# Patient Record
Sex: Male | Born: 1955 | Hispanic: Yes | Marital: Married | Smoking: Former smoker
Health system: Southern US, Community
[De-identification: ages and names within clinical notes are randomized; demographics above are authoritative.]

## PROBLEM LIST (undated history)

## (undated) HISTORY — PX: JOINT REPLACEMENT: SHX530

---

## 2007-03-02 ENCOUNTER — Ambulatory Visit: Payer: Self-pay | Admitting: Family Medicine

## 2012-05-23 ENCOUNTER — Emergency Department: Payer: Self-pay | Admitting: Unknown Physician Specialty

## 2012-05-23 LAB — URINALYSIS, COMPLETE
Bilirubin,UR: NEGATIVE
Glucose,UR: NEGATIVE mg/dL (ref 0–75)
Hyaline Cast: 7
Leukocyte Esterase: NEGATIVE
Nitrite: NEGATIVE
Ph: 6 (ref 4.5–8.0)
Specific Gravity: 1.033 (ref 1.003–1.030)
Squamous Epithelial: NONE SEEN

## 2012-05-23 LAB — COMPREHENSIVE METABOLIC PANEL
Alkaline Phosphatase: 104 U/L (ref 50–136)
Anion Gap: 4 — ABNORMAL LOW (ref 7–16)
Calcium, Total: 9.3 mg/dL (ref 8.5–10.1)
Co2: 34 mmol/L — ABNORMAL HIGH (ref 21–32)
Creatinine: 0.86 mg/dL (ref 0.60–1.30)
EGFR (Non-African Amer.): >60
Osmolality: 275 (ref 275–301)
SGOT(AST): 38 U/L — ABNORMAL HIGH (ref 15–37)
SGPT (ALT): 37 U/L (ref 12–78)
Sodium: 135 mmol/L — ABNORMAL LOW (ref 136–145)

## 2012-05-23 LAB — LIPASE, BLOOD: Lipase: 206 U/L (ref 73–393)

## 2012-05-23 LAB — CBC
HCT: 52.5 % — ABNORMAL HIGH (ref 40.0–52.0)
HGB: 17.5 g/dL (ref 13.0–18.0)
MCH: 27 pg (ref 26.0–34.0)
MCHC: 33.3 g/dL (ref 32.0–36.0)
MCV: 81 fL (ref 80–100)
Platelet: 237 10*3/uL (ref 150–440)

## 2012-05-23 LAB — MAGNESIUM: Magnesium: 3.2 mg/dL — ABNORMAL HIGH

## 2012-05-23 LAB — TROPONIN I: Troponin-I: <0.02 ng/mL

## 2013-05-09 ENCOUNTER — Emergency Department: Payer: Self-pay | Admitting: Emergency Medicine

## 2013-05-09 LAB — CBC
HCT: 46.4 % (ref 40.0–52.0)
HGB: 15.2 g/dL (ref 13.0–18.0)
MCH: 26.8 pg (ref 26.0–34.0)
MCHC: 32.7 g/dL (ref 32.0–36.0)
MCV: 82 fL (ref 80–100)
Platelet: 300 10*3/uL (ref 150–440)
RDW: 14.2 % (ref 11.5–14.5)
WBC: 11.5 10*3/uL — ABNORMAL HIGH (ref 3.8–10.6)

## 2013-05-09 LAB — COMPREHENSIVE METABOLIC PANEL
Anion Gap: 14 (ref 7–16)
Bilirubin,Total: 0.3 mg/dL (ref 0.2–1.0)
Chloride: 103 mmol/L (ref 98–107)
Co2: 21 mmol/L (ref 21–32)
EGFR (Non-African Amer.): 52 — ABNORMAL LOW
Glucose: 237 mg/dL — ABNORMAL HIGH (ref 65–99)
Osmolality: 282 (ref 275–301)
SGPT (ALT): 104 U/L — ABNORMAL HIGH (ref 12–78)
Sodium: 138 mmol/L (ref 136–145)
Total Protein: 8.6 g/dL — ABNORMAL HIGH (ref 6.4–8.2)

## 2013-08-16 ENCOUNTER — Encounter: Payer: Self-pay | Admitting: Orthopedic Surgery

## 2013-09-12 ENCOUNTER — Encounter: Payer: Self-pay | Admitting: Orthopedic Surgery

## 2014-05-05 ENCOUNTER — Emergency Department: Payer: Self-pay | Admitting: Emergency Medicine

## 2014-07-15 HISTORY — PX: DG LEFT WRIST COMPLETE (ARMC HX): HXRAD1564

## 2015-03-14 ENCOUNTER — Emergency Department
Admission: EM | Admit: 2015-03-14 | Discharge: 2015-03-14 | Disposition: A | Discharge status: Home / Self Care | Payer: PRIVATE HEALTH INSURANCE | Attending: Emergency Medicine | Admitting: Emergency Medicine

## 2015-03-14 ENCOUNTER — Encounter: Payer: Self-pay | Admitting: Emergency Medicine

## 2015-03-14 DIAGNOSIS — I83023 Varicose veins of left lower extremity with ulcer of ankle: Secondary | ICD-10-CM | POA: Diagnosis present

## 2015-03-14 DIAGNOSIS — L97329 Non-pressure chronic ulcer of left ankle with unspecified severity: Secondary | ICD-10-CM

## 2015-03-14 NOTE — ED Notes (Addendum)
Non adhesive dressing and kerlex applied to left ankle. Per verbal order Marisue Humble, PA-C

## 2015-03-14 NOTE — Discharge Instructions (Signed)
Venas varicosas sangrantes  (Bleeding Varicose Veins) Las venas varicosas son venas que se han agrandado y se tornan sinuosas. Las vlvulas de las venas ayudan al retorno de la sangre desde las piernas hacia el corazn. Si estas vlvulas sufren una lesin, el flujo sanguneo retorna hacia atrs y [employer] venas de la pierna, cerca de la piel. Esto hace que las venas se agranden debido al aumento de la presin interior. En algunos casos las venas sangran.  CAUSAS  Los factores que pueden causar sangrado en las venas varicosas son:   Adelgazamiento de la piel que cubre las venas. Esta piel se estira a medida que las venas se agrandan.  Debilidad y adelgazamiento de las paredes de las venas varicosas. Estas paredes delgadas son Neomia Dear de las causas por las que la sangre no fluye normalmente hacia corazn.  Presin alta en las venas. La alta presin se debe a que la sangre no fluye libremente Special educational needs teacher.  Traumatismos. Incluso una pequea lesin en una vena varicosa puede producir sangrado.  Heridas abiertas. En la zona de una vena varicosa puede formarse una lcera y no curarse. Esto favorece el sangrado.  Tomar anticoagulantes. Entre ellos se incluye la aspirina, los medicamentos antiinflamatorios y Health and safety inspector. SNTOMAS  Si el sangrado ocurre en la superficie exterior de la piel, la sangre puede verse. A veces, el sangrado ocurre debajo de la piel. En este caso, podr observarse una zona azul o prpura que se extender ms all de la vena. Este cambio del color puede ser visible.  DIAGNSTICO  Para diagnosticar una vena varicosa que sangra, el mdico:   Preguntar acerca de sus sntomas. Cundo fue la primera Publishing copy.  Durante cunto tiempo ha tenido vrices y Chief Technology Officer causan problemas.  Preguntar acerca de su salud en general.  Tambin sobre las posibles causas, como cortes recientes o si se ha golpeado o lastimado cerca de las venas varicosas.  Examinar la  piel o la pierna que le preocupa. Palpar las venas.  Solicitar estudios de diagnstico por imgenes. Con este estudio podr observarse una imagen detallada de las venas. TRATAMIENTO  El Civil engineer, contracting del tratamiento de las varices que sangran es detener el sangrado. El objetivo es impedir que el sangrado vuelva a Scientific laboratory technician. El tratamiento depender de la causa de la hemorragia y su gravedad. Pregunte a su mdico qu sera lo mejor para usted. Las opciones incluyen:   Lexicographer (elevar) la pierna. Acostarse con la pierna apoyada en una almohada o cojn. El pie debe estar por encima del nivel del corazn.  Aplicar presin en la zona que est sangrando. El sangrado debe detenerse en un corto tiempo.  El uso de medias elsticas para "comprimir" las piernas (medias de compresin). Una venda elstica podra tener el mismo [place].  Aplicacin de una crema antibitica en las lceras que no cicatrizan.  La eliminacin [place] o cierre de las venas varicosas que sangran. INSTRUCCIONES PARA EL CUIDADO EN EL HOGAR   Aplique las cremas que su mdico le haya recetado. Siga cuidadosamente las indicaciones.  Use las medias de compresin o cualquier vendaje especial que se le indique. Asegrese de saber:  Si debe usarlas todos los [place].  Durante cunto tiempo tendr [employer].  Si las venas se eliminaron o se cerraron, tendr un vendaje (apsito ) cubriendo la zona. Asegrese de saber:  Con qu frecuencia debe cambiar el apsito.  Si la zona se puede mojar.  Cuando usted puede dejar la piel al  descubierto.  Revise la [employer]. Observe si aparecen nuevas llagas y signos de sangrado.  Para prevenir futuras hemorragias:  Tenga especial cuidado en situaciones en las que se podra lastimar las piernas. Por ejemplo, durante la depilacin, o si trabaja en el exterior o en el jardn.  Trate de [employer] piernas elevadas el mayor tiempo posible. Recustese siempre que  pueda. SOLICITE ATENCIN MDICA SI:   Usted tiene alguna duda sobre cmo usar medias de compresin o las vendas elsticas.  Las venas continan sangrando.  Aparecen lceras cerca de las venas varicosas.  Tiene una lcera que no se [place] o se agranda.  El dolor en la pierna [place].  El rea alrededor de la vena varicosa est   caliente, roja o sensible al tacto.  Supura un lquido amarillento que huele mal en el lugar donde estaba sangrando.  La temperatura eleva a ms de 100.5 F (38.1 C). SOLICITE ATENCIN MDICA DE INMEDIATO SI:   La temperatura eleva a ms de 102 F (38,9 C). Document Released: 03/25/2012 [facility] Patient Information 2015 [place], [place]. This information is not intended to replace advice given to you by your health care provider. Make sure you discuss any questions you have with your health care provider.  lceras por estasis (Stasis Ulcer) Este tipo de lceras se produce en las piernas cuando existe algn problema circulatorio. Una lcera es como un pequeo orificio en la piel.  CAUSAS  Las lceras por estasis aparecen cuando las venas no funcionan adecuadamente. Las venas tienen vlvulas que ayudan a que la sangre retorne al corazn. Si estas vlvulas sufren una lesin, el flujo sanguneo retorna hacia atrs y [employer] venas de la pierna, cerca de la piel. Este trastorno hace que las venas se agranden debido al aumento de la presin y pueden causar una lcera por estasis.  SNTOMAS   LLagas no muy profundas (superficiales) en la piel.  Supuracin de un lquido claro por la llaga.  Dolor en las piernas o sensacin de pesadez. Estos sntomas pueden empeorar hacia el final del da.  Hinchazn de las piernas.  Cambios en el color de la piel. DIAGNSTICO  El mdico realizar el diagnstico haciendo un examen en la pierna. El mdico indicar exmenes como ecografas u otros estudios para evaluar el flujo de sangre en la pierna.  INSTRUCCIONES PARA  EL CUIDADO DOMICILIARIO  No permanezca sentado o de pie en la misma posicin durante largos perodos. No se siente con las piernas cruzadas. Descanse con las piernas Conservator, museum/gallery. En lo posible, eleve la pierna por arriba del nivel del corazn, durante 30 minutos, 3 a 4 veces por da.  Use medias elsticas o de soporte. No use prendas apretadas alrededor de las piernas, pelvis o cintura. Esto ocasiona el aumento de la presin en las venas. Si el profesional le ha aplicado vendas de compresin, utilcelas segn las indicaciones.  Camine todo lo posible para aumentar el flujo de sangre. Si realiza viajes largos en automvil o avin, levntese y camine [address]. Si no toma aspirina, tome una aspirina infantil antes de los [address], a menos que tenga razones mdicas que se lo prohiban.  Levante los pies de la cama por la noche alrededor de 5 cm. Consulte esto con el profesional que lo asiste, ya que no es recomendable si usted sufre insuficiencia cardaca o problemas respiratorios.  Si se ha lastimado Artist de la vena y sangra, recustese con la  pierna elevada y presione con un pao limpio hasta que la sangre se [address]. Haga presin sobre el corte hasta que la sangre se [place]. Luego coloque un vendaje sobre el corte. Consulte a su mdico si contina sangrando o necesita una sutura. Consulte al profesional que lo asiste si tiene infeccin. Los signos de infeccin son fiebre,enrojecimiento, aumento del dolor y secrecin de pus.  Si el profesional que lo [employer] pide que concurra a una cita de seguimiento, es importante asistir a ella. No concurrir a la Holiday representative como consecuencia una lesin crnica o [place], dolor, e incapacidad. Si tiene algn problema para asistir a la cita, debe comunicarse con el establecimiento para obtener asistencia. SOLICITE ATENCIN MDICA DE INMEDIATO SI:  La zona ulcerosa comienza a abrirse.  Presenta dolor,  enrojecimiento, sensibilidad o hinchazn en la pierna sobre la vena o cerca de la lcera.  Siente molestias y Radiographer, therapeutic pierna.  Desarrolla fiebre sin otro motivo.  Siente falta de aire o Journalist, newspaper. Document Released: 04/10/2005 Document Revised: 07/06/2013 [facility] Patient Information 2015 [place], [place]. This information is not intended to replace advice given to you by your health care provider. Make sure you discuss any questions you have with your health care provider.  Keep the wound clean, dry, and covered. Follow-up with the wound care clinic for ulcer care and management.   Mantenga la herida limpia , seca y [place] . Siga con la clnica de cuidado de heridas para el cuidado y tratamiento de las lceras.

## 2015-03-14 NOTE — ED Notes (Addendum)
Patient ambulatory to triage with steady gait, without difficulty or distress noted; pt reports had varicose vein to inside left ankle that burst twice, was cauterized; later ulcerated area noted to inside left ankle for several weeks; has been seen at urgent care prior and completed antibiotics 2wks ago but area persists; discolored areas noted but no redness or swelling; +PP, brisk cap refill, W&D

## 2015-03-14 NOTE — ED Notes (Addendum)
Patient ambulated to treatment room with steady gait. Patient reports had varicose vein to inside left ankle that burst and was seen at urgent care, per patient states "I was given some cream and bandaged" Was given 1 week dose of antibiotics, completed dose but reports symptoms did not resolve. Reports wraps ankle with bandage and cotton balls, works 12 hour shifts standing. Presents to day with ulcerated area to left ankle, cool to touch, no swelling or drainage noted. Denies fevers, chest pain, abd pain, or other complaints. Patient alert and oriented x 4, no increased work in breathing noted.

## 2015-03-14 NOTE — ED Notes (Signed)

## 2015-03-16 NOTE — ED Provider Notes (Signed)
[facility] Emergency Department Provider Note ____________________________________________  Time seen: 2243 I have reviewed the triage vital signs and the nursing notes.  HISTORY  Chief Complaint  No chief complaint on file.  History limited by [place] language. Missy Sabins, RN present for interview and exam.   HPI [name] is a 59 y.o. male reports to the ED with his adult daughter for evaluation ofa chronic wound to the inside of his left ankle. 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 He has also completed a recent course of antibiotics for the chronic stasis ulcer. He was referred to the wound care clinic, but according to his daughter, has failed to follow-up. He denies any fever, chills, sweats, but is here for wound evaluation.  History reviewed. No pertinent past medical history.  There are no active problems to display for this patient.  History reviewed. No pertinent past surgical history.  No current outpatient prescriptions on file.  Allergies Review of patient's allergies indicates no known allergies.  No family history on file.  Social History Social History  Substance Use Topics  . Smoking status: Never Smoker   . Smokeless tobacco: None  . Alcohol Use: No   Review of Systems  Constitutional: Negative for fever. Eyes: Negative for visual changes. ENT: Negative for sore throat. Cardiovascular: Negative for chest pain. Respiratory: Negative for shortness of breath. Gastrointestinal: Negative for abdominal pain, vomiting and diarrhea. Genitourinary: Negative for dysuria. Musculoskeletal: Negative for back pain. Skin: Negative for rash. Chronic wound as above Neurological: Negative for headaches, focal weakness or numbness. ____________________________________________  PHYSICAL EXAM:  VITAL SIGNS: ED Triage Vitals  Enc Vitals Group      BP 03/14/15 2143 124/83 mmHg     Pulse Rate 03/14/15 2143 64     Resp 03/14/15 2143 18     Temp 03/14/15 2143 97.9 F (36.6 C)     Temp Source 03/14/15 2143 Oral     SpO2 03/14/15 2143 97 %     Weight 03/14/15 2143 160 lb (72.576 kg)     Height 03/14/15 2143 6' (1.829 m)     Head Cir --      Peak Flow --      Pain Score 03/14/15 2140 7     Pain Loc --      Pain Edu? --      Excl. in GC? --    Constitutional: Alert and oriented. Well appearing and in no distress. Eyes: Conjunctivae are normal. PERRL. Normal extraocular movements. ENT   Head: Normocephalic and atraumatic.   Nose: No congestion/rhinnorhea.   Mouth/Throat: Mucous membranes are moist.   Neck: Supple. No thyromegaly. Hematological/Lymphatic/Immunilogical: No cervical lymphadenopathy. Cardiovascular: Normal rate, regular rhythm.  Respiratory: Normal respiratory effort. No wheezes/rales/rhonchi. Gastrointestinal: Soft and nontender. No distention. Musculoskeletal: Nontender with normal range of motion in all extremities.  Neurologic:  Normal gait without ataxia. Normal speech and language. No gross focal neurologic deficits are appreciated. Skin:  Skin is warm, dry and intact. No rash noted. Left medial ankle with chronic, granulated, stasis ulcer with rolled, pink edges. No active drainage, local erythema, or sign of infection. Psychiatric: Mood and affect are normal. Patient exhibits appropriate insight and judgment. ____________________________________________  PROCEDURES  None a hand dressing applied by the nurse. ____________________________________________  INITIAL IMPRESSION / ASSESSMENT AND PLAN / ED COURSE  Reassurance to patient and family about the chronic nature of the stasis ulcer. Suggests  and strongly urged follow the wound care center for ongoing wound care management. The wound is not currently present as an acutely infected ulcer. Suggest dry sterile dressing until  follow-up. ____________________________________________  FINAL CLINICAL IMPRESSION(S) / ED DIAGNOSES  Final diagnoses:  Stasis ulcer of ankle, left     Lissa Hoard, PA-C 03/16/15 0117  Myrna Blazer, MD 03/18/15 [phone]

## 2015-03-22 ENCOUNTER — Encounter: Payer: PRIVATE HEALTH INSURANCE | Attending: Surgery | Admitting: Surgery

## 2015-03-22 DIAGNOSIS — I83223 Varicose veins of left lower extremity with both ulcer of ankle and inflammation: Secondary | ICD-10-CM | POA: Insufficient documentation

## 2015-03-22 DIAGNOSIS — L97322 Non-pressure chronic ulcer of left ankle with fat layer exposed: Secondary | ICD-10-CM | POA: Diagnosis not present

## 2015-03-23 NOTE — Progress Notes (Signed)
[name], [name] ([id number]) Visit Report for 03/22/2015 Chief Complaint Document Details [name], Date of Service: 03/22/2015 3:15 PM Patient Name: [name] Patient Account Number: [id number] Medical Record Treating RN: Huel Coventry [id number] Number: Other Clinician: Date of Birth/Sex: 1956/03/31 (59 y.o. Male) Treating BURNS III, Primary Care Physician: Physician/Extender: Zollie Beckers Referring Physician: Bayard Males Weeks in Treatment: 0 Information Obtained from: Patient Chief Complaint Chronic left calf ulcer. Electronic Signature(s) Signed: 03/22/2015 4:02:17 PM By: Madelaine Bhat MD Entered By: Madelaine Bhat on 03/22/2015 15:54:57 [name], [name] ([id number]) -------------------------------------------------------------------------------- Debridement Details [name], Date of Service: 03/22/2015 3:15 PM Patient Name: [name] Patient Account Number: [id number] Medical Record Treating RN: Huel Coventry [id number] Number: Other Clinician: Date of Birth/Sex: March 30, 1956 (59 y.o. Male) Treating BURNS III, Primary Care Physician: Physician/Extender: Zollie Beckers Referring Physician: Bayard Males Weeks in Treatment: 0 Debridement Performed for Wound #1 Left,Medial Malleolus Assessment: Performed By: Physician BURNS III, Melanie Crazier., MD Debridement: Debridement Pre-procedure Yes Verification/Time Out Taken: Start Time: 15:41 Pain Control: Other : lidociane 4% Level: Skin/Subcutaneous Tissue Total Area Debrided (L x 1.8 (cm) x 1 (cm) = 1.8 (cm) W): Tissue and other Viable, Non-Viable, Exudate, Fat, Fibrin/Slough, Subcutaneous material debrided: Instrument: Curette Bleeding: Minimum Hemostasis Achieved: Pressure End Time: 15:43 Procedural Pain: 0 Post Procedural Pain: 0 Response to Treatment: Procedure was tolerated well Post Debridement Measurements of Total Wound Length: (cm) 1.8 Width: (cm) 1 Depth: (cm) 0.2 Volume: (cm) 0.283 Post Procedure  Diagnosis Same as Pre-procedure Electronic Signature(s) Signed: 03/22/2015 4:02:17 PM By: Madelaine Bhat MD Signed: 03/22/2015 4:22:57 PM By: Elliot Gurney RN, BSN, Kim RN, BSN Entered By: Madelaine Bhat on 03/22/2015 15:54:06 [name], [name] ([id number]) -------------------------------------------------------------------------------- HPI Details [name], Date of Service: 03/22/2015 3:15 PM Patient Name: [name] Patient Account Number: [id number] Medical Record Treating RN: Huel Coventry [id number] Number: Other Clinician: Date of Birth/Sex: February 22, 1956 (59 y.o. Male) Treating BURNS III, Primary Care Physician: Physician/Extender: Zollie Beckers Referring Physician: Bayard Males Weeks in Treatment: 0 History of Present Illness HPI Description: Pleasant 59 year old with no significant past medical history. No history of diabetes or peripheral arterial disease. History obtained from interpreter. He says that he developed a spontaneous ulceration on his left medial ankle in July 2016. He thinks that it was secondary to a "ruptured vein". He denies any history of venous stasis ulcerations or of DVT. No ultrasound evaluation. Does not wear compression. Ambulating per his baseline. 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 No antibiotics. He denies any significant pain at this time. No fever or chills. No significant drainage. Electronic Signature(s) Signed: 03/22/2015 4:02:17 PM By: Madelaine Bhat MD Entered By: Madelaine Bhat on 03/22/2015 15:59:14 [name], [name] ([id number]) -------------------------------------------------------------------------------- Physical Exam Details [name], Date of Service: 03/22/2015 3:15 PM Patient Name: [name] Patient Account Number: [id number] Medical Record Treating RN: Huel Coventry [id number] Number: Other Clinician: Date of Birth/Sex: Sep 09, 1955 (58 y.o. Male) Treating BURNS III, Primary  Care Physician: Physician/Extender: Zollie Beckers Referring Physician: Bayard Males Weeks in Treatment: 0 Constitutional . Pulse regular. Respirations normal and unlabored. Afebrile. Marland Kitchen Respiratory WNL. No retractions.. Cardiovascular Pedal Pulses WNL. Integumentary (Hair, Skin) .Marland Kitchen Neurological Sensation normal to touch, pin,and vibration. Psychiatric Judgement and insight Intact.. Oriented times 3.. No evidence of depression, anxiety, or agitation.. Notes Left medial ankle ulceration. Full-thickness. No exposed deep structures. No cellulitis. Surrounding hyperpigmentation. Multiple prominent varicosities in the distal calf and ankle. 1+ pitting edema. Palpable DP. ABI falsely elevated at 1.6. Electronic Signature(s)  Signed: 03/22/2015 4:02:17 PM By: Madelaine Bhat MD Entered By: Madelaine Bhat on 03/22/2015 16:00:44 [name], [name] ([id number]) -------------------------------------------------------------------------------- Physician Orders Details [name], Date of Service: 03/22/2015 3:15 PM Patient Name: [name] Patient Account Number: [id number] Medical Record Treating RN: Huel Coventry [id number] Number: Other Clinician: Date of Birth/Sex: 21-Sep-1955 (59 y.o. Male) Treating BURNS III, Primary Care Physician: Physician/Extender: Zollie Beckers Referring Physician: Bayard Males Weeks in Treatment: 0 Verbal / Phone Orders: Yes Clinician: Huel Coventry Read Back and Verified: Yes Diagnosis Coding Wound Cleansing Wound #1 Left,Medial Malleolus o Clean wound with Normal Saline. Anesthetic Wound #1 Left,Medial Malleolus o Topical Lidocaine 4% cream applied to wound bed prior to debridement Primary Wound Dressing Wound #1 Left,Medial Malleolus o Prisma Ag Secondary Dressing Wound #1 Left,Medial Malleolus o Boardered Foam Dressing Dressing Change Frequency Wound #1 Left,Medial Malleolus o Change dressing every other day. Follow-up Appointments Wound #1  Left,Medial Malleolus o Return Appointment in 2 weeks. Edema Control Wound #1 Left,Medial Malleolus o Tubigrip Notes Juxtalite Electronic Signature(s) [name], [name] ([id number]) Signed: 03/22/2015 4:02:17 PM By: Madelaine Bhat MD Signed: 03/22/2015 4:22:57 PM By: Elliot Gurney RN, BSN, Kim RN, BSN Entered By: Elliot Gurney, RN, BSN, Kim on 03/22/2015 15:52:58 [name], [name] ([id number]) -------------------------------------------------------------------------------- Problem List Details [name], Date of Service: 03/22/2015 3:15 PM Patient Name: [name] Patient Account Number: [id number] Medical Record Treating RN: Huel Coventry [id number] Number: Other Clinician: Date of Birth/Sex: May 11, 1956 (59 y.o. 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 Male) Treating BURNS III, Primary Care Physician: Physician/Extender: Zollie Beckers Referring Physician: Bayard Males Weeks in Treatment: 0 Subjective Chief Complaint Information obtained from Patient Chronic left calf ulcer. History of Present  Illness (HPI) Pleasant 59 year old with no significant past medical history. No history of diabetes or peripheral arterial disease. History obtained from interpreter. He says that he developed a spontaneous ulceration on his left medial ankle in July 2016. He thinks that it was secondary to a "ruptured vein". He denies any history of venous stasis ulcerations or of DVT. No ultrasound evaluation. Does not wear compression. Ambulating per his baseline. 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 No antibiotics. He denies any significant pain at this time. No fever or chills. No significant drainage. Wound History Patient presents with 1 open wound that has been present for approximately 4 weeks. Patient has been treating wound in the following manner: nail polish. Laboratory tests have not been performed in the last month. Patient reportedly has not tested positive for an antibiotic resistant organism. Patient reportedly has not tested positive for osteomyelitis. Patient reportedly has not had testing performed to evaluate circulation in the legs. Patient History Information obtained from Patient. Allergies No Known Drug Allergies Family History No family history of Cancer, Diabetes, Heart Disease, Hypertension, Kidney Disease, Lung Disease, Stroke, Thyroid Problems. Social  History [name], [name] ([id number]) Never smoker, Marital Status - Married, Alcohol Use - Never, Drug Use - No History, Caffeine Use - Never. Review of Systems (ROS) Constitutional Symptoms (General Health) The patient has no complaints or symptoms. Eyes The patient has no complaints or symptoms. Ear/Nose/Mouth/Throat The patient has no complaints or symptoms. Hematologic/Lymphatic The patient has no complaints or symptoms. Respiratory The patient has no complaints or symptoms. Cardiovascular The patient has no complaints or symptoms. Gastrointestinal The  patient has no complaints or symptoms. Endocrine The patient has no complaints or symptoms. Genitourinary The patient has no complaints or symptoms. Immunological The patient has no complaints or symptoms. Integumentary (Skin) Complains or has symptoms of Wounds. Musculoskeletal The patient has no complaints or symptoms. Neurologic The patient has no complaints or symptoms. Oncologic The patient has no complaints or symptoms. Psychiatric The patient has no complaints or symptoms. Objective Constitutional Pulse regular. Respirations normal and unlabored. Afebrile. Vitals Time Taken: 3:15 PM, Height: 76 in, Weight: 175 lbs, BMI: 21.3, Temperature: 97.8 F, Pulse: 70 bpm, Respiratory Rate: 18 breaths/min, Blood Pressure: 111/65 mmHg. Respiratory WNL. No retractions.[name], [name] ([id number]) Cardiovascular Pedal Pulses WNL. Neurological Sensation normal to touch, pin,and vibration. Psychiatric Judgement and insight Intact.. Oriented times 3.. No evidence of depression, anxiety, or agitation.. General Notes: Left medial ankle ulceration. Full-thickness. No exposed deep structures. No cellulitis. Surrounding hyperpigmentation. Multiple prominent varicosities in the distal calf and ankle. 1+ pitting edema. Palpable DP. ABI falsely elevated at 1.6. Integumentary (Hair, Skin) Wound #1 status is Open. Original cause of wound was Gradually Appeared. The wound is located on the Left,Medial Malleolus. The wound measures 1.8cm length x 1cm width x 0.1cm depth; 1.414cm^2 area and 0.141cm^3 volume. The wound is limited to skin breakdown. There is a small amount of serous drainage noted. The wound margin is indistinct and nonvisible. There is no granulation within the wound bed. There is a large (67-100%) amount of necrotic tissue within the wound bed including Adherent Slough. The periwound skin appearance did not exhibit: Callus, Crepitus, Excoriation, Fluctuance, Friable,  Induration, Localized Edema, Rash, Scarring, Dry/Scaly, Maceration, Moist, Atrophie Blanche, Cyanosis, Ecchymosis, Hemosiderin Staining, Mottled, Pallor, Rubor, Erythema. 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 Procedures Wound #1 Wound #1 is a Venous Leg Ulcer located on the Left,Medial Malleolus . There was a Skin/Subcutaneous [name], [name] ([id number]) Tissue Debridement [phone]) debridement with total area of 1.8 sq cm performed by BURNS III, Melanie Crazier., MD. with the following instrument(s): Curette to remove Viable and Non-Viable tissue/material including Exudate, Fat, Fibrin/Slough, and Subcutaneous after achieving pain control using Other (lidociane 4%). A time out was conducted prior to the start of the procedure. A Minimum amount of bleeding was controlled with Pressure. The procedure was tolerated well with a pain level of 0 throughout and a pain level of 0 following the procedure. Post Debridement Measurements: 1.8cm length x 1cm width x 0.2cm depth; 0.283cm^3 volume. Post procedure Diagnosis Wound #1: Same as Pre-Procedure Plan Wound Cleansing: Wound #1 Left,Medial Malleolus: Clean wound with Normal Saline. Anesthetic: Wound #1 Left,Medial Malleolus: Topical Lidocaine 4% cream applied to wound bed prior to debridement Primary Wound Dressing: Wound #1 Left,Medial Malleolus: Prisma Ag Secondary Dressing: Wound #1 Left,Medial Malleolus: Boardered Foam Dressing Dressing Change Frequency: Wound #1 Left,Medial Malleolus: Change dressing every other day.  Follow-up Appointments: Wound #1 Left,Medial Malleolus: Return Appointment in 2 weeks. Edema Control: Wound #1 Left,Medial Malleolus: Tubigrip General Notes: Juxtalite Promogran Prisma dressing  changes. Tubigrip for edema control. Order juxtalite. I don't think compression bandages are the best option for him at this time given his line of work. Electronic Signature(s) [name], [name] ([id number]) Signed: 03/22/2015 4:02:17 PM By: Madelaine Bhat MD Entered By: Madelaine Bhat on 03/22/2015 16:01:30 [name], [name] ([id number]) -------------------------------------------------------------------------------- ROS/PFSH Details [name], Date of Service: 03/22/2015 3:15 PM Patient Name: [name] Patient Account Number: [id number] Medical Record Treating RN: Huel Coventry [id number] Number: Other Clinician: Date of Birth/Sex: Oct 28, 1955 (59 y.o. Male) Treating BURNS III, Primary Care Physician: Physician/Extender: Zollie Beckers Referring Physician: Bayard Males Weeks in Treatment: 0 Label Progress Note Print Version as History and Physical for this encounter Information Obtained From Patient Wound History Do you currently have one or more open woundso Yes How many open wounds do you currently haveo 1 Approximately how long have you had your woundso 4 weeks How have you been treating your wound(s) until nowo nail polish Has your wound(s) ever healed and then re-openedo No Have you had any lab work done in the past montho No Have you tested positive for an antibiotic resistant organism (MRSA, VRE)o No Have you tested positive for osteomyelitis (bone infection)o No Have you had any tests for circulation on your legso No Integumentary (Skin) Complaints and Symptoms: Positive for: Wounds Constitutional Symptoms (General Health) Complaints and Symptoms: No Complaints or Symptoms Eyes Complaints and Symptoms: No Complaints or Symptoms Ear/Nose/Mouth/Throat Complaints and Symptoms: No Complaints or Symptoms Hematologic/Lymphatic Complaints and Symptoms: No Complaints or Symptoms Respiratory [name], [name] ([id number]) Complaints and Symptoms: No Complaints or  Symptoms Cardiovascular Complaints and Symptoms: No Complaints or Symptoms Gastrointestinal Complaints and Symptoms: No Complaints or Symptoms Endocrine Complaints and Symptoms: No Complaints or Symptoms Genitourinary Complaints and Symptoms: No Complaints or Symptoms Immunological Complaints and Symptoms: No Complaints or Symptoms Musculoskeletal Complaints and Symptoms: No Complaints or Symptoms Neurologic Complaints and Symptoms: No Complaints or Symptoms Oncologic Complaints and Symptoms: No Complaints or Symptoms Psychiatric Complaints and Symptoms: No Complaints or Symptoms Family and Social History Cancer: No; Diabetes: No; Heart Disease: No; Hypertension: No; Kidney Disease: No; Lung Disease: No; Stroke: No; Thyroid Problems: No; Never smoker; Marital Status - Married; Alcohol Use: Never; Drug Use: No History; Caffeine Use: Never; Living Will: No; Medical Power of Attorney: No [name], [name] ([id number]) Physician Affirmation I have reviewed and agree with the above information. Electronic Signature(s) Signed: 03/22/2015 4:02:17 PM By: Madelaine Bhat MD Signed: 03/22/2015 4:22:57 PM By: Elliot Gurney RN, BSN, Kim RN, BSN Entered By: Madelaine Bhat on 03/22/2015 15:54:29 [name], [name] ([id number]) -------------------------------------------------------------------------------- SuperBill Details [name], Date of Service: 03/22/2015 Patient Name: [name] Patient Account Number: [id number] Medical Record Treating RN: Huel Coventry [id number] Number: Other Clinician: Date of Birth/Sex: 24-Nov-1955 (59 y.o. 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 L97.322 Non-pressure chronic ulcer of left ankle with fat  layer exposed Physician Procedures CPT4: Description Modifier Quantity Code [id number] WC PHYS LEVEL 3 o NEW PT 1 ICD-10 Description Diagnosis 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

## 2015-03-23 NOTE — Progress Notes (Addendum)
[name] ([id number]) Visit Report for 03/22/2015 Allergy List Details Patient Name: [name] Date of Service: 03/22/2015 3:15 PM Medical Record Number: [id number] Patient Account Number: [id number] Date of Birth/Sex: July 28, 1955 (59 y.o. Male) Treating RN: Huel Coventry Primary Care Physician: Other Clinician: Referring Physician: Bayard Males Treating Physician/Extender: BURNS III, Regis Bill in Treatment: 0 Allergies Active Allergies No Known Drug Allergies Allergy Notes Electronic Signature(s) Signed: 03/22/2015 4:22:57 PM By: Elliot Gurney, RN, BSN, Kim RN, BSN Entered By: Elliot Gurney, RN, BSN, Kim on 03/22/2015 15:25:59 [name] ([id number]) -------------------------------------------------------------------------------- Arrival Information Details Patient Name: [name] Date of Service: 03/22/2015 3:15 PM Medical Record Number: [id number] Patient Account Number: [id number] Date of Birth/Sex: 07-10-1956 (59 y.o. Male) Treating RN: Huel Coventry Primary Care Physician: Other Clinician: Referring Physician: Bayard Males Treating Physician/Extender: BURNS III, Regis Bill in Treatment: 0 Visit Information Patient Arrived: Ambulatory Arrival Time: 15:10 Accompanied By: daughter Transfer Assistance: None Patient Identification Verified: Yes Secondary Verification Process Yes Completed: Patient Requires Transmission-Based No Precautions: Patient Has Alerts: No Electronic Signature(s) Signed: 03/22/2015 4:22:57 PM By: Elliot Gurney, RN, BSN, Kim RN, BSN Entered By: Elliot Gurney, RN, BSN, Kim on 03/22/2015 15:13:26 [name] ([id number]) -------------------------------------------------------------------------------- Clinic Level of Care Assessment Details Patient Name: [name] Date of Service: 03/22/2015 3:15 PM Medical Record Number: [id number] Patient Account Number: [id number] Date of Birth/Sex: May 03, 1956 (59 y.o. Male) Treating RN:  Huel Coventry Primary Care Physician: Other Clinician: Referring Physician: Bayard Males Treating Physician/Extender: BURNS III, Regis Bill in Treatment: 0 Clinic Level of Care Assessment Items TOOL 1 Quantity Score []  - Use when EandM and Procedure is performed on INITIAL visit 0 ASSESSMENTS - Nursing Assessment / Reassessment X - General Physical Exam (combine w/ comprehensive assessment (listed just 1 20 below) when performed on new pt. evals) X - Comprehensive Assessment (HX, ROS, Risk Assessments, Wounds Hx, etc.) 1 25 ASSESSMENTS - Wound and Skin Assessment / Reassessment []  - Dermatologic / Skin Assessment (not related to wound area) 0 ASSESSMENTS - Ostomy and/or Continence Assessment and Care []  - Incontinence Assessment and Management 0 []  - Ostomy Care Assessment and Management (repouching, etc.) 0 PROCESS - Coordination of Care X - Simple Patient / Family Education for ongoing care 1 15 []  - Complex (extensive) Patient / Family Education for ongoing care 0 X - Staff obtains Consents, Records, Test Results / Process Orders 1 10 []  - Staff telephones HHA, Nursing Homes / Clarify orders / etc 0 []  - Routine Transfer to another Facility (non-emergent condition) 0 []  - Routine Hospital Admission (non-emergent condition) 0 []  - New Admissions / Manufacturing engineer / Ordering NPWT, Apligraf, etc. 0 []  - Emergency Hospital Admission (emergent condition) 0 PROCESS - Special Needs []  - Pediatric / Minor Patient Management 0 []  - Isolation Patient Management 0 [name] ([id number]) []  - Hearing / Language / Visual special needs 0 []  - Assessment of Community assistance (transportation, D/C planning, etc.) 0 []  - Additional assistance / Altered mentation 0 []  - Support Surface(s) Assessment (bed, cushion, seat, etc.) 0 INTERVENTIONS - Miscellaneous []  - External ear exam 0 []  - Patient Transfer (multiple staff / Nurse, adult / Similar devices) 0 []  - Simple  Staple / Suture removal (25 or less) 0 []  - Complex Staple / Suture removal (26 or more) 0 []  - Hypo/Hyperglycemic Management (do not check if billed separately) 0 X - Ankle / Brachial Index (ABI) - do not check if billed separately 1 15 Has the patient been seen at  the hospital within the last three years: Yes Total Score: 85 Level Of Care: New/Established - Level 3 Electronic Signature(s) Signed: 03/22/2015 4:22:57 PM By: Elliot Gurney, RN, BSN, Kim RN, BSN Entered By: Elliot Gurney, RN, BSN, Kim on 03/22/2015 15:53:18 [name] ([id number]) -------------------------------------------------------------------------------- Encounter Discharge Information Details Patient Name: [name] Date of Service: 03/22/2015 3:15 PM Medical Record Number: [id number] Patient Account Number: [id number] Date of Birth/Sex: 08/16/1955 (59 y.o. Male) Treating RN: Huel Coventry Primary Care Physician: Other Clinician: Referring Physician: Bayard Males Treating Physician/Extender: BURNS III, Regis Bill in Treatment: 0 Encounter Discharge Information Items Schedule Follow-up Appointment: No Medication Reconciliation completed No and provided to Patient/Care Lacey Wallman: Provided on Clinical Summary of Care: 03/22/2015 Form Type Recipient Paper Patient IGA Electronic Signature(s) Signed: 03/22/2015 3:52:59 PM By: Gwenlyn Perking Entered By: Gwenlyn Perking on 03/22/2015 15:52:59 [name] ([id number]) -------------------------------------------------------------------------------- Lower Extremity Assessment Details Patient Name: [name] Date of Service: 03/22/2015 3:15 PM Medical Record Number: [id number] Patient Account Number: [id number] Date of Birth/Sex: [birth year] (59 y.o. Male) Treating RN: Huel Coventry Primary Care Physician: Other Clinician: Referring Physician: Bayard Males Treating Physician/Extender: BURNS III, WALTER Weeks in Treatment: 0 Vascular  Assessment Pulses: Posterior Tibial Palpable: [Left:Yes] Dorsalis Pedis Palpable: [Left:Yes] Extremity colors, hair growth, and conditions: Extremity Color: [Left:Normal] Hair Growth on Extremity: [Left:Yes] Temperature of Extremity: [Left:Warm] Capillary Refill: [Left:< 3 seconds] Blood Pressure: Brachial: [Left:80] Dorsalis Pedis: 130 [Left:Dorsalis Pedis:] Ankle: Posterior Tibial: [Left:Posterior Tibial: 1.63] Toe Nail Assessment Left: Right: Thick: Yes Discolored: Yes Deformed: No Improper Length and Hygiene: No Electronic Signature(s) Signed: 03/22/2015 4:22:57 PM By: Elliot Gurney, RN, BSN, Kim RN, BSN Entered By: Elliot Gurney, RN, BSN, Kim on 03/22/2015 15:21:02 [name] ([id number]) -------------------------------------------------------------------------------- Multi Wound Chart Details Patient Name: [name] Date of Service: 03/22/2015 3:15 PM Medical Record Number: [id number] Patient Account Number: [id number] Date of Birth/Sex: 07-12-1956 (59 y.o. Male) Treating RN: Huel Coventry Primary Care Physician: Other Clinician: Referring Physician: Bayard Males Treating Physician/Extender: BURNS III, WALTER Weeks in Treatment: 0 Vital Signs Height(in): 76 Pulse(bpm): 70 Weight(lbs): 175 Blood Pressure 111/65 (mmHg): Body Mass Index(BMI): 21 Temperature(F): 97.8 Respiratory Rate 18 (breaths/min): Photos: [1:No Photos] [N/A:N/A] Wound Location: [1:Left Malleolus - Medial] [N/A:N/A] Wounding Event: [1:Gradually Appeared] [N/A:N/A] Primary Etiology: [1:Venous Leg Ulcer] [N/A:N/A] Date Acquired: [1:02/14/2015] [N/A:N/A] Weeks of Treatment: [1:0] [N/A:N/A] Wound Status: [1:Open] [N/A:N/A] Measurements L x W x D 1.8x1x0.1 [N/A:N/A] (cm) Area (cm) : [1:1.414] [N/A:N/A] Volume (cm) : [1:0.141] [N/A:N/A] % Reduction in Area: [1:0.00%] [N/A:N/A] % Reduction in Volume: 0.00% [N/A:N/A] Classification: [1:Partial Thickness] [N/A:N/A] Exudate Amount:  [1:Small] [N/A:N/A] Exudate Type: [1:Serous] [N/A:N/A] Exudate Color: [1:amber] [N/A:N/A] Wound Margin: [1:Indistinct, nonvisible] [N/A:N/A] Granulation Amount: [1:None Present (0%)] [N/A:N/A] Necrotic Amount: [1:Large (67-100%)] [N/A:N/A] Exposed Structures: [1:Fascia: No Fat: No Tendon: No Muscle: No Joint: No Bone: No Limited to Skin Breakdown] [N/A:N/A] Debridement: [1:Debridement ([id number])] [N/A:N/A] Time-Out Taken: Yes N/A N/A Pain Control: Other N/A N/A Tissue Debrided: Fibrin/Slough, Exudates, N/A N/A Subcutaneous Level: Skin/Subcutaneous N/A N/A Tissue Debridement Area (sq 1.8 N/A N/A cm): Instrument: Curette N/A N/A Bleeding: Minimum N/A N/A Hemostasis Achieved: Pressure N/A N/A Procedural Pain: 0 N/A N/A Post Procedural Pain: 0 N/A N/A Debridement Treatment Procedure was tolerated N/A N/A Response: well Post Debridement 1.8x1x0.2 N/A N/A Measurements L x W x D (cm) Post Debridement 0.283 N/A N/A Volume: (cm) Periwound Skin Texture: Edema: No N/A N/A Excoriation: No Induration: No Callus: No Crepitus: No Fluctuance: No Friable: No Rash: No Scarring: No Periwound Skin Maceration: No N/A N/A  Moisture: Moist: No Dry/Scaly: No Periwound Skin Color: Atrophie Blanche: No N/A N/A Cyanosis: No Ecchymosis: No Erythema: No Hemosiderin Staining: No Mottled: No Pallor: No Rubor: No Tenderness on No N/A N/A Palpation: Wound Preparation: Ulcer Cleansing: N/A N/A Rinsed/Irrigated with Saline Topical Anesthetic Applied: Other: lidocaine 4% Procedures Performed: Debridement N/A N/A [name] ([id number]) Treatment Notes Electronic Signature(s) Signed: 03/22/2015 4:22:57 PM By: Elliot Gurney, RN, BSN, Kim RN, BSN Entered By: Elliot Gurney, RN, BSN, Kim on 03/22/2015 15:50:21 [name] ([id number]) -------------------------------------------------------------------------------- Multi-Disciplinary Care Plan Details Patient Name: Cameron GUZMAN,  [name] Date of Service: 03/22/2015 3:15 PM Medical Record Number: [id number] Patient Account Number: [id number] Date of Birth/Sex: 07/05/1956 (59 y.o. Male) Treating RN: Huel Coventry Primary Care Physician: Other Clinician: Referring Physician: Bayard Males Treating Physician/Extender: BURNS III, Regis Bill in Treatment: 0 Active Inactive Orientation to the Wound Care Program Nursing Diagnoses: Knowledge deficit related to the wound healing center program Goals: Patient/caregiver will verbalize understanding of the Wound Healing Center Program Date Initiated: 03/22/2015 Goal Status: Active Interventions: Provide education on orientation to the wound center Notes: Venous Leg Ulcer Nursing Diagnoses: Actual venous Insuffiency (use after diagnosis is confirmed) Goals: Patient will maintain optimal edema control Date Initiated: 03/22/2015 Goal Status: Active Interventions: Assess peripheral edema status every visit. Notes: Wound/Skin Impairment Nursing Diagnoses: Impaired tissue integrity Goals: Patient/caregiver will verbalize understanding of skin care regimen Date Initiated: 03/22/2015 MYLIK PRO, [name] ([id number]) Goal Status: Active Ulcer/skin breakdown will heal within 14 weeks Date Initiated: 03/22/2015 Goal Status: Active Interventions: Assess ulceration(s) every visit Treatment Activities: Topical wound management initiated : 03/22/2015 Notes: Electronic Signature(s) Signed: 03/22/2015 4:22:57 PM By: Elliot Gurney, RN, BSN, Kim RN, BSN Entered By: Elliot Gurney, RN, BSN, Kim on 03/22/2015 15:31:45 [name] ([id number]) -------------------------------------------------------------------------------- Pain Assessment Details Patient Name: [name] Date of Service: 03/22/2015 3:15 PM Medical Record Number: [id number] Patient Account Number: [id number] Date of Birth/Sex: February 11, 1956 (59 y.o. Male) Treating RN: Huel Coventry Primary Care Physician: Other  Clinician: Referring Physician: Bayard Males Treating Physician/Extender: BURNS III, WALTER Weeks in Treatment: 0 Active Problems Location of Pain Severity and Description of Pain Patient Has Paino No Site Locations Duration of the Pain. Constant / Intermittento Constant Rate the pain. Current Pain Level: 7 Character of Pain Describe the Pain: Burning, Tender Pain Management and Medication Current Pain Management: Electronic Signature(s) Signed: 03/22/2015 4:22:57 PM By: Elliot Gurney, RN, BSN, Kim RN, BSN Entered By: Elliot Gurney, RN, BSN, Kim on 03/22/2015 15:15:20 [name] ([id number]) -------------------------------------------------------------------------------- Patient/Caregiver Education Details Patient Name: [name] Date of Service: 03/22/2015 3:15 PM Medical Record Number: [id number] Patient Account Number: [id number] Date of Birth/Gender: 01-May-1956 (59 y.o. Male) Treating RN: Huel Coventry Primary Care Physician: Other Clinician: Referring Physician: Bayard Males Treating Physician/Extender: BURNS III, Regis Bill in Treatment: 0 Education Assessment Education Provided To: Patient Education Topics Provided Wound/Skin Impairment: Handouts: Caring for Your Ulcer, Other: wound care as prescribed Electronic Signature(s) Signed: 03/22/2015 4:22:57 PM By: Elliot Gurney, RN, BSN, Kim RN, BSN Entered By: Elliot Gurney, RN, BSN, Kim on 03/22/2015 16:18:30 [name] ([id number]) -------------------------------------------------------------------------------- Wound Assessment Details Patient Name: [name] Date of Service: 03/22/2015 3:15 PM Medical Record Number: [id number] Patient Account Number: [id number] Date of Birth/Sex: Aug 25, 1955 (59 y.o. Male) Treating RN: Huel Coventry Primary Care Physician: Other Clinician: Referring Physician: Bayard Males Treating Physician/Extender: BURNS III, WALTER Weeks in Treatment: 0 Wound Status Wound Number:  1 Primary Etiology: Venous Leg Ulcer Wound Location: Left Malleolus - Medial Wound Status: Open Wounding Event: Gradually Appeared Date  Acquired: 02/14/2015 Weeks Of Treatment: 0 Clustered Wound: No Photos Wound Measurements Length: (cm) 1.8 Width: (cm) 1 Depth: (cm) 0.1 Area: (cm) 1.414 Volume: (cm) 0.141 % Reduction in Area: 0% % Reduction in Volume: 0% Wound Description Classification: Partial Thickness Wound Margin: Indistinct, nonvisible Exudate Amount: Small Exudate Type: Serous Exudate Color: amber Wound Bed Granulation Amount: None Present (0%) Exposed Structure Necrotic Amount: Large (67-100%) Fascia Exposed: No Necrotic Quality: Adherent Slough Fat Layer Exposed: No Tendon Exposed: No Muscle Exposed: No [name] ([id number]) Joint Exposed: No Bone Exposed: No Limited to Skin Breakdown Periwound Skin Texture Texture Color No Abnormalities Noted: No No Abnormalities Noted: No Callus: No Atrophie Blanche: No Crepitus: No Cyanosis: No Excoriation: No Ecchymosis: No Fluctuance: No Erythema: No Friable: No Hemosiderin Staining: No Induration: No Mottled: No Localized Edema: No Pallor: No Rash: No Rubor: No Scarring: No Moisture No Abnormalities Noted: No Dry / Scaly: No Maceration: No Moist: No Wound Preparation Ulcer Cleansing: Rinsed/Irrigated with Saline Topical Anesthetic Applied: Other: lidocaine 4%, Assessment Notes Patient had painted wound with red fingernail polish. Treatment Notes Wound #1 (Left, Medial Malleolus) 1. Cleansed with: Clean wound with Normal Saline 2. Anesthetic Topical Lidocaine 4% cream to wound bed prior to debridement 4. Dressing Applied: Prisma Ag 5. Secondary Dressing Applied Bordered Foam Dressing 7. Secured with International aid/development worker) Signed: 03/23/2015 5:03:43 PM By: Elliot Gurney, RN, BSN, Kim RN, BSN Previous Signature: 03/22/2015 4:22:57 PM Version By: Elliot Gurney, RN, BSN, Kim RN,  BSN Entered By: Elliot Gurney, RN, BSN, Kim on 03/22/2015 40:98:11 [name] ([id number]) [name], [name] ([id number]) -------------------------------------------------------------------------------- Vitals Details Patient Name: [name] Date of Service: 03/22/2015 3:15 PM Medical Record Number: [id number] Patient Account Number: [id number] Date of Birth/Sex: 05-31-1956 (59 y.o. Male) Treating RN: Huel Coventry Primary Care Physician: Other Clinician: Referring Physician: Bayard Males Treating Physician/Extender: BURNS III, WALTER Weeks in Treatment: 0 Vital Signs Time Taken: 15:15 Temperature (F): 97.8 Height (in): 76 Pulse (bpm): 70 Weight (lbs): 175 Respiratory Rate (breaths/min): 18 Body Mass Index (BMI): 21.3 Blood Pressure (mmHg): 111/65 Reference Range: 80 - 120 mg / dl Electronic Signature(s) Signed: 03/22/2015 4:22:57 PM By: Elliot Gurney, RN, BSN, Kim RN, BSN Entered By: Elliot Gurney, RN, BSN, Kim on 03/22/2015 15:16:14

## 2015-03-23 NOTE — Progress Notes (Signed)
[name], [name] ([id number]) Visit Report for 03/22/2015 Abuse/Suicide Risk Screen Details [name], Date of Service: 03/22/2015 3:15 PM Patient Name: [name] Patient Account Number: [id number] Medical Record Treating RN: Huel Coventry [id number] Number: Other Clinician: Date of Birth/Sex: 1955-10-29 (59 y.o. Male) Treating BURNS III, Primary Care Physician: Physician/Extender: Zollie Beckers Referring Physician: Bayard Males Weeks in Treatment: 0 Abuse/Suicide Risk Screen Items Answer ABUSE/SUICIDE RISK SCREEN: Has anyone close to you tried to hurt or harm you recentlyo No Do you feel uncomfortable with anyone in your familyo No Has anyone forced you do things that you didnot want to doo No Do you have any thoughts of harming yourselfo No Patient displays signs or symptoms of abuse and/or neglect. No Electronic Signature(s) Signed: 03/22/2015 4:22:57 PM By: Elliot Gurney, RN, BSN, Kim RN, BSN Entered By: Elliot Gurney, RN, BSN, Kim on 03/22/2015 15:28:58 [name], [name] ([id number]) -------------------------------------------------------------------------------- Activities of Daily Living Details [name], Date of Service: 03/22/2015 3:15 PM Patient Name: [name] Patient Account Number: [id number] Medical Record Treating RN: Huel Coventry [id number] Number: Other Clinician: Date of Birth/Sex: 1956/04/23 (59 y.o. Male) Treating BURNS III, Primary Care Physician: Physician/Extender: Zollie Beckers Referring Physician: Bayard Males Weeks in Treatment: 0 Activities of Daily Living Items Answer Activities of Daily Living (Please select one for each item) Drive Automobile Completely Able Take Medications Completely Able Use Telephone Completely Able Care for Appearance Completely Able Use Toilet Completely Able Bath / Shower Completely Able Dress Self Completely Able Feed Self Completely Able Walk Completely Able Get In / Out Bed Completely Able Housework Completely Able Prepare Meals Completely  Able Handle Money Completely Able Shop for Self Completely Able Electronic Signature(s) Signed: 03/22/2015 4:22:57 PM By: Elliot Gurney, RN, BSN, Kim RN, BSN Entered By: Elliot Gurney, RN, BSN, Kim on 03/22/2015 15:29:11 [name], [name] ([id number]) -------------------------------------------------------------------------------- Education Assessment Details [name], Date of Service: 03/22/2015 3:15 PM Patient Name: [name] Patient Account Number: [id number] Medical Record Treating RN: Huel Coventry [id number] Number: Other Clinician: Date of Birth/Sex: 1956-01-26 (59 y.o. Male) Treating BURNS III, Primary Care Physician: Physician/Extender: Zollie Beckers Referring Physician: Bayard Males Weeks in Treatment: 0 Learning Preferences/Education Level/Primary Language Preferred Language: Spanish Cognitive Barrier Assessment/Beliefs Language Barrier: Corporate investment banker Needed: Yes Hospital Employed Language Interpreter Memory Deficit: No Emotional Barrier: No Cultural/Religious Beliefs Affecting Medical No Care: Physical Barrier Assessment Impaired Vision: No Impaired Hearing: No Decreased Hand dexterity: No Knowledge/Comprehension Assessment Knowledge Level: High Comprehension Level: High Ability to understand written High instructions: Ability to understand verbal High instructions: Motivation Assessment Anxiety Level: Calm Cooperation: Cooperative Education Importance: Acknowledges Need Interest in Health Problems: Asks Questions Perception: Coherent Willingness to Engage in Self- High Management Activities: Readiness to Engage in Self- High Management Activities: Electronic Signature(s) Signed: 03/22/2015 4:22:57 PM By: Elliot Gurney, RN, BSN, Kim RN, BSN Cameron Garrochales, [name] ([id number]) Entered By: Elliot Gurney, RN, BSN, Kim on 03/22/2015 15:29:49 [name], [name] ([id number]) -------------------------------------------------------------------------------- Fall Risk Assessment Details [name], Date of Service: 03/22/2015 3:15 PM Patient Name: [name] Patient Account Number: [id number] Medical Record Treating RN: Huel Coventry [id number] Number: Other Clinician: Date of Birth/Sex: June 26, 1956 (59 y.o. Male) Treating BURNS III, Primary Care Physician: Physician/Extender: Zollie Beckers Referring Physician: Bayard Males Weeks in Treatment: 0 Fall Risk Assessment Items FALL RISK ASSESSMENT: History of falling - immediate or within 3 months 0 No Secondary diagnosis 0 No Ambulatory aid None/bed rest/wheelchair/nurse 0 Yes Crutches/cane/walker 0 No Furniture 0 No IV Access/Saline Lock 0 No Gait/Training Normal/bed rest/immobile 0 Yes Weak 0 No Impaired 0 No Mental Status Oriented to own ability 0 Yes  Electronic Signature(s) Signed: 03/22/2015 4:22:57 PM By: Elliot Gurney, RN, BSN, Kim RN, BSN Entered By: Elliot Gurney, RN, BSN, Kim on 03/22/2015 15:29:59 [name], [name] ([id number]) -------------------------------------------------------------------------------- Foot Assessment Details [name], Date of Service: 03/22/2015 3:15 PM Patient Name: [name] Patient Account Number: [id number] Medical Record Treating RN: Huel Coventry [id number] Number: Other Clinician: Date of Birth/Sex: Oct 06, 1955 (59 y.o. Male) Treating BURNS III, Primary Care Physician: Physician/Extender: Zollie Beckers Referring Physician: Bayard Males Weeks in Treatment: 0 Foot Assessment Items Site Locations + = Sensation present, - = Sensation absent, C = Callus, U = Ulcer R = Redness, W = Warmth, M = Maceration, PU = Pre-ulcerative lesion F = Fissure, S = Swelling, D = Dryness Assessment Right: Left: Other Deformity: No No Prior Foot Ulcer: No No Prior Amputation: No No Charcot Joint: No No Ambulatory Status: Ambulatory Without Help Gait: Steady Electronic Signature(s) Signed: 03/22/2015 4:22:57 PM By: Elliot Gurney, RN, BSN, Kim RN, BSN Entered By: Elliot Gurney, RN, BSN, Kim on 03/22/2015 15:30:48 [name], [name]  ([id number]) -------------------------------------------------------------------------------- Nutrition Risk Assessment Details [name], Date of Service: 03/22/2015 3:15 PM Patient Name: [name] Patient Account Number: [id number] Medical Record Treating RN: Huel Coventry [id number] Number: Other Clinician: Date of Birth/Sex: 1955-11-01 (59 y.o. Male) Treating BURNS III, Primary Care Physician: Physician/Extender: Zollie Beckers Referring Physician: Bayard Males Weeks in Treatment: 0 Height (in): 76 Weight (lbs): 175 Body Mass Index (BMI): 21.3 Nutrition Risk Assessment Items NUTRITION RISK SCREEN: I have an illness or condition that made me change the kind and/or 0 No amount of food I eat I eat fewer than two meals per day 0 No I eat few fruits and vegetables, or milk products 0 No I have three or more drinks of beer, liquor or wine almost every day 0 No I have tooth or mouth problems that make it hard for me to eat 0 No I don't always have enough money to buy the food I need 0 No I eat alone most of the time 0 No I take three or more different prescribed or over-the-counter drugs a 0 No day Without wanting to, I have lost or gained 10 pounds in the last six 0 No months I am not always physically able to shop, cook and/or feed myself 0 No Nutrition Protocols Good Risk Protocol 0 No interventions needed Moderate Risk Protocol Electronic Signature(s) Signed: 03/22/2015 4:22:57 PM By: Elliot Gurney, RN, BSN, Kim RN, BSN Entered By: Elliot Gurney, RN, BSN, Kim on 03/22/2015 15:30:08

## 2015-03-29 ENCOUNTER — Encounter: Payer: PRIVATE HEALTH INSURANCE | Admitting: Surgery

## 2015-03-29 DIAGNOSIS — L97322 Non-pressure chronic ulcer of left ankle with fat layer exposed: Secondary | ICD-10-CM | POA: Diagnosis not present

## 2015-03-29 NOTE — Progress Notes (Addendum)
[name], [name] ([id number]) Visit Report for 03/29/2015 Arrival Information Details Patient Name: [name], [name] Date of Service: 03/29/2015 3:15 PM Medical Record Number: [id number] Patient Account Number: [id number] Date of Birth/Sex: January 18, 1956 (59 y.o. Male) Treating RN: Clover Mealy, RN, BSN, [place] Sink Primary Care Physician: Other Clinician: Referring Physician: Bayard Males Treating Physician/Extender: BURNS III, Regis Bill in Treatment: 1 Visit Information History Since Last Visit Any new allergies or adverse reactions: No Patient Arrived: Ambulatory Had a fall or experienced change in No Arrival Time: 15:13 activities of daily living that may affect Accompanied By: dtr,interpreter risk of falls: Transfer Assistance: None Signs or symptoms of abuse/neglect since last No Patient Identification Verified: Yes visito Secondary Verification Process Yes Hospitalized since last visit: No Completed: Has Dressing in Place as Prescribed: Yes Patient Requires Transmission-Based No Pain Present Now: Yes Precautions: Patient Has Alerts: No Electronic Signature(s) Signed: 03/29/2015 3:14:14 PM By: Elpidio Eric BSN, RN Entered By: Elpidio Eric on 03/29/2015 15:14:14 [name], [name] ([id number]) -------------------------------------------------------------------------------- Clinic Level of Care Assessment Details Patient Name: [name], [name] Date of Service: 03/29/2015 3:15 PM Medical Record Number: [id number] Patient Account Number: [id number] Date of Birth/Sex: 05/22/1956 (59 y.o. Male) Treating RN: Clover Mealy, RN, BSN, Rita Primary Care Physician: Other Clinician: Referring Physician: Bayard Males Treating Physician/Extender: BURNS III, WALTER Weeks in Treatment: 1 Clinic Level of Care Assessment Items TOOL 4 Quantity Score []  - Use when only an EandM is performed on FOLLOW-UP visit 0 ASSESSMENTS - Nursing Assessment / Reassessment X - Reassessment of  Co-morbidities (includes updates in patient status) 1 10 X - Reassessment of Adherence to Treatment Plan 1 5 ASSESSMENTS - Wound and Skin Assessment / Reassessment X - Simple Wound Assessment / Reassessment - one wound 1 5 []  - Complex Wound Assessment / Reassessment - multiple wounds 0 []  - Dermatologic / Skin Assessment (not related to wound area) 0 ASSESSMENTS - Focused Assessment []  - Circumferential Edema Measurements - multi extremities 0 []  - Nutritional Assessment / Counseling / Intervention 0 X - Lower Extremity Assessment (monofilament, tuning fork, pulses) 1 5 []  - Peripheral Arterial Disease Assessment (using hand held doppler) 0 ASSESSMENTS - Ostomy and/or Continence Assessment and Care []  - Incontinence Assessment and Management 0 []  - Ostomy Care Assessment and Management (repouching, etc.) 0 PROCESS - Coordination of Care X - Simple Patient / Family Education for ongoing care 1 15 []  - Complex (extensive) Patient / Family Education for ongoing care 0 []  - Staff obtains Chiropractor, Records, Test Results / Process Orders 0 []  - Staff telephones HHA, Nursing Homes / Clarify orders / etc 0 []  - Routine Transfer to another Facility (non-emergent condition) 0 [name], [name] ([id number]) []  - Routine Hospital Admission (non-emergent condition) 0 []  - New Admissions / Manufacturing engineer / Ordering NPWT, Apligraf, etc. 0 []  - Emergency Hospital Admission (emergent condition) 0 []  - Simple Discharge Coordination 0 []  - Complex (extensive) Discharge Coordination 0 PROCESS - Special Needs []  - Pediatric / Minor Patient Management 0 []  - Isolation Patient Management 0 []  - Hearing / Language / Visual special needs 0 []  - Assessment of Community assistance (transportation, D/C planning, etc.) 0 []  - Additional assistance / Altered mentation 0 []  - Support Surface(s) Assessment (bed, cushion, seat, etc.) 0 INTERVENTIONS - Wound Cleansing / Measurement X - Simple Wound  Cleansing - one wound 1 5 []  - Complex Wound Cleansing - multiple wounds 0 X - Wound Imaging (photographs - any number of wounds) 1 5 []  - Wound Tracing (instead  of photographs) 0 X - Simple Wound Measurement - one wound 1 5 []  - Complex Wound Measurement - multiple wounds 0 INTERVENTIONS - Wound Dressings X - Small Wound Dressing one or multiple wounds 1 10 []  - Medium Wound Dressing one or multiple wounds 0 []  - Large Wound Dressing one or multiple wounds 0 []  - Application of Medications - topical 0 []  - Application of Medications - injection 0 INTERVENTIONS - Miscellaneous []  - External ear exam 0 [name], [name] ([id number]) []  - Specimen Collection (cultures, biopsies, blood, body fluids, etc.) 0 []  - Specimen(s) / Culture(s) sent or taken to Lab for analysis 0 []  - Patient Transfer (multiple staff / Michiel Sites Lift / Similar devices) 0 []  - Simple Staple / Suture removal (25 or less) 0 []  - Complex Staple / Suture removal (26 or more) 0 []  - Hypo / Hyperglycemic Management (close monitor of Blood Glucose) 0 []  - Ankle / Brachial Index (ABI) - do not check if billed separately 0 X - Vital Signs 1 5 Has the patient been seen at the hospital within the last three years: Yes Total Score: 70 Level Of Care: New/Established - Level 2 Electronic Signature(s) Signed: 03/29/2015 3:39:28 PM By: Elpidio Eric BSN, RN Entered By: Elpidio Eric on 03/29/2015 15:39:27 [name], [name] ([id number]) -------------------------------------------------------------------------------- Encounter Discharge Information Details Patient Name: [name], [name] Date of Service: 03/29/2015 3:15 PM Medical Record Number: [id number] Patient Account Number: [id number] Date of Birth/Sex: [birth year] (59 y.o. Male) Treating RN: Clover Mealy, RN, BSN, Rita Primary Care Physician: Other Clinician: Referring Physician: Bayard Males Treating Physician/Extender: BURNS III, Regis Bill in Treatment: 1 Encounter  Discharge Information Items Discharge Pain Level: 0 Discharge Condition: Stable Ambulatory Status: Ambulatory Discharge Destination: Home Transportation: Private Auto Accompanied By: dtr, interpreter Schedule Follow-up Appointment: No Medication Reconciliation completed and provided to Patient/Care No Ashaun Gaughan: Provided on Clinical Summary of Care: 03/29/2015 Form Type Recipient Paper Patient IGA Electronic Signature(s) Signed: 03/29/2015 3:40:14 PM By: Elpidio Eric BSN, RN Previous Signature: 03/29/2015 3:39:43 PM Version By: Gwenlyn Perking Entered By: Elpidio Eric on 03/29/2015 15:40:14 [name], [name] ([id number]) -------------------------------------------------------------------------------- Lower Extremity Assessment Details Patient Name: [name], [name] Date of Service: 03/29/2015 3:15 PM Medical Record Number: [id number] Patient Account Number: [id number] Date of Birth/Sex: 08-31-1955 (59 y.o. Male) Treating RN: Clover Mealy, RN, BSN, Rita Primary Care Physician: Other Clinician: Referring Physician: Bayard Males Treating Physician/Extender: BURNS III, WALTER Weeks in Treatment: 1 Vascular Assessment Pulses: Posterior Tibial Dorsalis Pedis Palpable: [Left:Yes] Extremity colors, hair growth, and conditions: Extremity Color: [Left:Normal] Hair Growth on Extremity: [Left:Yes] Temperature of Extremity: [Left:Warm] Capillary Refill: [Left:< 3 seconds] Toe Nail Assessment Left: Right: Thick: Yes Discolored: No Deformed: No Improper Length and Hygiene: No Electronic Signature(s) Signed: 03/29/2015 3:20:18 PM By: Elpidio Eric BSN, RN Entered By: Elpidio Eric on 03/29/2015 15:20:18 [name], [name] ([id number]) -------------------------------------------------------------------------------- Multi Wound Chart Details Patient Name: [name], [name] Date of Service: 03/29/2015 3:15 PM Medical Record Number: [id number] Patient Account Number: [id number] Date of  Birth/Sex: December 30, 1955 (59 y.o. Male) Treating RN: Clover Mealy, RN, BSN, Rita Primary Care Physician: Other Clinician: Referring Physician: Bayard Males Treating Physician/Extender: BURNS III, WALTER Weeks in Treatment: 1 Vital Signs Height(in): 76 Pulse(bpm): 67 Weight(lbs): 175 Blood Pressure 103/67 (mmHg): Body Mass Index(BMI): 21 Temperature(F): 98.2 Respiratory Rate 18 (breaths/min): Photos: [1:No Photos] [N/A:N/A] Wound Location: [1:Left Malleolus - Medial] [N/A:N/A] Wounding Event: [1:Gradually Appeared] [N/A:N/A] Primary Etiology: [1:Venous Leg Ulcer] [N/A:N/A] Date Acquired: [1:02/14/2015] [N/A:N/A] Weeks of Treatment: [1:1] [N/A:N/A] Wound Status: [1:Open] [N/A:N/A] Measurements  L x W x D 2x0.4x0.1 [N/A:N/A] (cm) Area (cm) : [1:0.628] [N/A:N/A] Volume (cm) : [1:0.063] [N/A:N/A] % Reduction in Area: [1:55.60%] [N/A:N/A] % Reduction in Volume: 55.30% [N/A:N/A] Classification: [1:Partial Thickness] [N/A:N/A] Exudate Amount: [1:Small] [N/A:N/A] Exudate Type: [1:Serous] [N/A:N/A] Exudate Color: [1:amber] [N/A:N/A] Wound Margin: [1:Indistinct, nonvisible] [N/A:N/A] Granulation Amount: [1:None Present (0%)] [N/A:N/A] Necrotic Amount: [1:Large (67-100%)] [N/A:N/A] Necrotic Tissue: [1:Eschar] [N/A:N/A] Exposed Structures: [1:Fascia: No Fat: No Tendon: No Muscle: No Joint: No Bone: No Limited to Skin Breakdown] [N/A:N/A] Periwound Skin Texture: [N/A:N/A] Edema: No Excoriation: No Induration: No Callus: No Crepitus: No Fluctuance: No Friable: No Rash: No Scarring: No Periwound Skin Dry/Scaly: Yes N/A N/A Moisture: Maceration: No Moist: No Periwound Skin Color: Atrophie Blanche: No N/A N/A Cyanosis: No Ecchymosis: No Erythema: No Hemosiderin Staining: No Mottled: No Pallor: No Rubor: No Temperature: No Abnormality N/A N/A Tenderness on No N/A N/A Palpation: Wound Preparation: Ulcer Cleansing: N/A N/A Rinsed/Irrigated with Saline Topical  Anesthetic Applied: Other: lidocaine 4% Treatment Notes Electronic Signature(s) Signed: 03/29/2015 3:28:08 PM By: Elpidio Eric BSN, RN Entered By: Elpidio Eric on 03/29/2015 15:28:07 [name], [name] ([id number]) -------------------------------------------------------------------------------- Multi-Disciplinary Care Plan Details Patient Name: [name], [name] Date of Service: 03/29/2015 3:15 PM Medical Record Number: [id number] Patient Account Number: [id number] Date of Birth/Sex: 1955-08-08 (59 y.o. Male) Treating RN: Clover Mealy, RN, BSN, [place] Sink Primary Care Physician: Other Clinician: Referring Physician: Bayard Males Treating Physician/Extender: BURNS III, Regis Bill in Treatment: 1 Active Inactive Orientation to the Wound Care Program Nursing Diagnoses: Knowledge deficit related to the wound healing center program Goals: Patient/caregiver will verbalize understanding of the Wound Healing Center Program Date Initiated: 03/22/2015 Goal Status: Active Interventions: Provide education on orientation to the wound center Notes: Venous Leg Ulcer Nursing Diagnoses: Actual venous Insuffiency (use after diagnosis is confirmed) Goals: Patient will maintain optimal edema control Date Initiated: 03/22/2015 Goal Status: Active Interventions: Assess peripheral edema status every visit. Notes: Wound/Skin Impairment Nursing Diagnoses: Impaired tissue integrity Goals: Patient/caregiver will verbalize understanding of skin care regimen Date Initiated: 03/22/2015 [name], [name] ([id number]) Goal Status: Active Ulcer/skin breakdown will heal within 14 weeks Date Initiated: 03/22/2015 Goal Status: Active Interventions: Assess ulceration(s) every visit Treatment Activities: Topical wound management initiated : 03/29/2015 Notes: Electronic Signature(s) Signed: 03/29/2015 3:27:56 PM By: Elpidio Eric BSN, RN Entered By: Elpidio Eric on 03/29/2015 15:27:56 [name], [name]  ([id number]) -------------------------------------------------------------------------------- Pain Assessment Details Patient Name: [name], [name] Date of Service: 03/29/2015 3:15 PM Medical Record Number: [id number] Patient Account Number: [id number] Date of Birth/Sex: [birth year] (59 y.o. Male) Treating RN: Clover Mealy, RN, BSN, Rita Primary Care Physician: Other Clinician: Referring Physician: Bayard Males Treating Physician/Extender: BURNS III, WALTER Weeks in Treatment: 1 Active Problems Location of Pain Severity and Description of Pain Patient Has Paino Yes Site Locations Rate the pain. Current Pain Level: 8 Pain Management and Medication Current Pain Management: Electronic Signature(s) Signed: 03/29/2015 3:14:29 PM By: Elpidio Eric BSN, RN Entered By: Elpidio Eric on 03/29/2015 15:14:29 [name], [name] ([id number]) -------------------------------------------------------------------------------- Patient/Caregiver Education Details Patient Name: [name], [name] Date of Service: 03/29/2015 3:15 PM Medical Record Number: [id number] Patient Account Number: [id number] Date of Birth/Gender: 27-Apr-1956 (59 y.o. Male) Treating RN: Clover Mealy, RN, BSN, [place] Sink Primary Care Physician: Other Clinician: Referring Physician: Bayard Males Treating Physician/Extender: BURNS III, Regis Bill in Treatment: 1 Education Assessment Education Provided To: Patient Education Topics Provided Basic Hygiene: Methods: Explain/Verbal Responses: State content correctly Welcome To The [facility]: Methods: Explain/Verbal Responses: State content correctly Electronic Signature(s) Signed: 03/29/2015 3:40:32 PM By: Clover Mealy,  [place] Sink BSN, RN Entered By: Elpidio Eric on 03/29/2015 15:40:32 [name], [name] ([id number]) -------------------------------------------------------------------------------- Wound Assessment Details Patient Name: [name], [name] Date of Service: 03/29/2015 3:15  PM Medical Record Number: [id number] Patient Account Number: [id number] Date of Birth/Sex: [birth year] (59 y.o. Male) Treating RN: Clover Mealy, RN, BSN, Rita Primary Care Physician: Other Clinician: Referring Physician: Bayard Males Treating Physician/Extender: BURNS III, WALTER Weeks in Treatment: 1 Wound Status Wound Number: 1 Primary Etiology: Venous Leg Ulcer Wound Location: Left Malleolus - Medial Wound Status: Open Wounding Event: Gradually Appeared Date Acquired: 02/14/2015 Weeks Of Treatment: 1 Clustered Wound: No Photos Photo Uploaded By: Elpidio Eric on 03/29/2015 16:43:37 Wound Measurements Length: (cm) 2 Width: (cm) 0.4 Depth: (cm) 0.1 Area: (cm) 0.628 Volume: (cm) 0.063 % Reduction in Area: 55.6% % Reduction in Volume: 55.3% Tunneling: No Undermining: No Wound Description Classification: Partial Thickness Wound Margin: Indistinct, nonvisible Exudate Amount: Small Exudate Type: Serous Exudate Color: amber Wound Bed Granulation Amount: None Present (0%) Exposed Structure Necrotic Amount: Large (67-100%) Fascia Exposed: No Necrotic Quality: Eschar Fat Layer Exposed: No Tendon Exposed: No [name], [name] ([id number]) Muscle Exposed: No Joint Exposed: No Bone Exposed: No Limited to Skin Breakdown Periwound Skin Texture Texture Color No Abnormalities Noted: No No Abnormalities Noted: No Callus: No Atrophie Blanche: No Crepitus: No Cyanosis: No Excoriation: No Ecchymosis: No Fluctuance: No Erythema: No Friable: No Hemosiderin Staining: No Induration: No Mottled: No Localized Edema: No Pallor: No Rash: No Rubor: No Scarring: No Temperature / Pain Moisture Temperature: No Abnormality No Abnormalities Noted: No Dry / Scaly: Yes Maceration: No Moist: No Wound Preparation Ulcer Cleansing: Rinsed/Irrigated with Saline Topical Anesthetic Applied: Other: lidocaine 4%, Treatment Notes Wound #1 (Left, Medial Malleolus) 1. Cleansed  with: Clean wound with Normal Saline 4. Dressing Applied: Prisma Ag 5. Secondary Dressing Applied Bordered Foam Dressing 7. Secured with International aid/development worker) Signed: 03/29/2015 3:18:37 PM By: Elpidio Eric BSN, RN Entered By: Elpidio Eric on 03/29/2015 15:18:37 [name], [name] ([id number]) -------------------------------------------------------------------------------- Vitals Details Patient Name: [name], [name] Date of Service: 03/29/2015 3:15 PM Medical Record Number: [id number] Patient Account Number: [id number] Date of Birth/Sex: 1955-10-11 (59 y.o. Male) Treating RN: Clover Mealy, RN, BSN, Rita Primary Care Physician: Other Clinician: Referring Physician: Bayard Males Treating Physician/Extender: BURNS III, WALTER Weeks in Treatment: 1 Vital Signs Time Taken: 15:19 Temperature (F): 98.2 Height (in): 76 Pulse (bpm): 67 Weight (lbs): 175 Respiratory Rate (breaths/min): 18 Body Mass Index (BMI): 21.3 Blood Pressure (mmHg): 103/67 Reference Range: 80 - 120 mg / dl Electronic Signature(s) Signed: 03/29/2015 3:19:52 PM By: Elpidio Eric BSN, RN Entered By: Elpidio Eric on 03/29/2015 15:19:51

## 2015-03-30 NOTE — Progress Notes (Signed)
[name], [name] ([id number]) Visit Report for 03/29/2015 Chief Complaint Document Details [name], Date of Service: 03/29/2015 3:15 PM Patient Name: [name] Patient Account Number: [id number] Medical Record Treating RN: Clover Mealy RN, BSN, [place] [id number] Number: Other Clinician: Date of Birth/Sex: 1955-11-01 (59 y.o. Male) Treating BURNS III, Primary Care Physician: Physician/Extender: Zollie Beckers Referring Physician: Bayard Males Weeks in Treatment: 1 Information Obtained from: Patient Chief Complaint Chronic left calf ulcer. Electronic Signature(s) Signed: 03/29/2015 4:56:52 PM By: Madelaine Bhat MD Entered By: Madelaine Bhat on 03/29/2015 16:45:18 [name], [name] ([id number]) -------------------------------------------------------------------------------- HPI Details [name], Date of Service: 03/29/2015 3:15 PM Patient Name: [name] Patient Account Number: [id number] Medical Record Treating RN: Clover Mealy RN, BSN, [place] Sink [id number] Number: Other Clinician: Date of Birth/Sex: 1955-10-29 (58 y.o. Male) Treating BURNS III, Primary Care Physician: Physician/Extender: Zollie Beckers Referring Physician: Bayard Males Weeks in Treatment: 1 History of Present Illness HPI Description: Pleasant 59 year old with no significant past medical history. No history of diabetes or peripheral arterial disease. History obtained from interpreter. He says that he developed a spontaneous ulceration on his left medial ankle in July 2016. He thinks that it was secondary to a "ruptured vein". He denies any history of venous stasis ulcerations or of DVT. No ultrasound evaluation. Does not normally wear compression. Ambulating per his baseline. 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 No claudication or rest pain. Performing dressing changes with Prisma and using Tubigrip for edema control. Awaiting juxtalite. Did not think compression bandages were good option for him given his work  requirements. He returns to clinic for follow-up and is without new complaints. No significant pain. No fever or chills. Minimal to no drainage. Electronic Signature(s) Signed: 03/29/2015 4:56:52 PM By: Madelaine Bhat MD Entered By: Madelaine Bhat on 03/29/2015 16:47:25 [name], [name] ([id number]) -------------------------------------------------------------------------------- Physical Exam Details [name], Date of Service: 03/29/2015 3:15 PM Patient Name: [name] Patient Account Number: [id number] Medical Record Treating RN: Clover Mealy RN, BSN, [place] Sink [id number] Number: Other Clinician: Date of Birth/Sex: 1956-05-11 (58 y.o. Male) Treating BURNS III, Primary Care Physician: Physician/Extender: Zollie Beckers Referring Physician: Bayard Males Weeks in Treatment: 1 Constitutional . Pulse regular. Respirations normal and unlabored. Afebrile. Marland Kitchen Respiratory WNL. No retractions.. Cardiovascular Pedal Pulses WNL. Integumentary (Hair, Skin) .Marland Kitchen Neurological Sensation normal to touch, pin,and vibration. Psychiatric Judgement and insight Intact.. Oriented times 3.. No evidence of depression, anxiety, or agitation.. Notes Left medial ankle ulceration improved. Full-thickness. No exposed deep structures. No cellulitis. Surrounding hyperpigmentation. Multiple prominent varicosities in the distal calf and ankle. 1+ pitting edema. Palpable DP. ABI falsely elevated at 1.6. Electronic Signature(s) Signed: 03/29/2015 4:56:52 PM By: Madelaine Bhat MD Entered By: Madelaine Bhat on 03/29/2015 16:48:04 [name], [name] ([id number]) -------------------------------------------------------------------------------- Physician Orders Details [name], Date of Service: 03/29/2015 3:15 PM Patient Name: [name] Patient Account Number: [id number] Medical Record Treating RN: Clover Mealy RN, BSN, [place] [id number] Number: Other Clinician: Date of Birth/Sex: [birth year] (58 y.o. Male) Treating  BURNS III, Primary Care Physician: Physician/Extender: Zollie Beckers Referring Physician: Manuela Schwartz in Treatment: 1 Verbal / Phone Orders: Yes Clinician: Afful, RN, BSN, Rita Read Back and Verified: Yes Diagnosis Coding Wound Cleansing Wound #1 Left,Medial Malleolus o Clean wound with Normal Saline. Anesthetic Wound #1 Left,Medial Malleolus o Topical Lidocaine 4% cream applied to wound bed prior to debridement Primary Wound Dressing Wound #1 Left,Medial Malleolus o Prisma Ag Secondary Dressing Wound #1 Left,Medial Malleolus o Boardered Foam Dressing Dressing Change Frequency Wound #1 Left,Medial Malleolus o Change dressing every other day. Follow-up Appointments  Wound #1 Left,Medial Malleolus o Return Appointment in 2 weeks. Edema Control Wound #1 Left,Medial Malleolus o Tubigrip Electronic Signature(s) Signed: 03/29/2015 3:36:53 PM By: Elpidio Eric BSN, RN Signed: 03/29/2015 4:56:52 PM By: Madelaine Bhat MD [name], [name] ([id number]) Entered By: Elpidio Eric on 03/29/2015 15:36:53 [name], [name] ([id number]) -------------------------------------------------------------------------------- Problem List Details [name], Date of Service: 03/29/2015 3:15 PM Patient Name: [name] Patient Account Number: [id number] Medical Record Treating RN: Clover Mealy RN, BSN, [place] Sink [id number] Number: Other Clinician: Date of Birth/Sex: 16-Oct-1955 (58 y.o. 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 Male) Treating BURNS III, Primary Care Physician: Physician/Extender: Zollie Beckers Referring Physician: Bayard Males Weeks in Treatment: 1 Subjective Chief Complaint Information obtained from Patient Chronic left calf ulcer. History of Present Illness (HPI) Pleasant 59 year old with no significant past medical history. No history of diabetes or peripheral arterial disease. History obtained from interpreter. He says that he developed a spontaneous ulceration on his left medial ankle in July 2016. He thinks that it was secondary to a "ruptured vein". He denies any history of venous stasis ulcerations or of DVT. No ultrasound evaluation. Does not normally wear compression. Ambulating per his baseline. 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 No claudication or rest pain. Performing dressing changes with Prisma and using Tubigrip for edema control. Awaiting juxtalite. Did not think compression bandages were good option for him given his work requirements. He returns to clinic for follow-up and is without new complaints. No significant pain. No fever or chills. Minimal to no drainage. Objective Constitutional Pulse regular. Respirations normal and unlabored. Afebrile. Vitals Time Taken: 3:19 PM, Height: 76 in, Weight: 175 lbs, BMI: 21.3, Temperature: 98.2 F, Pulse: 67 bpm, Respiratory Rate: 18 breaths/min, Blood Pressure: 103/67 mmHg. Respiratory WNL. No retractions.. Cardiovascular [name], [name] ([id number]) Pedal Pulses WNL. Neurological Sensation normal  to touch, pin,and vibration. Psychiatric Judgement and insight Intact.. Oriented times 3.. No evidence of depression, anxiety, or agitation.. General Notes: Left medial ankle ulceration improved. Full-thickness. No exposed deep structures. No cellulitis. Surrounding hyperpigmentation. Multiple prominent varicosities in the distal calf and ankle. 1+ pitting edema. Palpable DP. ABI falsely elevated at 1.6. Integumentary (Hair, Skin) Wound #1 status is Open. Original cause of wound was Gradually Appeared. The wound is located on the Left,Medial Malleolus. The wound measures 2cm length x 0.4cm width x 0.1cm depth; 0.628cm^2 area and 0.063cm^3 volume. The wound is limited to skin breakdown. There is no tunneling or undermining noted. There is a small amount of serous drainage noted. The wound margin is indistinct and nonvisible. There is no granulation within the wound bed. There is a large (67-100%) amount of necrotic tissue within the wound bed including Eschar. The periwound skin appearance exhibited: Dry/Scaly. The periwound skin appearance did not exhibit: Callus, Crepitus, Excoriation, Fluctuance, Friable, Induration, Localized Edema, Rash, Scarring, Maceration, Moist, Atrophie Blanche, Cyanosis,  Ecchymosis, Hemosiderin Staining, Mottled, Pallor, Rubor, Erythema. Periwound temperature was noted as No Abnormality. 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 Plan Wound Cleansing: Wound #1 Left,Medial Malleolus: Clean wound with Normal Saline. [name], [name] ([id number]) Anesthetic: Wound #1 Left,Medial Malleolus: Topical Lidocaine 4% cream applied to wound bed prior to debridement Primary Wound Dressing: Wound #1 Left,Medial Malleolus: Prisma Ag Secondary Dressing: Wound #1 Left,Medial  Malleolus: Boardered Foam Dressing Dressing Change Frequency: Wound #1 Left,Medial Malleolus: Change dressing every other day. Follow-up Appointments: Wound #1 Left,Medial Malleolus: Return Appointment in 2 weeks. Edema Control: Wound #1 Left,Medial Malleolus: Tubigrip Continue with Prisma dressing changes. Awaiting juxtalite compression garment. Tubigrip for now. Electronic Signature(s) Signed: 03/29/2015 4:56:52 PM By: Madelaine Bhat MD Entered By: Madelaine Bhat on 03/29/2015 16:48:35 [name], [name] ([id number]) -------------------------------------------------------------------------------- SuperBill Details [name], Date of Service: 03/29/2015 Patient Name: [name] Patient Account Number: [id number] Medical Record Treating RN: Clover Mealy RN, BSN, [place] Sink [id number] Number: Other Clinician: Date of Birth/Sex: 1956/06/28 (58 y.o. 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

## 2015-04-05 ENCOUNTER — Encounter: Payer: PRIVATE HEALTH INSURANCE | Admitting: Surgery

## 2015-04-05 DIAGNOSIS — L97322 Non-pressure chronic ulcer of left ankle with fat layer exposed: Secondary | ICD-10-CM | POA: Diagnosis not present

## 2015-04-06 NOTE — Progress Notes (Signed)
[name], [name] ([id number]) Visit Report for 04/05/2015 Arrival Information Details Patient Name: [name], [name] Date of Service: 04/05/2015 3:15 PM Medical Record Number: [id number] Patient Account Number: [id number] Date of Birth/Sex: 1956-04-28 (59 y.o. Male) Treating RN: Curtis Sites Primary Care Physician: Other Clinician: Referring Physician: Bayard Males Treating Physician/Extender: BURNS III, Regis Bill in Treatment: 2 Visit Information History Since Last Visit Added or deleted any medications: No Patient Arrived: Ambulatory Any new allergies or adverse reactions: No Arrival Time: 15:22 Had a fall or experienced change in No Accompanied By: self activities of daily living that may affect Transfer Assistance: None risk of falls: Patient Identification Verified: Yes Signs or symptoms of abuse/neglect since last No Secondary Verification Process Yes visito Completed: Hospitalized since last visit: No Patient Requires Transmission-Based No Pain Present Now: No Precautions: Patient Has Alerts: No Electronic Signature(s) Signed: 04/05/2015 4:45:50 PM By: Curtis Sites Entered By: Curtis Sites on 04/05/2015 15:22:37 [name], [name] ([id number]) -------------------------------------------------------------------------------- Encounter Discharge Information Details Patient Name: [name], [name] Date of Service: 04/05/2015 3:15 PM Medical Record Number: [id number] Patient Account Number: [id number] Date of Birth/Sex: Aug 27, 1955 (59 y.o. Male) Treating RN: Curtis Sites Primary Care Physician: Other Clinician: Referring Physician: Bayard Males Treating Physician/Extender: BURNS III, Regis Bill in Treatment: 2 Encounter Discharge Information Items Discharge Pain Level: 0 Discharge Condition: Stable Ambulatory Status: Ambulatory Discharge Destination: Home Transportation: Private Auto Accompanied By: self Schedule Follow-up Appointment:  Yes Medication Reconciliation completed and provided to Patient/Care No Provider: Provided on Clinical Summary of Care: 04/05/2015 Form Type Recipient Paper Patient IAG Electronic Signature(s) Signed: 04/05/2015 3:46:30 PM By: Gwenlyn Perking Entered By: Gwenlyn Perking on 04/05/2015 15:46:30 [name], [name] ([id number]) -------------------------------------------------------------------------------- Lower Extremity Assessment Details Patient Name: [name], [name] Date of Service: 04/05/2015 3:15 PM Medical Record Number: [id number] Patient Account Number: [id number] Date of Birth/Sex: 1955/07/21 (59 y.o. Male) Treating RN: Curtis Sites Primary Care Physician: Other Clinician: Referring Physician: Bayard Males Treating Physician/Extender: BURNS III, WALTER Weeks in Treatment: 2 Edema Assessment Assessed: [Left: No] [Right: No] Edema: [Left: Ye] [Right: s] Vascular Assessment Pulses: Posterior Tibial Dorsalis Pedis Palpable: [Left:Yes] Extremity colors, hair growth, and conditions: Extremity Color: [Left:Hyperpigmented] Hair Growth on Extremity: [Left:Yes] Temperature of Extremity: [Left:Warm] Capillary Refill: [Left:< 3 seconds] Toe Nail Assessment Left: Right: Thick: Yes Discolored: Yes Deformed: Yes Improper Length and Hygiene: No Electronic Signature(s) Signed: 04/05/2015 4:45:50 PM By: Curtis Sites Entered By: Curtis Sites on 04/05/2015 15:26:22 [name], [name] ([id number]) -------------------------------------------------------------------------------- Multi Wound Chart Details Patient Name: [name], [name] Date of Service: 04/05/2015 3:15 PM Medical Record Number: [id number] Patient Account Number: [id number] Date of Birth/Sex: Nov 29, 1955 (59 y.o. Male) Treating RN: Clover Mealy, RN, BSN, Rita Primary Care Physician: Other Clinician: Referring Physician: Bayard Males Treating Physician/Extender: BURNS III, WALTER Weeks in Treatment:  2 Vital Signs Height(in): 76 Pulse(bpm): 70 Weight(lbs): 175 Blood Pressure 111/69 (mmHg): Body Mass Index(BMI): 21 Temperature(F): 98.4 Respiratory Rate 18 (breaths/min): Photos: [1:No Photos] [N/A:N/A] Wound Location: [1:Left Malleolus - Medial] [N/A:N/A] Wounding Event: [1:Gradually Appeared] [N/A:N/A] Primary Etiology: [1:Venous Leg Ulcer] [N/A:N/A] Date Acquired: [1:02/14/2015] [N/A:N/A] Weeks of Treatment: [1:2] [N/A:N/A] Wound Status: [1:Open] [N/A:N/A] Measurements L x W x D 1.7x0.4x0.1 [N/A:N/A] (cm) Area (cm) : [1:0.534] [N/A:N/A] Volume (cm) : [1:0.053] [N/A:N/A] % Reduction in Area: [1:62.20%] [N/A:N/A] % Reduction in Volume: 62.40% [N/A:N/A] Classification: [1:Partial Thickness] [N/A:N/A] Exudate Amount: [1:Small] [N/A:N/A] Exudate Type: [1:Serous] [N/A:N/A] Exudate Color: [1:amber] [N/A:N/A] Wound Margin: [1:Indistinct, nonvisible] [N/A:N/A] Granulation Amount: [1:None Present (0%)] [N/A:N/A] Necrotic Amount: [1:Large (67-100%)] [N/A:N/A] Necrotic Tissue: [1:Eschar] [N/A:N/A]  Exposed Structures: [1:Fascia: No Fat: No Tendon: No Muscle: No Joint: No Bone: No Limited to Skin Breakdown] [N/A:N/A] Epithelialization: [1:None] [N/A:N/A] Debridement: Debridement (16109- N/A N/A 11047) Time-Out Taken: Yes N/A N/A Pain Control: Lidocaine 4% Topical N/A N/A Solution Tissue Debrided: Fibrin/Slough, Exudates, N/A N/A Subcutaneous Level: Skin/Subcutaneous N/A N/A Tissue Debridement Area (sq 0.68 N/A N/A cm): Instrument: Curette N/A N/A Bleeding: Minimum N/A N/A Hemostasis Achieved: Pressure N/A N/A Procedural Pain: 0 N/A N/A Post Procedural Pain: 0 N/A N/A Debridement Treatment Procedure was tolerated N/A N/A Response: well Post Debridement 1.7x0.4x1 N/A N/A Measurements L x W x D (cm) Post Debridement 0.534 N/A N/A Volume: (cm) Periwound Skin Texture: Edema: No N/A N/A Excoriation: No Induration: No Callus: No Crepitus: No Fluctuance:  No Friable: No Rash: No Scarring: No Periwound Skin Dry/Scaly: Yes N/A N/A Moisture: Maceration: No Moist: No Periwound Skin Color: Atrophie Blanche: No N/A N/A Cyanosis: No Ecchymosis: No Erythema: No Hemosiderin Staining: No Mottled: No Pallor: No Rubor: No Temperature: No Abnormality N/A N/A Tenderness on No N/A N/A Palpation: Wound Preparation: Ulcer Cleansing: N/A N/A Rinsed/Irrigated with Saline [name], [name] ([id number]) Topical Anesthetic Applied: Other: lidocaine 4% Procedures Performed: Debridement N/A N/A Treatment Notes Electronic Signature(s) Signed: 04/05/2015 4:45:20 PM By: Elpidio Eric BSN, RN Entered By: Elpidio Eric on 04/05/2015 15:41:47 [name], [name] ([id number]) -------------------------------------------------------------------------------- Multi-Disciplinary Care Plan Details Patient Name: [name], [name] Date of Service: 04/05/2015 3:15 PM Medical Record Number: [id number] Patient Account Number: [id number] Date of Birth/Sex: 08-04-1955 (59 y.o. Male) Treating RN: Clover Mealy, RN, BSN, [place] Sink Primary Care Physician: Other Clinician: Referring Physician: Bayard Males Treating Physician/Extender: BURNS III, Regis Bill in Treatment: 2 Active Inactive Orientation to the Wound Care Program Nursing Diagnoses: Knowledge deficit related to the wound healing center program Goals: Patient/caregiver will verbalize understanding of the Wound Healing Center Program Date Initiated: 03/22/2015 Goal Status: Active Interventions: Provide education on orientation to the wound center Notes: Venous Leg Ulcer Nursing Diagnoses: Actual venous Insuffiency (use after diagnosis is confirmed) Goals: Patient will maintain optimal edema control Date Initiated: 03/22/2015 Goal Status: Active Interventions: Assess peripheral edema status every visit. Notes: Wound/Skin Impairment Nursing Diagnoses: Impaired tissue integrity Goals: Patient/caregiver  will verbalize understanding of skin care regimen Date Initiated: 03/22/2015 [name], [name] ([id number]) Goal Status: Active Ulcer/skin breakdown will heal within 14 weeks Date Initiated: 03/22/2015 Goal Status: Active Interventions: Assess ulceration(s) every visit Treatment Activities: Topical wound management initiated : 04/05/2015 Notes: Electronic Signature(s) Signed: 04/05/2015 4:45:20 PM By: Elpidio Eric BSN, RN Entered By: Elpidio Eric on 04/05/2015 15:41:35 [name], [name] ([id number]) -------------------------------------------------------------------------------- Patient/Caregiver Education Details Patient Name: [name], [name] Date of Service: 04/05/2015 3:15 PM Medical Record Number: [id number] Patient Account Number: [id number] Date of Birth/Gender: 11/10/1955 (59 y.o. Male) Treating RN: Curtis Sites Primary Care Physician: Other Clinician: Referring Physician: Bayard Males Treating Physician/Extender: BURNS III, Regis Bill in Treatment: 2 Education Assessment Education Provided To: Patient Education Topics Provided Wound/Skin Impairment: Handouts: Other: wound care as ordered Methods: Demonstration, Explain/Verbal Responses: State content correctly Electronic Signature(s) Signed: 04/05/2015 4:45:50 PM By: Curtis Sites Entered By: Curtis Sites on 04/05/2015 15:46:18 [name], [name] ([id number]) -------------------------------------------------------------------------------- Wound Assessment Details Patient Name: [name], [name] Date of Service: 04/05/2015 3:15 PM Medical Record Number: [id number] Patient Account Number: [id number] Date of Birth/Sex: 1956/04/02 (59 y.o. Male) Treating RN: Curtis Sites Primary Care Physician: Other Clinician: Referring Physician: Bayard Males Treating Physician/Extender: BURNS III, WALTER Weeks in Treatment: 2 Wound Status Wound Number: 1 Primary Etiology: Venous Leg Ulcer Wound Location:  Left Malleolus - Medial Wound Status: Open Wounding Event: Gradually Appeared Date Acquired: 02/14/2015 Weeks Of Treatment: 2 Clustered Wound: No Photos Photo Uploaded By: Curtis Sites on 04/05/2015 16:44:04 Wound Measurements Length: (cm) 1.7 Width: (cm) 0.4 Depth: (cm) 0.1 Area: (cm) 0.534 Volume: (cm) 0.053 % Reduction in Area: 62.2% % Reduction in Volume: 62.4% Epithelialization: None Tunneling: No Undermining: No Wound Description Classification: Partial Thickness Wound Margin: Indistinct, nonvisible Exudate Amount: Small Exudate Type: Serous Exudate Color: amber Wound Bed Granulation Amount: None Present (0%) Exposed Structure Necrotic Amount: Large (67-100%) Fascia Exposed: No Necrotic Quality: Eschar Fat Layer Exposed: No Tendon Exposed: No [name], [name] ([id number]) Muscle Exposed: No Joint Exposed: No Bone Exposed: No Limited to Skin Breakdown Periwound Skin Texture Texture Color No Abnormalities Noted: No No Abnormalities Noted: No Callus: No Atrophie Blanche: No Crepitus: No Cyanosis: No Excoriation: No Ecchymosis: No Fluctuance: No Erythema: No Friable: No Hemosiderin Staining: No Induration: No Mottled: No Localized Edema: No Pallor: No Rash: No Rubor: No Scarring: No Temperature / Pain Moisture Temperature: No Abnormality No Abnormalities Noted: No Dry / Scaly: Yes Maceration: No Moist: No Wound Preparation Ulcer Cleansing: Rinsed/Irrigated with Saline Topical Anesthetic Applied: Other: lidocaine 4%, Treatment Notes Wound #1 (Left, Medial Malleolus) 1. Cleansed with: Clean wound with Normal Saline 2. Anesthetic Topical Lidocaine 4% cream to wound bed prior to debridement 4. Dressing Applied: Promogran 5. Secondary Dressing Applied Bordered Foam Dressing Electronic Signature(s) Signed: 04/05/2015 4:45:50 PM By: Curtis Sites Entered By: Curtis Sites on 04/05/2015 15:31:07 [name], [name]  ([id number]) -------------------------------------------------------------------------------- Vitals Details Patient Name: [name], [name] Date of Service: 04/05/2015 3:15 PM Medical Record Number: [id number] Patient Account Number: [id number] Date of Birth/Sex: 1955/09/25 (59 y.o. Male) Treating RN: Curtis Sites Primary Care Physician: Other Clinician: Referring Physician: Bayard Males Treating Physician/Extender: BURNS III, WALTER Weeks in Treatment: 2 Vital Signs Time Taken: 15:24 Temperature (F): 98.4 Height (in): 76 Pulse (bpm): 70 Weight (lbs): 175 Respiratory Rate (breaths/min): 18 Body Mass Index (BMI): 21.3 Blood Pressure (mmHg): 111/69 Reference Range: 80 - 120 mg / dl Electronic Signature(s) Signed: 04/05/2015 4:45:50 PM By: Curtis Sites Entered By: Curtis Sites on 04/05/2015 15:25:19

## 2015-04-06 NOTE — Progress Notes (Signed)
[name], [name] ([id number]) Visit Report for 04/05/2015 Chief Complaint Document Details [name], Date of Service: 04/05/2015 3:15 PM Patient Name: [name] Patient Account Number: [id number] Medical Record Treating RN: Curtis Sites [id number] Number: Other Clinician: Date of Birth/Sex: 03/19/1956 (59 y.o. Male) Treating BURNS III, Primary Care Physician: Physician/Extender: Zollie Beckers Referring Physician: Bayard Males Weeks in Treatment: 2 Information Obtained from: Patient Chief Complaint Chronic left calf ulcer. Electronic Signature(s) Signed: 04/05/2015 4:26:09 PM By: Madelaine Bhat MD Entered By: Madelaine Bhat on 04/05/2015 15:49:10 [name], [name] ([id number]) -------------------------------------------------------------------------------- Debridement Details [name], Date of Service: 04/05/2015 3:15 PM Patient Name: [name] Patient Account Number: [id number] Medical Record Treating RN: Curtis Sites [id number] Number: Other Clinician: Date of Birth/Sex: 12-Oct-1955 (59 y.o. Male) Treating BURNS III, Primary Care Physician: Physician/Extender: Zollie Beckers Referring Physician: Bayard Males Weeks in Treatment: 2 Debridement Performed for Wound #1 Left,Medial Malleolus Assessment: Performed By: Physician BURNS III, Melanie Crazier., MD Debridement: Debridement Pre-procedure Yes Verification/Time Out Taken: Start Time: 15:39 Pain Control: Lidocaine 4% Topical Solution Level: Skin/Subcutaneous Tissue Total Area Debrided (L x 1.7 (cm) x 0.4 (cm) = 0.68 (cm) W): Tissue and other Non-Viable, Exudate, Fibrin/Slough, Subcutaneous material debrided: Instrument: Curette Bleeding: Minimum Hemostasis Achieved: Pressure End Time: 15:40 Procedural Pain: 0 Post Procedural Pain: 0 Response to Treatment: Procedure was tolerated well Post Debridement Measurements of Total Wound Length: (cm) 1.7 Width: (cm) 0.4 Depth: (cm) 0.2 Volume: (cm) 0.107 Post  Procedure Diagnosis Same as Pre-procedure Electronic Signature(s) Signed: 04/05/2015 4:26:09 PM By: Madelaine Bhat MD Signed: 04/05/2015 4:45:50 PM By: Curtis Sites Entered By: Madelaine Bhat on 04/05/2015 15:48:56 [name], [name] ([id number]) -------------------------------------------------------------------------------- HPI Details [name], Date of Service: 04/05/2015 3:15 PM Patient Name: [name] Patient Account Number: [id number] Medical Record Treating RN: Curtis Sites [id number] Number: Other Clinician: Date of Birth/Sex: 1956/04/27 (59 y.o. Male) Treating BURNS III, Primary Care Physician: Physician/Extender: Zollie Beckers Referring Physician: Bayard Males Weeks in Treatment: 2 History of Present Illness HPI Description: Pleasant 59 year old with no significant past medical history. No history of diabetes or peripheral arterial disease. History obtained from interpreter. He says that he developed a spontaneous ulceration on his left medial ankle in July 2016. He thinks that it was secondary to a "ruptured vein". He denies any history of venous stasis ulcerations or of DVT. No ultrasound evaluation. Does not normally wear compression. Ambulating per his baseline. 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 No claudication or rest pain. Performing dressing changes with Prisma and using Tubigrip for edema control. Awaiting juxtalite. Did not think compression bandages were good option for him given his work requirements. He returns to clinic for follow-up and is without new complaints. No significant pain. No fever or chills. Minimal to no drainage. Electronic Signature(s) Signed: 04/05/2015 4:26:09 PM By: Madelaine Bhat MD Entered By: Madelaine Bhat on 04/05/2015 15:49:20 [name], [name] ([id number]) -------------------------------------------------------------------------------- Physical Exam Details [name], Date of Service: 04/05/2015 3:15  PM Patient Name: [name] Patient Account Number: [id number] Medical Record Treating RN: Curtis Sites [id number] Number: Other Clinician: Date of Birth/Sex: May 17, 1956 (59 y.o. Male) Treating BURNS III, Primary Care Physician: Physician/Extender: Zollie Beckers Referring Physician: Bayard Males Weeks in Treatment: 2 Constitutional . Pulse regular. Respirations normal and unlabored. Afebrile. . Notes Left medial ankle ulceration improved. Full-thickness. No exposed deep structures. No cellulitis. Surrounding hyperpigmentation. Multiple prominent varicosities in the distal calf and ankle. 1+ pitting edema. Palpable DP. ABI falsely elevated at 1.6. Electronic Signature(s) Signed: 04/05/2015 4:26:09 PM By: Madelaine Bhat MD  Entered By: Madelaine Bhat on 04/05/2015 15:49:47 [name], [name] ([id number]) -------------------------------------------------------------------------------- Physician Orders Details [name], Date of Service: 04/05/2015 3:15 PM Patient Name: [name] Patient Account Number: [id number] Medical Record Treating RN: Clover Mealy RN, BSN, [place] Sink [id number] Number: Other Clinician: Date of Birth/Sex: 1955-10-20 (59 y.o. Male) Treating BURNS III, Primary Care Physician: Physician/Extender: Zollie Beckers Referring Physician: Manuela Schwartz in Treatment: 2 Verbal / Phone Orders: Yes Clinician: Afful, RN, BSN, Rita Read Back and Verified: Yes Diagnosis Coding Wound Cleansing Wound #1 Left,Medial Malleolus o Clean wound with Normal Saline. Anesthetic Wound #1 Left,Medial Malleolus o Topical Lidocaine 4% cream applied to wound bed prior to debridement Primary Wound Dressing Wound #1 Left,Medial Malleolus o Prisma Ag Secondary Dressing Wound #1 Left,Medial Malleolus o Boardered Foam Dressing Dressing Change Frequency Wound #1 Left,Medial Malleolus o Change dressing every other day. Follow-up Appointments Wound #1 Left,Medial Malleolus o Return  Appointment in 2 weeks. Edema Control Wound #1 Left,Medial Malleolus o Tubigrip Electronic Signature(s) Signed: 04/05/2015 4:26:09 PM By: Madelaine Bhat MD Signed: 04/05/2015 4:45:20 PM By: Elpidio Eric BSN, RN [name], [name] ([id number]) Entered By: Elpidio Eric on 04/05/2015 15:42:11 [name], [name] ([id number]) -------------------------------------------------------------------------------- Problem List Details [name], Date of Service: 04/05/2015 3:15 PM Patient Name: [name] Patient Account Number: [id number] Medical Record Treating RN: Curtis Sites [id number] Number: Other Clinician: Date of Birth/Sex: 02/12/1956 (59 y.o. 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 Male) Treating BURNS III, Primary Care Physician: Physician/Extender: Zollie Beckers Referring Physician: Bayard Males Weeks in Treatment: 2 Subjective Chief Complaint Information obtained from Patient Chronic left calf ulcer. History of Present Illness (HPI) Pleasant 59 year old with no significant past medical history. No  history of diabetes or peripheral arterial disease. History obtained from interpreter. He says that he developed a spontaneous ulceration on his left medial ankle in July 2016. He thinks that it was secondary to a "ruptured vein". He denies any history of venous stasis ulcerations or of DVT. No ultrasound evaluation. Does not normally wear compression. Ambulating per his baseline. 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 No claudication or rest pain. Performing dressing changes with Prisma and using Tubigrip for edema control. Awaiting juxtalite. Did not think compression bandages were good option for him given his work requirements. He returns to clinic for follow-up and is without new complaints. No significant pain. No fever or chills. Minimal to no drainage. Objective Constitutional Pulse regular. Respirations normal and unlabored. Afebrile. Vitals Time Taken: 3:24 PM, Height: 76 in, Weight: 175 lbs, BMI: 21.3, Temperature: 98.4 F, Pulse: 70 bpm, Respiratory Rate: 18 breaths/min, Blood Pressure: 111/69 mmHg. General Notes: Left medial ankle ulceration improved. Full-thickness. No exposed deep structures. No cellulitis. Surrounding hyperpigmentation. Multiple prominent varicosities in the distal calf and ankle. 1+ pitting edema. Palpable DP. ABI falsely elevated at 1.6. [name], [name] ([id number]) Integumentary (Hair, Skin) Wound #1 status is  Open. Original cause of wound was Gradually Appeared. The wound is located on the Left,Medial Malleolus. The wound measures 1.7cm length x 0.4cm width x 0.1cm depth; 0.534cm^2 area and 0.053cm^3 volume. The wound is limited to skin breakdown. There is no tunneling or undermining noted. There is a small amount of serous drainage noted. The wound margin is indistinct and nonvisible. There is no granulation within the wound bed. There is a large (67-100%) amount of necrotic tissue within the wound bed including Eschar. The periwound skin  appearance exhibited: Dry/Scaly. The periwound skin appearance did not exhibit: Callus, Crepitus, Excoriation, Fluctuance, Friable, Induration, Localized Edema, Rash, Scarring, Maceration, Moist, Atrophie Blanche, Cyanosis, Ecchymosis, Hemosiderin Staining, Mottled, Pallor, Rubor, Erythema. Periwound temperature was noted as No Abnormality. 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 Procedures Wound #1 Wound #1 is a Venous Leg Ulcer located on the Left,Medial Malleolus . There was a Skin/Subcutaneous Tissue Debridement ([id number]) debridement with total area of 0.68 sq cm performed by BURNS III, Melanie Crazier., MD. with the following instrument(s): Curette to remove Non-Viable tissue/material including Exudate, Fibrin/Slough, and Subcutaneous after achieving pain control using Lidocaine 4% Topical Solution. A time out was conducted prior to the start of the procedure. A Minimum amount of bleeding was controlled with Pressure. The procedure was tolerated well with a pain level of 0 throughout and a pain level of 0 following the procedure. Post Debridement Measurements: 1.7cm length x 0.4cm width x 0.2cm depth; 0.107cm^3 volume. Post procedure Diagnosis Wound #1: Same as Pre-Procedure [name], [name] ([id number]) Plan Wound Cleansing: Wound #1 Left,Medial Malleolus: Clean wound with Normal Saline. Anesthetic: Wound #1 Left,Medial Malleolus: Topical Lidocaine 4% cream applied to wound bed prior to debridement Primary Wound Dressing: Wound #1 Left,Medial Malleolus: Prisma Ag Secondary Dressing: Wound #1 Left,Medial Malleolus: Boardered Foam Dressing Dressing Change Frequency: Wound #1 Left,Medial Malleolus: Change dressing every other day. Follow-up Appointments: Wound #1 Left,Medial  Malleolus: Return Appointment in 2 weeks. Edema Control: Wound #1 Left,Medial Malleolus: Tubigrip Prisma. Edema control with compression stocking or Tubigrip. Unable to obtain juxtalite secondary to out- of-pocket expense. We will schedule venous ultrasound to assess for chronic venous insufficiency and determine if he would be a candidate for endovenous ablation. Electronic Signature(s) Signed: 04/05/2015 4:26:09 PM By: Madelaine Bhat MD Entered By: Madelaine Bhat on 04/05/2015 15:50:48 [name], [name] ([id number]) -------------------------------------------------------------------------------- SuperBill Details [name], Date of Service: 04/05/2015 Patient Name: [name] Patient Account Number: [id number] Medical Record Treating RN: Curtis Sites [id number] Number: Other Clinician: Date of Birth/Sex: 01-30-1956 (59 y.o. 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

## 2015-04-12 ENCOUNTER — Encounter: Payer: PRIVATE HEALTH INSURANCE | Admitting: Surgery

## 2015-04-12 DIAGNOSIS — L97322 Non-pressure chronic ulcer of left ankle with fat layer exposed: Secondary | ICD-10-CM | POA: Diagnosis not present

## 2015-04-13 NOTE — Progress Notes (Signed)
[name], [name] ([id number]) Visit Report for 04/12/2015 Chief Complaint Document Details [name], Date of Service: 04/12/2015 3:00 PM Patient Name: [name] Patient Account Number: [id number] Medical Record Treating RN: Curtis Sites [id number] Number: Other Clinician: Date of Birth/Sex: 07/21/1955 (59 y.o. Male) Treating BURNS III, Primary Care Physician: Physician/Extender: Zollie Beckers Referring Physician: Bayard Males Weeks in Treatment: 3 Information Obtained from: Patient Chief Complaint Chronic left calf/ankle ulcer. Electronic Signature(s) Signed: 04/12/2015 3:24:31 PM By: Madelaine Bhat MD Entered By: Madelaine Bhat on 04/12/2015 15:20:21 [name], [name] ([id number]) -------------------------------------------------------------------------------- Debridement Details [name], Date of Service: 04/12/2015 3:00 PM Patient Name: [name] Patient Account Number: [id number] Medical Record Treating RN: Curtis Sites [id number] Number: Other Clinician: Date of Birth/Sex: 24-Jul-1955 (59 y.o. Male) Treating BURNS III, Primary Care Physician: Physician/Extender: Zollie Beckers Referring Physician: Bayard Males Weeks in Treatment: 3 Debridement Performed for Wound #1 Left,Medial Malleolus Assessment: Performed By: Physician BURNS III, Melanie Crazier., MD Debridement: Debridement Pre-procedure Yes Verification/Time Out Taken: Start Time: 15:14 Pain Control: Lidocaine 4% Topical Solution Level: Skin/Subcutaneous Tissue Total Area Debrided (L x 5.1 (cm) x 2 (cm) = 10.2 (cm) W): Tissue and other Viable, Non-Viable, Fat, Fibrin/Slough, Subcutaneous material debrided: Instrument: Curette Bleeding: Minimum Hemostasis Achieved: Pressure End Time: 15:15 Procedural Pain: 0 Post Procedural Pain: 0 Response to Treatment: Procedure was tolerated well Post Debridement Measurements of Total Wound Length: (cm) 5.1 Width: (cm) 2 Depth: (cm) 0.3 Volume: (cm) 2.403 Post  Procedure Diagnosis Same as Pre-procedure Electronic Signature(s) Signed: 04/12/2015 3:24:31 PM By: Madelaine Bhat MD Signed: 04/12/2015 4:49:25 PM By: Curtis Sites Entered By: Madelaine Bhat on 04/12/2015 15:20:03 [name], [name] ([id number]) -------------------------------------------------------------------------------- HPI Details [name], Date of Service: 04/12/2015 3:00 PM Patient Name: [name] Patient Account Number: [id number] Medical Record Treating RN: Curtis Sites [id number] Number: Other Clinician: Date of Birth/Sex: September 15, 1955 (59 y.o. Male) Treating BURNS III, Primary Care Physician: Physician/Extender: Zollie Beckers Referring Physician: Bayard Males Weeks in Treatment: 3 History of Present Illness HPI Description: Pleasant 59 year old with no significant past medical history. No history of diabetes or peripheral arterial disease. History obtained from interpreter. He says that he developed a spontaneous ulceration on his left medial ankle in July 2016. He thinks that it was secondary to a "ruptured vein". He denies any history of venous stasis ulcerations or of DVT. No ultrasound evaluation. Does not normally wear compression. Ambulating per his baseline. 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 No claudication or rest pain. Performing dressing changes with Prisma and using Tubigrip for edema control. Awaiting juxtalite. Decline compression bandages because of his work requirements. Awaiting ultrasound. He returns to clinic for follow-up and is without new complaints. No significant pain. No fever or chills. Minimal to no drainage. Electronic Signature(s) Signed: 04/12/2015 3:24:31 PM By: Madelaine Bhat MD Entered By: Madelaine Bhat on 04/12/2015 15:21:44 [name], [name] ([id number]) -------------------------------------------------------------------------------- Physical Exam Details [name], Date of Service: 04/12/2015 3:00 PM Patient  Name: [name] Patient Account Number: [id number] Medical Record Treating RN: Curtis Sites [id number] Number: Other Clinician: Date of Birth/Sex: 23-Oct-1955 (59 y.o. Male) Treating BURNS III, Primary Care Physician: Physician/Extender: Zollie Beckers Referring Physician: Bayard Males Weeks in Treatment: 3 Constitutional . Pulse regular. Respirations normal and unlabored. Afebrile. . Cardiovascular Pedal Pulses WNL. Integumentary (Hair, Skin) .Marland Kitchen Neurological Sensation normal to touch, pin,and vibration. Psychiatric Judgement and insight Intact.. Oriented times 3.. No evidence of depression, anxiety, or agitation.. Notes Left medial ankle ulceration increased in size. Full-thickness. No exposed deep structures. No cellulitis. Surrounding hyperpigmentation. Multiple  prominent varicosities in the distal calf and ankle. 1+ pitting edema. Palpable DP. ABI falsely elevated at 1.6. Electronic Signature(s) Signed: 04/12/2015 3:24:31 PM By: Madelaine Bhat MD Entered By: Madelaine Bhat on 04/12/2015 15:22:21 [name], [name] ([id number]) -------------------------------------------------------------------------------- Physician Orders Details [name], Date of Service: 04/12/2015 3:00 PM Patient Name: [name] Patient Account Number: [id number] Medical Record Treating RN: Curtis Sites [id number] Number: Other Clinician: Date of Birth/Sex: 1956/06/04 (59 y.o. Male) Treating BURNS III, Primary Care Physician: Physician/Extender: Zollie Beckers Referring Physician: Manuela Schwartz in Treatment: 3 Verbal / Phone Orders: Yes Clinician: Curtis Sites Read Back and Verified: Yes Diagnosis Coding Wound Cleansing Wound #1 Left,Medial Malleolus o Clean wound with Normal Saline. Anesthetic Wound #1 Left,Medial Malleolus o Topical Lidocaine 4% cream applied to wound bed prior to debridement Primary Wound Dressing Wound #1 Left,Medial Malleolus o Iodoflex Secondary  Dressing Wound #1 Left,Medial Malleolus o ABD pad Dressing Change Frequency Wound #1 Left,Medial Malleolus o Change dressing every other day. Follow-up Appointments Wound #1 Left,Medial Malleolus o Return Appointment in 1 week. o Nurse Visit as needed - Friday or Monday for wrap change Edema Control Wound #1 Left,Medial Malleolus o 3 Layer Compression System - Left Lower Extremity Electronic Signature(s) Signed: 04/12/2015 3:24:31 PM By: Madelaine Bhat MD Signed: 04/12/2015 4:49:25 PM By: Jacklynn Ganong, [name] ([id number]) Entered By: Curtis Sites on 04/12/2015 15:17:14 [name], [name] ([id number]) -------------------------------------------------------------------------------- Problem List Details [name], Date of Service: 04/12/2015 3:00 PM Patient Name: [name] Patient Account Number: [id number] Medical Record Treating RN: Curtis Sites [id number] Number: Other Clinician: Date of Birth/Sex: February 03, 1956 (59 y.o. 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 Male)  Treating BURNS III, Primary Care Physician: Physician/Extender: Zollie Beckers Referring Physician: Bayard Males Weeks in Treatment: 3 Subjective Chief Complaint Information obtained from Patient Chronic left calf/ankle ulcer. History of Present Illness (HPI) Pleasant 59 year old with no significant past medical history. No history of diabetes or peripheral arterial disease. History obtained from interpreter. He says that he developed a spontaneous ulceration on his left medial ankle in July 2016. He thinks that it was secondary to a "ruptured vein". He denies any history of venous stasis ulcerations or of DVT. No ultrasound evaluation. Does not normally wear compression. Ambulating per his baseline. 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 No claudication or rest pain. Performing dressing changes with Prisma and using Tubigrip for edema control. Awaiting juxtalite. Decline compression bandages because of his work requirements. Awaiting ultrasound. He returns to clinic for follow-up and is without new complaints. No significant pain. No fever or chills. Minimal to no drainage. Objective Constitutional Pulse regular. Respirations normal and unlabored. Afebrile. Vitals Time Taken: 2:58 PM, Height: 76 in, Weight: 175 lbs, BMI: 21.3, Pulse: 64 bpm, Respiratory Rate: 18 breaths/min, Blood Pressure: 111/68 mmHg. Cardiovascular Pedal Pulses WNL. Neurological [name], [name] ([id number]) Sensation normal to touch,  pin,and vibration. Psychiatric Judgement and insight Intact.. Oriented times 3.. No evidence of depression, anxiety, or agitation.. General Notes: Left medial ankle ulceration increased in size. Full-thickness. No exposed deep structures. No cellulitis. Surrounding hyperpigmentation. Multiple prominent varicosities in the distal calf and ankle. 1+ pitting edema. Palpable DP. ABI falsely elevated at 1.6. Integumentary (Hair, Skin) Wound #1 status is Open. Original cause of wound  was Gradually Appeared. The wound is located on the Left,Medial Malleolus. The wound measures 5.1cm length x 2cm width x 0.2cm depth; 8.011cm^2 area and 1.602cm^3 volume. The wound is limited to skin breakdown. There is no tunneling or undermining noted. There is a small amount of serous drainage noted. The wound margin is indistinct and nonvisible. There is medium (34-66%) pink granulation within the wound bed. There is a medium (34-66%) amount of necrotic tissue within the wound bed including Eschar. The periwound skin appearance exhibited: Dry/Scaly. The periwound skin appearance did not exhibit: Callus, Crepitus, Excoriation, Fluctuance, Friable, Induration, Localized Edema, Rash, Scarring, Maceration, Moist, Atrophie Blanche, Cyanosis, Ecchymosis, Hemosiderin Staining, Mottled, Pallor, Rubor, Erythema. Periwound temperature was noted as No Abnormality. The periwound has tenderness on palpation. 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 Procedures Wound #1 Wound #1 is a Venous Leg Ulcer located on the Left,Medial Malleolus . There was a Skin/Subcutaneous Tissue Debridement ([id number]) debridement with total area of 10.2 sq cm performed by BURNS III, Melanie Crazier., MD. with the following instrument(s): Curette to remove Viable and Non-Viable tissue/material including Fat, Fibrin/Slough, and Subcutaneous after achieving pain control using Lidocaine 4% Topical [name], [name] ([id number]) Solution. A time out was conducted prior to the start of the procedure. A Minimum amount of bleeding was controlled with Pressure. The procedure was tolerated well with a pain level of 0 throughout and a pain level of 0 following the procedure. Post Debridement Measurements: 5.1cm length x 2cm width x  0.3cm depth; 2.403cm^3 volume. Post procedure Diagnosis Wound #1: Same as Pre-Procedure Plan Wound Cleansing: Wound #1 Left,Medial Malleolus: Clean wound with Normal Saline. Anesthetic: Wound #1 Left,Medial Malleolus: Topical Lidocaine 4% cream applied to wound bed prior to debridement Primary Wound Dressing: Wound #1 Left,Medial Malleolus: Iodoflex Secondary Dressing: Wound #1 Left,Medial Malleolus: ABD pad Dressing Change Frequency: Wound #1 Left,Medial Malleolus: Change dressing every other day. Follow-up Appointments: Wound #1 Left,Medial Malleolus: Return Appointment in 1 week. Nurse Visit as needed - Friday or Monday for wrap change Edema Control: Wound #1 Left,Medial Malleolus: 3 Layer Compression System - Left Lower Extremity He has not received his juxtalite nor has he undergone ultrasound evaluation. He is clinically worse, and I have recommended compression bandages. He is in agreement today. We will start off with cadexomer iodine and a 3 layer elastic compression bandage. Return to clinic Friday or Monday. Reschedule CVI ultrasound. Electronic Signature(s) Signed: 04/12/2015 3:24:31 PM By: Madelaine Bhat MD [name], [name] ([id number]) Entered By: Madelaine Bhat on 04/12/2015 15:23:36 [name], [name] ([id number]) -------------------------------------------------------------------------------- SuperBill Details [name], Date of Service: 04/12/2015 Patient Name: [name] Patient Account Number: [id number] Medical Record Treating RN: Curtis Sites [id number] Number: Other Clinician: Date of Birth/Sex: 05-09-1956 (59 y.o. 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

## 2015-04-13 NOTE — Progress Notes (Signed)
[name], [name] ([id number]) Visit Report for 04/12/2015 Arrival Information Details Patient Name: [name], [name] Date of Service: 04/12/2015 3:00 PM Medical Record Number: [id number] Patient Account Number: [id number] Date of Birth/Sex: 1956/02/16 (59 y.o. Male) Treating RN: Curtis Sites Primary Care Physician: Other Clinician: Referring Physician: Bayard Males Treating Physician/Extender: BURNS III, Regis Bill in Treatment: 3 Visit Information History Since Last Visit Added or deleted any medications: No Patient Arrived: Ambulatory Any new allergies or adverse reactions: No Arrival Time: 14:57 Had a fall or experienced change in No Accompanied By: dtr and activities of daily living that may affect translator risk of falls: Transfer Assistance: None Signs or symptoms of abuse/neglect since last No Patient Identification Verified: Yes visito Secondary Verification Process Yes Hospitalized since last visit: No Completed: Pain Present Now: No Patient Requires Transmission- No Based Precautions: Patient Has Alerts: No Electronic Signature(s) Signed: 04/12/2015 4:49:25 PM By: Curtis Sites Entered By: Curtis Sites on 04/12/2015 14:58:40 [name], [name] ([id number]) -------------------------------------------------------------------------------- Encounter Discharge Information Details Patient Name: [name], [name] Date of Service: 04/12/2015 3:00 PM Medical Record Number: [id number] Patient Account Number: [id number] Date of Birth/Sex: 06/20/1956 (59 y.o. Male) Treating RN: Curtis Sites Primary Care Physician: Other Clinician: Referring Physician: Bayard Males Treating Physician/Extender: BURNS III, Regis Bill in Treatment: 3 Encounter Discharge Information Items Discharge Pain Level: 0 Discharge Condition: Stable Ambulatory Status: Ambulatory Discharge Destination: Home Transportation: Private Auto dtr and Accompanied  By: translator Schedule Follow-up Appointment: Yes Medication Reconciliation completed and provided to Patient/Care No Provider: Provided on Clinical Summary of Care: 04/12/2015 Form Type Recipient Paper Patient IGA Electronic Signature(s) Signed: 04/12/2015 3:32:16 PM By: Gwenlyn Perking Entered By: Gwenlyn Perking on 04/12/2015 15:32:16 [name], [name] ([id number]) -------------------------------------------------------------------------------- Lower Extremity Assessment Details Patient Name: [name], [name] Date of Service: 04/12/2015 3:00 PM Medical Record Number: [id number] Patient Account Number: [id number] Date of Birth/Sex: 1955-07-30 (59 y.o. Male) Treating RN: Curtis Sites Primary Care Physician: Other Clinician: Referring Physician: Bayard Males Treating Physician/Extender: BURNS III, WALTER Weeks in Treatment: 3 Edema Assessment Assessed: [Left: No] [Right: No] Edema: [Left: Ye] [Right: s] Vascular Assessment Pulses: Posterior Tibial Dorsalis Pedis Palpable: [Left:Yes] Extremity colors, hair growth, and conditions: Extremity Color: [Left:Mottled] Hair Growth on Extremity: [Left:Yes] Temperature of Extremity: [Left:Warm] Capillary Refill: [Left:< 3 seconds] Toe Nail Assessment Left: Right: Thick: Yes Discolored: Yes Deformed: Yes Improper Length and Hygiene: No Electronic Signature(s) Signed: 04/12/2015 4:49:25 PM By: Curtis Sites Entered By: Curtis Sites on 04/12/2015 15:02:48 [name], [name] ([id number]) -------------------------------------------------------------------------------- Multi Wound Chart Details Patient Name: [name], [name] Date of Service: 04/12/2015 3:00 PM Medical Record Number: [id number] Patient Account Number: [id number] Date of Birth/Sex: [birth year] (59 y.o. Male) Treating RN: Curtis Sites Primary Care Physician: Other Clinician: Referring Physician: Bayard Males Treating Physician/Extender: BURNS III,  WALTER Weeks in Treatment: 3 Vital Signs Height(in): 76 Pulse(bpm): 64 Weight(lbs): 175 Blood Pressure 111/68 (mmHg): Body Mass Index(BMI): 21 Temperature(F): Respiratory Rate 18 (breaths/min): Photos: [1:No Photos] [N/A:N/A] Wound Location: [1:Left Malleolus - Medial] [N/A:N/A] Wounding Event: [1:Gradually Appeared] [N/A:N/A] Primary Etiology: [1:Venous Leg Ulcer] [N/A:N/A] Date Acquired: [1:02/14/2015] [N/A:N/A] Weeks of Treatment: [1:3] [N/A:N/A] Wound Status: [1:Open] [N/A:N/A] Measurements L x W x D 5.1x2x0.2 [N/A:N/A] (cm) Area (cm) : [1:8.011] [N/A:N/A] Volume (cm) : [1:1.602] [N/A:N/A] % Reduction in Area: [1:-466.50%] [N/A:N/A] % Reduction in Volume: -1036.20% [N/A:N/A] Classification: [1:Partial Thickness] [N/A:N/A] Exudate Amount: [1:Small] [N/A:N/A] Exudate Type: [1:Serous] [N/A:N/A] Exudate Color: [1:amber] [N/A:N/A] Wound Margin: [1:Indistinct, nonvisible] [N/A:N/A] Granulation Amount: [1:Medium (34-66%)] [N/A:N/A] Granulation Quality: [1:Pink] [N/A:N/A] Necrotic Amount: [1:Medium (34-66%)] [  N/A:N/A] Necrotic Tissue: [1:Eschar] [N/A:N/A] Exposed Structures: [1:Fascia: No Fat: No Tendon: No Muscle: No Joint: No Bone: No Limited to Skin Breakdown] [N/A:N/A] Epithelialization: Small (1-33%) N/A N/A Periwound Skin Texture: Edema: No N/A N/A Excoriation: No Induration: No Callus: No Crepitus: No Fluctuance: No Friable: No Rash: No Scarring: No Periwound Skin Dry/Scaly: Yes N/A N/A Moisture: Maceration: No Moist: No Periwound Skin Color: Atrophie Blanche: No N/A N/A Cyanosis: No Ecchymosis: No Erythema: No Hemosiderin Staining: No Mottled: No Pallor: No Rubor: No Temperature: No Abnormality N/A N/A Tenderness on Yes N/A N/A Palpation: Wound Preparation: Ulcer Cleansing: N/A N/A Rinsed/Irrigated with Saline Topical Anesthetic Applied: Other: lidocaine 4% Treatment Notes Electronic Signature(s) Signed: 04/12/2015 4:49:25 PM By: Curtis Sites Entered By: Curtis Sites on 04/12/2015 15:05:39 [name], [name] ([id number]) -------------------------------------------------------------------------------- Multi-Disciplinary Care Plan Details Patient Name: [name], [name] Date of Service: 04/12/2015 3:00 PM Medical Record Number: [id number] Patient Account Number: [id number] Date of Birth/Sex: 02-Dec-1955 (60 y.o. Male) Treating RN: Curtis Sites Primary Care Physician: Other Clinician: Referring Physician: Bayard Males Treating Physician/Extender: BURNS III, Regis Bill in Treatment: 3 Active Inactive Orientation to the Wound Care Program Nursing Diagnoses: Knowledge deficit related to the wound healing center program Goals: Patient/caregiver will verbalize understanding of the Wound Healing Center Program Date Initiated: 03/22/2015 Goal Status: Active Interventions: Provide education on orientation to the wound center Notes: Venous Leg Ulcer Nursing Diagnoses: Actual venous Insuffiency (use after diagnosis is confirmed) Goals: Patient will maintain optimal edema control Date Initiated: 03/22/2015 Goal Status: Active Interventions: Assess peripheral edema status every visit. Notes: Wound/Skin Impairment Nursing Diagnoses: Impaired tissue integrity Goals: Patient/caregiver will verbalize understanding of skin care regimen Date Initiated: 03/22/2015 [name], [name] ([id number]) Goal Status: Active Ulcer/skin breakdown will heal within 14 weeks Date Initiated: 03/22/2015 Goal Status: Active Interventions: Assess ulceration(s) every visit Treatment Activities: Topical wound management initiated : 04/12/2015 Notes: Electronic Signature(s) Signed: 04/12/2015 4:49:25 PM By: Curtis Sites Entered By: Curtis Sites on 04/12/2015 15:05:31 [name], [name] ([id number]) -------------------------------------------------------------------------------- Patient/Caregiver Education Details Patient Name:  [name], [name] Date of Service: 04/12/2015 3:00 PM Medical Record Number: [id number] Patient Account Number: [id number] Date of Birth/Gender: 12-13-1955 (59 y.o. Male) Treating RN: Curtis Sites Primary Care Physician: Other Clinician: Referring Physician: Bayard Males Treating Physician/Extender: BURNS III, Regis Bill in Treatment: 3 Education Assessment Education Provided To: Patient and Caregiver Education Topics Provided Venous: Handouts: Controlling Swelling with Multilayered Compression Wraps Methods: Explain/Verbal, Printed Responses: State content correctly Electronic Signature(s) Signed: 04/12/2015 4:49:25 PM By: Curtis Sites Entered By: Curtis Sites on 04/12/2015 15:31:39 [name], [name] ([id number]) -------------------------------------------------------------------------------- Wound Assessment Details Patient Name: [name], [name] Date of Service: 04/12/2015 3:00 PM Medical Record Number: [id number] Patient Account Number: [id number] Date of Birth/Sex: June 30, 1956 (59 y.o. Male) Treating RN: Curtis Sites Primary Care Physician: Other Clinician: Referring Physician: Bayard Males Treating Physician/Extender: BURNS III, WALTER Weeks in Treatment: 3 Wound Status Wound Number: 1 Primary Etiology: Venous Leg Ulcer Wound Location: Left Malleolus - Medial Wound Status: Open Wounding Event: Gradually Appeared Date Acquired: 02/14/2015 Weeks Of Treatment: 3 Clustered Wound: No Photos Photo Uploaded By: Curtis Sites on 04/12/2015 16:53:34 Wound Measurements Length: (cm) 5.1 Width: (cm) 2 Depth: (cm) 0.2 Area: (cm) 8.011 Volume: (cm) 1.602 % Reduction in Area: -466.5% % Reduction in Volume: -1036.2% Epithelialization: Small (1-33%) Tunneling: No Undermining: No Wound Description Classification: Partial Thickness Wound Margin: Indistinct, nonvisible Exudate Amount: Small Exudate Type: Serous Exudate Color: amber Wound  Bed Granulation Amount: Medium (34-66%) Exposed Structure Granulation Quality: Pink Fascia Exposed:  No Necrotic Amount: Medium (34-66%) Fat Layer Exposed: No Necrotic Quality: Eschar Tendon Exposed: No [name], [name] ([id number]) Muscle Exposed: No Joint Exposed: No Bone Exposed: No Limited to Skin Breakdown Periwound Skin Texture Texture Color No Abnormalities Noted: No No Abnormalities Noted: No Callus: No Atrophie Blanche: No Crepitus: No Cyanosis: No Excoriation: No Ecchymosis: No Fluctuance: No Erythema: No Friable: No Hemosiderin Staining: No Induration: No Mottled: No Localized Edema: No Pallor: No Rash: No Rubor: No Scarring: No Temperature / Pain Moisture Temperature: No Abnormality No Abnormalities Noted: No Tenderness on Palpation: Yes Dry / Scaly: Yes Maceration: No Moist: No Wound Preparation Ulcer Cleansing: Rinsed/Irrigated with Saline Topical Anesthetic Applied: Other: lidocaine 4%, Treatment Notes Wound #1 (Left, Medial Malleolus) 1. Cleansed with: Clean wound with Normal Saline 2. Anesthetic Topical Lidocaine 4% cream to wound bed prior to debridement 4. Dressing Applied: Iodoflex 5. Secondary Dressing Applied ABD Pad 7. Secured with 3 Layer Compression System - Left Lower Extremity Electronic Signature(s) Signed: 04/12/2015 4:49:25 PM By: Curtis Sites Entered By: Curtis Sites on 04/12/2015 15:05:20 [name], [name] ([id number]) -------------------------------------------------------------------------------- Vitals Details Patient Name: [name], [name] Date of Service: 04/12/2015 3:00 PM Medical Record Number: [id number] Patient Account Number: [id number] Date of Birth/Sex: 04-21-1956 (59 y.o. Male) Treating RN: Curtis Sites Primary Care Physician: Other Clinician: Referring Physician: Bayard Males Treating Physician/Extender: BURNS III, WALTER Weeks in Treatment: 3 Vital Signs Time Taken: 14:58 Pulse  (bpm): 64 Height (in): 76 Respiratory Rate (breaths/min): 18 Weight (lbs): 175 Blood Pressure (mmHg): 111/68 Body Mass Index (BMI): 21.3 Reference Range: 80 - 120 mg / dl Electronic Signature(s) Signed: 04/12/2015 4:49:25 PM By: Curtis Sites Entered By: Curtis Sites on 04/12/2015 15:00:40

## 2015-04-17 ENCOUNTER — Encounter: Payer: PRIVATE HEALTH INSURANCE | Attending: Surgery | Admitting: Surgery

## 2015-04-17 DIAGNOSIS — L97322 Non-pressure chronic ulcer of left ankle with fat layer exposed: Secondary | ICD-10-CM | POA: Diagnosis present

## 2015-04-17 DIAGNOSIS — I83223 Varicose veins of left lower extremity with both ulcer of ankle and inflammation: Secondary | ICD-10-CM | POA: Diagnosis not present

## 2015-04-19 NOTE — Progress Notes (Signed)
[name], [name] ([id number]) Visit Report for 04/17/2015 Chief Complaint Document Details Patient Name: [name], [name] Date of Service: 04/17/2015 2:30 PM Medical Record Number: [id number] Patient Account Number: [id number] Date of Birth/Sex: 10/21/1955 (59 y.o. Male) Treating RN: Huel Coventry Primary Care Physician: Other Clinician: Referring Physician: Bayard Males Treating Physician/Extender: Rudene Re in Treatment: 3 Information Obtained from: Patient Chief Complaint Chronic left calf/ankle ulcer. Electronic Signature(s) Signed: 04/17/2015 3:42:05 PM By: Evlyn Kanner MD, FACS Entered By: Evlyn Kanner on 04/17/2015 15:42:05 [name], [name] ([id number]) -------------------------------------------------------------------------------- Debridement Details Patient Name: [name], [name] Date of Service: 04/17/2015 2:30 PM Medical Record Number: [id number] Patient Account Number: [id number] Date of Birth/Sex: 1956/02/06 (59 y.o. Male) Treating RN: Huel Coventry Primary Care Physician: Other Clinician: Referring Physician: Bayard Males Treating Physician/Extender: Rudene Re in Treatment: 3 Debridement Performed for Wound #1 Left,Medial Malleolus Assessment: Performed By: Physician Tristan Schroeder., MD Debridement: Debridement Pre-procedure Yes Verification/Time Out Taken: Start Time: 15:13 Pain Control: Other : lidocaine 4% Level: Skin/Subcutaneous Tissue Total Area Debrided (L x 5.7 (cm) x 1.3 (cm) = 7.41 (cm) W): Tissue and other Viable, Non-Viable, Exudate, Fibrin/Slough, Subcutaneous material debrided: Instrument: Curette Bleeding: Minimum End Time: 15:16 Procedural Pain: 1 Post Procedural Pain: 1 Response to Treatment: Procedure was tolerated well Post Debridement Measurements of Total Wound Length: (cm) 5.7 Width: (cm) 1.3 Depth: (cm) 0.2 Volume: (cm) 1.164 Post Procedure Diagnosis Same as Pre-procedure Electronic  Signature(s) Signed: 04/17/2015 3:41:58 PM By: Evlyn Kanner MD, FACS Signed: 04/18/2015 5:33:23 PM By: Elliot Gurney RN, BSN, Kim RN, BSN Entered By: Evlyn Kanner on 04/17/2015 15:41:58 [name], [name] ([id number]) -------------------------------------------------------------------------------- HPI Details Patient Name: [name], [name] Date of Service: 04/17/2015 2:30 PM Medical Record Number: [id number] Patient Account Number: [id number] Date of Birth/Sex: [birth year] (59 y.o. Male) Treating RN: Huel Coventry Primary Care Physician: Other Clinician: Referring Physician: Bayard Males Treating Physician/Extender: Rudene Re in Treatment: 3 History of Present Illness HPI Description: Pleasant 59 year old with no significant past medical history. No history of diabetes or peripheral arterial disease. History obtained from interpreter. He says that he developed a spontaneous ulceration on his left medial ankle in July 2016. He thinks that it was secondary to a "ruptured vein". He denies any history of venous stasis ulcerations or of DVT. No ultrasound evaluation. Does not normally wear compression. Ambulating per his baseline. 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 No claudication or rest pain. Performing dressing changes with Prisma and using Tubigrip for edema control. Awaiting juxtalite. Decline compression bandages because of his work requirements. Awaiting ultrasound. He returns to clinic for follow-up and is without new complaints. No significant pain. No fever or chills. Minimal to no drainage. 04/17/2015 -- he has his vascular appointment tomorrow and is going to keep the appointment. Electronic Signature(s) Signed: 04/17/2015 3:42:29 PM By: Evlyn Kanner MD, FACS Entered By: Evlyn Kanner on 04/17/2015 15:42:29 [name], [name] ([id number]) -------------------------------------------------------------------------------- Physical Exam Details Patient Name: Cameron  Bell, [name] Date of Service: 04/17/2015 2:30 PM Medical Record Number: [id number] Patient Account Number: [id number] Date of Birth/Sex: 09-02-1955 (59 y.o. Male) Treating RN: Huel Coventry Primary Care Physician: Other Clinician: Referring Physician: Bayard Males Treating Physician/Extender: Rudene Re in Treatment: 3 Constitutional . Pulse regular. Respirations normal and unlabored. Afebrile. . Eyes Nonicteric. Reactive to light. Ears, Nose, Mouth, and Throat Lips, teeth, and gums WNL.Marland Kitchen Moist mucosa without lesions . Neck supple and nontender. No palpable supraclavicular or cervical adenopathy. Normal sized without goiter. Respiratory WNL. No retractions.. Cardiovascular Pedal  Pulses WNL. No clubbing, cyanosis or edema. Chest Breasts symmetical and no nipple discharge.. Breast tissue WNL, no masses, lumps, or tenderness.. Lymphatic No adneopathy. No adenopathy. No adenopathy. Musculoskeletal Adexa without tenderness or enlargement.. Digits and nails w/o clubbing, cyanosis, infection, petechiae, ischemia, or inflammatory conditions.. Integumentary (Hair, Skin) No suspicious lesions. No crepitus or fluctuance. No peri-wound warmth or erythema. No masses.Marland Kitchen Psychiatric Judgement and insight Intact.. No evidence of depression, anxiety, or agitation.. Notes the ulceration on the left lower extremity medially has got no cellulitis but has significant slough and will need sharp debridement with a curette. Electronic Signature(s) Signed: 04/17/2015 3:43:13 PM By: Evlyn Kanner MD, FACS Entered By: Evlyn Kanner on 04/17/2015 15:43:12 [name], [name] ([id number]) -------------------------------------------------------------------------------- Physician Orders Details Patient Name: [name], [name] Date of Service: 04/17/2015 2:30 PM Medical Record Number: [id number] Patient Account Number: [id number] Date of Birth/Sex: 11-29-1955 (59 y.o. Male) Treating RN: Huel Coventry Primary Care Physician: Other Clinician: Referring Physician: Bayard Males Treating Physician/Extender: Rudene Re in Treatment: 3 Verbal / Phone Orders: Yes Clinician: Huel Coventry Read Back and Verified: Yes Diagnosis Coding Wound Cleansing Wound #1 Left,Medial Malleolus o Clean wound with Normal Saline. Anesthetic Wound #1 Left,Medial Malleolus o Topical Lidocaine 4% cream applied to wound bed prior to debridement Primary Wound Dressing Wound #1 Left,Medial Malleolus o Iodoflex Secondary Dressing Wound #1 Left,Medial Malleolus o ABD and Kerlix/Conform - Applied today in clinic due to patient having vascular procedure tomorrow. Wrap would be removed for testing. Dressing Change Frequency Wound #1 Left,Medial Malleolus o Change dressing every other day. Follow-up Appointments Wound #1 Left,Medial Malleolus o Return Appointment in 1 week. Edema Control Wound #1 Left,Medial Malleolus o Unna Boot to Right Lower Extremity - Vascular to apply after testing and remain in place until next wound care visit. o Elevate legs to the level of the heart and pump ankles as often as possible Additional Orders / Instructions Wound #1 Left,Medial Malleolus o Activity as tolerated [name], [name] ([id number]) Electronic Signature(s) Signed: 04/17/2015 4:36:30 PM By: Evlyn Kanner MD, FACS Signed: 04/18/2015 5:33:23 PM By: Elliot Gurney RN, BSN, Kim RN, BSN Entered By: Elliot Gurney, RN, BSN, Kim on 04/17/2015 15:21:35 [name], [name] ([id number]) -------------------------------------------------------------------------------- Problem List Details Patient Name: [name], [name] Date of Service: 04/17/2015 2:30 PM Medical Record Number: [id number] Patient Account Number: [id number] Date of Birth/Sex: 22-May-1956 (59 y.o. 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 Male) Treating RN: Huel Coventry Primary Care Physician: Other Clinician: Referring Physician: Bayard Males Treating Physician/Extender: Rudene Re in Treatment: 3 Subjective Chief Complaint Information obtained from Patient Chronic left calf/ankle ulcer. History of Present Illness (HPI) Pleasant 58 year old with no significant past medical history. No history of diabetes or peripheral arterial disease. History obtained from interpreter. He says that he developed a spontaneous ulceration on his left medial ankle in July 2016. He thinks that it was  secondary to a "ruptured vein". He denies any history of venous stasis ulcerations or of DVT. No ultrasound evaluation. Does not normally wear compression. Ambulating per his baseline. 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 No claudication or rest pain. Performing dressing changes with Prisma and using Tubigrip for edema control. Awaiting juxtalite. Decline compression bandages because of his work requirements. Awaiting ultrasound. He returns to clinic for follow-up and is without new complaints. No significant pain. No fever or chills. Minimal to no  drainage. 04/17/2015 -- he has his vascular appointment tomorrow and is going to keep the appointment. Objective Constitutional Pulse regular. Respirations normal and unlabored. Afebrile. Vitals Time Taken: 2:55 PM, Height: 76 in, Weight: 175 lbs, BMI: 21.3, Temperature: 98.3 F, Pulse: 65 bpm, Respiratory Rate: 20 breaths/min, Blood Pressure: 112/71 mmHg. Eyes Nonicteric. Reactive to light. Ears, Nose, Mouth, and Throat Lips, teeth, and gums WNL.Marland Kitchen Moist mucosa without lesions . [name], [name] ([id number]) Neck supple and nontender. No palpable supraclavicular or cervical adenopathy. Normal sized without goiter. Respiratory WNL. No retractions.. Cardiovascular Pedal Pulses WNL. No clubbing, cyanosis or edema. Chest Breasts symmetical and no nipple discharge.. Breast tissue WNL, no masses, lumps, or tenderness.. Lymphatic No adneopathy. No adenopathy. No adenopathy. Musculoskeletal Adexa without tenderness or enlargement.. Digits and nails w/o clubbing, cyanosis, infection, petechiae, ischemia, or inflammatory conditions.Marland Kitchen Psychiatric Judgement and insight Intact.. No evidence of depression, anxiety, or agitation.. General Notes: the ulceration on the left lower extremity medially has got no cellulitis but has significant slough and will need sharp debridement with a curette. Integumentary (Hair, Skin) No suspicious lesions. No crepitus or fluctuance. No peri-wound warmth or erythema. No masses.. Wound #1 status is Open. Original cause of wound was Gradually Appeared. The wound is located on the Left,Medial Malleolus. The wound measures 5.7cm length x 1.3cm width x 0.2cm depth; 5.82cm^2 area and 1.164cm^3 volume. The wound is limited to skin breakdown. There is no tunneling or undermining noted. There is a small amount of serous drainage noted. The wound margin is indistinct and nonvisible. There is medium (34-66%) pink granulation within the wound bed. There is a medium  (34-66%) amount of necrotic tissue within the wound bed including Eschar and Adherent Slough. The periwound skin appearance exhibited: Dry/Scaly. The periwound skin appearance did not exhibit: Callus, Crepitus, Excoriation, Fluctuance, Friable, Induration, Localized Edema, Rash, Scarring, Maceration, Moist, Atrophie Blanche, Cyanosis, Ecchymosis, Hemosiderin Staining, Mottled, Pallor, Rubor, Erythema. Periwound temperature was noted as No Abnormality. The periwound has tenderness on palpation. 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 Last week he removed his wrap on Saturday and has been just applying a Tubigrip. I have also impressed upon him the need to keep his vascular surgery appointment tomorrow. He will see Dr. Lawerance Bach on Wednesday. Procedures Wound #1 Wound #1 is a Venous Leg Ulcer located on the Left,Medial Malleolus . There was a Skin/Subcutaneous Tissue Debridement ([id number]) debridement with total area of 7.41 sq cm performed by Juanice Warburton, Ignacia Felling., MD. with the following instrument(s): Curette to remove Viable and Non-Viable tissue/material including Exudate, Fibrin/Slough, and Subcutaneous after achieving pain control using Other (lidocaine 4%). A time out was conducted prior to the start of the procedure. A Minimum amount of bleeding was controlled  with N/A. The procedure was tolerated well with a pain level of 1 throughout and a pain level of 1 following the procedure. Post Debridement Measurements: 5.7cm length x 1.3cm width x 0.2cm depth; 1.164cm^3 volume. Post procedure Diagnosis Wound #1: Same as Pre-Procedure Plan Wound Cleansing: Wound #1 Left,Medial Malleolus: Clean wound  with Normal Saline. Anesthetic: Wound #1 Left,Medial Malleolus: Topical Lidocaine 4% cream applied to wound bed prior to debridement Primary Wound Dressing: Wound #1 Left,Medial Malleolus: Iodoflex Secondary Dressing: Wound #1 Left,Medial Malleolus: ABD and Kerlix/Conform - Applied today in clinic due to patient having vascular procedure tomorrow. Wrap would be removed for testing. Dressing Change Frequency: Wound #1 Left,Medial Malleolus: Change dressing every other day. [name], [name] ([id number]) Follow-up Appointments: Wound #1 Left,Medial Malleolus: Return Appointment in 1 week. Edema Control: Wound #1 Left,Medial Malleolus: Unna Boot to Right Lower Extremity - Vascular to apply after testing and remain in place until next wound care visit. Elevate legs to the level of the heart and pump ankles as often as possible Additional Orders / Instructions: Wound #1 Left,Medial Malleolus: Activity as tolerated We will do his dressing as before and I have impressed upon him the need to keep his compression wraps on. Last week he removed his wrap on Saturday and has been just applying a Tubigrip. I have also impressed upon him the need to keep his vascular surgery appointment tomorrow. He will see Dr. Lawerance Bach on Wednesday. Electronic Signature(s) Signed: 04/17/2015 3:45:06 PM By: Evlyn Kanner MD, FACS Entered By: Evlyn Kanner on 04/17/2015 15:45:05 [name], [name] ([id number]) -------------------------------------------------------------------------------- SuperBill Details Patient Name: [name], [name] Date of Service: 04/17/2015 Medical Record Number: [id number] Patient Account Number: [id number] Date of Birth/Sex: [birth year] (59 y.o. 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

## 2015-04-19 NOTE — Progress Notes (Signed)
[name], [name] ([id number]) Visit Report for 04/17/2015 Arrival Information Details Patient Name: [name], [name] Date of Service: 04/17/2015 2:30 PM Medical Record Number: [id number] Patient Account Number: [id number] Date of Birth/Sex: Jul 23, 1955 (59 y.o. Male) Treating RN: Renee Harder Primary Care Physician: Other Clinician: Referring Physician: Bayard Males Treating Physician/Extender: Rudene Re in Treatment: 3 Visit Information History Since Last Visit Added or deleted any medications: No Patient Arrived: Ambulatory Any new allergies or adverse reactions: No Arrival Time: 14:51 Had a fall or experienced change in No Accompanied By: interpreter activities of daily living that may affect Transfer Assistance: None risk of falls: Patient Identification Verified: Yes Signs or symptoms of abuse/neglect since last No Secondary Verification Process Yes visito Completed: Hospitalized since last visit: No Patient Requires Transmission-Based No Has Dressing in Place as Prescribed: Yes Precautions: Has Compression in Place as Prescribed: Yes Patient Has Alerts: No Pain Present Now: Yes Electronic Signature(s) Signed: 04/17/2015 4:32:26 PM By: Renee Harder RN Entered By: Renee Harder on 04/17/2015 14:52:35 [name], [name] ([id number]) -------------------------------------------------------------------------------- Encounter Discharge Information Details Patient Name: [name], [name] Date of Service: 04/17/2015 2:30 PM Medical Record Number: [id number] Patient Account Number: [id number] Date of Birth/Sex: 19-Jul-1955 (59 y.o. Male) Treating RN: Huel Coventry Primary Care Physician: Other Clinician: Referring Physician: Bayard Males Treating Physician/Extender: Rudene Re in Treatment: 3 Encounter Discharge Information Items Discharge Condition: Stable Ambulatory Status: Ambulatory Discharge Destination: Home Transportation: Private  Auto Accompanied By: interpreter Schedule Follow-up Appointment: Yes Medication Reconciliation completed and provided to Patient/Care No Yves Fodor: Provided on Clinical Summary of Care: 04/17/2015 Form Type Recipient Paper Patient IAG Electronic Signature(s) Signed: 04/17/2015 4:32:26 PM By: Renee Harder RN Previous Signature: 04/17/2015 3:30:18 PM Version By: Gwenlyn Perking Entered By: Renee Harder on 04/17/2015 15:37:12 [name], [name] ([id number]) -------------------------------------------------------------------------------- Lower Extremity Assessment Details Patient Name: [name], [name] Date of Service: 04/17/2015 2:30 PM Medical Record Number: [id number] Patient Account Number: [id number] Date of Birth/Sex: 12-16-1955 (59 y.o. Male) Treating RN: Renee Harder Primary Care Physician: Other Clinician: Referring Physician: Bayard Males Treating Physician/Extender: Rudene Re in Treatment: 3 Edema Assessment Assessed: [Left: Yes] [Right: No] Edema: [Left: Ye] [Right: s] Calf Left: Right: Point of Measurement: 36 cm From Medial Instep 37.4 cm cm Ankle Left: Right: Point of Measurement: 13 cm From Medial Instep 22.5 cm cm Vascular Assessment Claudication: Claudication Assessment [Left:None] [Right:None] Pulses: Posterior Tibial Palpable: [Left:Yes] [Right:Yes] Dorsalis Pedis Palpable: [Left:Yes] [Right:Yes] Extremity colors, hair growth, and conditions: Hair Growth on Extremity: [Left:Yes] [Right:Yes] Temperature of Extremity: [Left:Warm] [Right:Warm] Capillary Refill: [Left:< 3 seconds] [Right:< 3 seconds] Dependent Rubor: [Left:No] [Right:No] Blanched when Elevated: [Left:No] [Right:No] Lipodermatosclerosis: [Left:No] Toe Nail Assessment Left: Right: Thick: Yes Discolored: Yes Deformed: Yes Improper Length and Hygiene: Yes [name], [name] ([id number]) Electronic Signature(s) Signed: 04/17/2015 4:32:26 PM By: Renee Harder RN Entered  By: Renee Harder on 04/17/2015 14:58:07 [name], [name] ([id number]) -------------------------------------------------------------------------------- Multi Wound Chart Details Patient Name: [name], [name] Date of Service: 04/17/2015 2:30 PM Medical Record Number: [id number] Patient Account Number: [id number] Date of Birth/Sex: [birth year] (59 y.o. Male) Treating RN: Huel Coventry Primary Care Physician: Other Clinician: Referring Physician: Bayard Males Treating Physician/Extender: Rudene Re in Treatment: 3 Vital Signs Height(in): 76 Pulse(bpm): 65 Weight(lbs): 175 Blood Pressure 112/71 (mmHg): Body Mass Index(BMI): 21 Temperature(F): 98.3 Respiratory Rate 20 (breaths/min): Photos: [1:No Photos] [N/A:N/A] Wound Location: [1:Left Malleolus - Medial] [N/A:N/A] Wounding Event: [1:Gradually Appeared] [N/A:N/A] Primary Etiology: [1:Venous Leg Ulcer] [N/A:N/A] Date Acquired: [1:02/14/2015] [N/A:N/A] Weeks of Treatment: [1:3] [N/A:N/A] Wound  Status: [1:Open] [N/A:N/A] Measurements L x W x D 5.7x1.3x0.2 [N/A:N/A] (cm) Area (cm) : [1:5.82] [N/A:N/A] Volume (cm) : [1:1.164] [N/A:N/A] % Reduction in Area: [1:-311.60%] [N/A:N/A] % Reduction in Volume: -725.50% [N/A:N/A] Classification: [1:Partial Thickness] [N/A:N/A] Exudate Amount: [1:Small] [N/A:N/A] Exudate Type: [1:Serous] [N/A:N/A] Exudate Color: [1:amber] [N/A:N/A] Wound Margin: [1:Indistinct, nonvisible] [N/A:N/A] Granulation Amount: [1:Medium (34-66%)] [N/A:N/A] Granulation Quality: [1:Pink] [N/A:N/A] Necrotic Amount: [1:Medium (34-66%)] [N/A:N/A] Necrotic Tissue: [1:Eschar, Adherent Slough] [N/A:N/A] Exposed Structures: [1:Fascia: No Fat: No Tendon: No Muscle: No Joint: No Bone: No Limited to Skin Breakdown] [N/A:N/A] Epithelialization: Small (1-33%) N/A N/A Periwound Skin Texture: Edema: No N/A N/A Excoriation: No Induration: No Callus: No Crepitus: No Fluctuance: No Friable: No Rash:  No Scarring: No Periwound Skin Dry/Scaly: Yes N/A N/A Moisture: Maceration: No Moist: No Periwound Skin Color: Atrophie Blanche: No N/A N/A Cyanosis: No Ecchymosis: No Erythema: No Hemosiderin Staining: No Mottled: No Pallor: No Rubor: No Temperature: No Abnormality N/A N/A Tenderness on Yes N/A N/A Palpation: Wound Preparation: Ulcer Cleansing: N/A N/A Rinsed/Irrigated with Saline Topical Anesthetic Applied: Other: lidocaine 4% Treatment Notes Electronic Signature(s) Signed: 04/18/2015 5:33:23 PM By: Elliot Gurney, RN, BSN, Kim RN, BSN Entered By: Elliot Gurney, RN, BSN, Kim on 04/17/2015 15:12:49 [name], [name] ([id number]) -------------------------------------------------------------------------------- Multi-Disciplinary Care Plan Details Patient Name: [name], [name] Date of Service: 04/17/2015 2:30 PM Medical Record Number: [id number] Patient Account Number: [id number] Date of Birth/Sex: 1956/06/05 (59 y.o. Male) Treating RN: Huel Coventry Primary Care Physician: Other Clinician: Referring Physician: Bayard Males Treating Physician/Extender: Rudene Re in Treatment: 3 Active Inactive Orientation to the Wound Care Program Nursing Diagnoses: Knowledge deficit related to the wound healing center program Goals: Patient/caregiver will verbalize understanding of the Wound Healing Center Program Date Initiated: 03/22/2015 Goal Status: Active Interventions: Provide education on orientation to the wound center Notes: Venous Leg Ulcer Nursing Diagnoses: Actual venous Insuffiency (use after diagnosis is confirmed) Goals: Patient will maintain optimal edema control Date Initiated: 03/22/2015 Goal Status: Active Interventions: Assess peripheral edema status every visit. Notes: Wound/Skin Impairment Nursing Diagnoses: Impaired tissue integrity Goals: Patient/caregiver will verbalize understanding of skin care regimen Date Initiated: 03/22/2015 [name], [name]  ([id number]) Goal Status: Active Ulcer/skin breakdown will heal within 14 weeks Date Initiated: 03/22/2015 Goal Status: Active Interventions: Assess ulceration(s) every visit Treatment Activities: Topical wound management initiated : 04/17/2015 Notes: Electronic Signature(s) Signed: 04/18/2015 5:33:23 PM By: Elliot Gurney, RN, BSN, Kim RN, BSN Entered By: Elliot Gurney, RN, BSN, Kim on 04/17/2015 15:12:42 [name], [name] ([id number]) -------------------------------------------------------------------------------- Pain Assessment Details Patient Name: [name], [name] Date of Service: 04/17/2015 2:30 PM Medical Record Number: [id number] Patient Account Number: [id number] Date of Birth/Sex: [birth year] (59 y.o. Male) Treating RN: Renee Harder Primary Care Physician: Other Clinician: Referring Physician: Bayard Males Treating Physician/Extender: Rudene Re in Treatment: 3 Active Problems Location of Pain Severity and Description of Pain Patient Has Paino Yes Site Locations Pain Location: Pain in Ulcers With Dressing Change: Yes Duration of the Pain. Constant / Intermittento Constant Rate the pain. Current Pain Level: 8 Worst Pain Level: 10 Least Pain Level: 3 Character of Pain Describe the Pain: Burning Pain Management and Medication Current Pain Management: Electronic Signature(s) Signed: 04/17/2015 4:32:26 PM By: Renee Harder RN Entered By: Renee Harder on 04/17/2015 14:54:27 [name], [name] ([id number]) -------------------------------------------------------------------------------- Patient/Caregiver Education Details Patient Name: [name], [name] Date of Service: 04/17/2015 2:30 PM Medical Record Number: [id number] Patient Account Number: [id number] Date of Birth/Gender: 10-29-1955 (59 y.o. Male) Treating RN: Renee Harder Primary Care Physician: Other Clinician: Referring Physician: Bayard Males  Treating Physician/Extender: Rudene Re in  Treatment: 3 Education Assessment Education Provided To: Patient Education Topics Provided Venous: Methods: Explain/Verbal Responses: State content correctly Wound Debridement: Methods: Explain/Verbal Responses: State content correctly Wound/Skin Impairment: Methods: Explain/Verbal Responses: State content correctly Electronic Signature(s) Signed: 04/17/2015 4:32:26 PM By: Renee Harder RN Entered By: Renee Harder on 04/17/2015 15:37:44 [name], [name] ([id number]) -------------------------------------------------------------------------------- Wound Assessment Details Patient Name: [name], [name] Date of Service: 04/17/2015 2:30 PM Medical Record Number: [id number] Patient Account Number: [id number] Date of Birth/Sex: June 29, 1956 (59 y.o. Male) Treating RN: Renee Harder Primary Care Physician: Other Clinician: Referring Physician: Bayard Males Treating Physician/Extender: Rudene Re in Treatment: 3 Wound Status Wound Number: 1 Primary Etiology: Venous Leg Ulcer Wound Location: Left Malleolus - Medial Wound Status: Open Wounding Event: Gradually Appeared Date Acquired: 02/14/2015 Weeks Of Treatment: 3 Clustered Wound: No Photos Photo Uploaded By: Renee Harder on 04/17/2015 16:37:39 Wound Measurements Length: (cm) 5.7 Width: (cm) 1.3 Depth: (cm) 0.2 Area: (cm) 5.82 Volume: (cm) 1.164 % Reduction in Area: -311.6% % Reduction in Volume: -725.5% Epithelialization: Small (1-33%) Tunneling: No Undermining: No Wound Description Classification: Partial Thickness Wound Margin: Indistinct, nonvisible Exudate Amount: Small Exudate Type: Serous Exudate Color: amber Wound Bed Granulation Amount: Medium (34-66%) Exposed Structure Granulation Quality: Pink Fascia Exposed: No Necrotic Amount: Medium (34-66%) Fat Layer Exposed: No Necrotic Quality: Eschar, Adherent Slough Tendon Exposed: No [name], [name] ([id number]) Muscle Exposed:  No Joint Exposed: No Bone Exposed: No Limited to Skin Breakdown Periwound Skin Texture Texture Color No Abnormalities Noted: No No Abnormalities Noted: No Callus: No Atrophie Blanche: No Crepitus: No Cyanosis: No Excoriation: No Ecchymosis: No Fluctuance: No Erythema: No Friable: No Hemosiderin Staining: No Induration: No Mottled: No Localized Edema: No Pallor: No Rash: No Rubor: No Scarring: No Temperature / Pain Moisture Temperature: No Abnormality No Abnormalities Noted: No Tenderness on Palpation: Yes Dry / Scaly: Yes Maceration: No Moist: No Wound Preparation Ulcer Cleansing: Rinsed/Irrigated with Saline Topical Anesthetic Applied: Other: lidocaine 4%, Treatment Notes Wound #1 (Left, Medial Malleolus) 1. Cleansed with: Clean wound with Normal Saline 2. Anesthetic Topical Lidocaine 4% cream to wound bed prior to debridement 4. Dressing Applied: Iodosorb Ointment 5. Secondary Dressing Applied Guaze, ABD and kerlix/Conform 7. Secured with International aid/development worker) Signed: 04/17/2015 4:32:26 PM By: Renee Harder RN Entered By: Renee Harder on 04/17/2015 15:02:21 [name], [name] ([id number]) -------------------------------------------------------------------------------- Vitals Details Patient Name: [name], [name] Date of Service: 04/17/2015 2:30 PM Medical Record Number: [id number] Patient Account Number: [id number] Date of Birth/Sex: [birth year] (59 y.o. Male) Treating RN: Renee Harder Primary Care Physician: Other Clinician: Referring Physician: Bayard Males Treating Physician/Extender: Rudene Re in Treatment: 3 Vital Signs Time Taken: 14:55 Temperature (F): 98.3 Height (in): 76 Pulse (bpm): 65 Weight (lbs): 175 Respiratory Rate (breaths/min): 20 Body Mass Index (BMI): 21.3 Blood Pressure (mmHg): 112/71 Reference Range: 80 - 120 mg / dl Electronic Signature(s) Signed: 04/17/2015 4:32:26 PM By: Renee Harder  RN Entered By: Renee Harder on 04/17/2015 14:54:57

## 2015-04-26 ENCOUNTER — Encounter: Payer: PRIVATE HEALTH INSURANCE | Admitting: Surgery

## 2015-04-26 DIAGNOSIS — L97322 Non-pressure chronic ulcer of left ankle with fat layer exposed: Secondary | ICD-10-CM | POA: Diagnosis not present

## 2015-04-27 NOTE — Progress Notes (Signed)
[name], [name] ([id number]) Visit Report for 04/26/2015 Chief Complaint Document Details [name], Date of Service: 04/26/2015 3:00 PM Patient Name: [name] Patient Account Number: [id number] Medical Record Treating RN: [name] [id number] Number: Other Clinician: Date of Birth/Sex: [birth year] (59 y.o. Male) Treating [name] III, Primary Care Physician: Physician/Extender: [name] Referring Physician: Bayard Males Bell in Treatment: 5 Information Obtained from: Patient Chief Complaint Chronic left calf/ankle ulcer. Electronic Signature(s) Signed: 04/26/2015 3:35:09 PM By: Cameron Bhat MD Entered By: [name] on 04/26/2015 15:30:32 [name], [name] ([id number]) -------------------------------------------------------------------------------- HPI Details [name], Date of Service: 04/26/2015 3:00 PM Patient Name: [name] Patient Account Number: [id number] Medical Record Treating RN: [name] [id number] Number: Other Clinician: Date of Birth/Sex: 02/10/1956 (59 y.o. Male) Treating [name] III, Primary Care Physician: Physician/Extender: [name] Referring Physician: Bayard Males Bell in Treatment: 5 History of Present Illness HPI Description: Pleasant 59 year old with no significant past medical history. No history of diabetes or peripheral arterial disease. History obtained from interpreter. He says that he developed a spontaneous ulceration on his left medial ankle in July 2016. He thinks that it was secondary to a "ruptured vein". He denies any history of venous stasis ulcerations or of DVT. No ultrasound evaluation. Does not normally wear compression. Ambulating per his baseline. 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 No claudication or rest pain. Underwent ultrasound on 04/18/2015 at [facility]. No DVT. No evidence for venous insufficiency. Foam sclerotherapy recommended. Performing dressing changes with Prisma and  using Tubigrip for edema control. Unna boot applied, but he removed it. He returns to clinic for follow-up and is without new complaints. No significant pain. No fever or chills. Minimal to no drainage. Declined debridement and compression bandage today. Prefers medihoney. Electronic Signature(s) Signed: 04/26/2015 3:35:09 PM By: Cameron Bhat MD Entered By: [name] on 04/26/2015 15:32:14 [name], [name] ([id number]) -------------------------------------------------------------------------------- Physical Exam Details [name], Date of Service: 04/26/2015 3:00 PM Patient Name: [name] Patient Account Number: [id number] Medical Record Treating RN: [name] [id number] Number: Other Clinician: Date of Birth/Sex: May 24, 1956 (59 y.o. Male) Treating [name] III, Primary Care Physician: Physician/Extender: [name] Referring Physician: Bayard Males Bell in Treatment: 5 Constitutional . Pulse regular. Respirations normal and unlabored. Afebrile. . Notes Left medial calf/ankle ulcerations. Full-thickness. No active infection. Minimal edema. Prominent varicosities. Palpable DP. Electronic Signature(s) Signed: 04/26/2015 3:35:09 PM By: Cameron Bhat MD Entered By: [name] on 04/26/2015 15:32:55 [name], [name] ([id number]) -------------------------------------------------------------------------------- Physician Orders Details [name], Date of Service: 04/26/2015 3:00 PM Patient Name: [name] Patient Account Number: [id number] Medical Record Treating RN: [name] [id number] Number: Other Clinician: Date of Birth/Sex: April 03, 1956 (59 y.o. Male) Treating [name] III, Primary Care Physician: Physician/Extender: [name] Referring Physician: Manuela Bell in Treatment: 5 Verbal / Phone Orders: Yes Clinician: Curtis Bell Read Back and Verified: Yes Diagnosis Coding Wound Cleansing Wound #1 Left,Medial Malleolus o Clean  wound with Normal Saline. Anesthetic Wound #1 Left,Medial Malleolus o Topical Lidocaine 4% cream applied to wound bed prior to debridement Primary Wound Dressing Wound #1 Left,Medial Malleolus o Medihoney gel Secondary Dressing Wound #1 Left,Medial Malleolus o ABD and Kerlix/Conform Dressing Change Frequency Wound #1 Left,Medial Malleolus o Change dressing every other day. Follow-up Appointments Wound #1 Left,Medial Malleolus o Return Appointment in 2 Bell. Edema Control Wound #1 Left,Medial Malleolus o Elevate legs to the level of the heart and pump ankles as often as possible o Tubigrip Additional Orders / Instructions Wound #1 Left,Medial Malleolus o Activity as  tolerated [name], [name] ([id number]) Electronic Signature(s) Signed: 04/26/2015 3:35:09 PM By: Cameron BhatBurns, III, Cameron Darley MD Signed: 04/26/2015 5:02:31 PM By: Cameron Sitesorthy, Cameron Entered By: Cameron Sitesorthy, Cameron on 04/26/2015 15:24:48 [name], [name] ([id number]) -------------------------------------------------------------------------------- Problem List Details Cameron PollackACUNA Bell, Date of Service: 04/26/2015 3:00 PM Patient Name: [name] Patient Account Number: [id number] Medical Record Treating RN: Cameron SitesDorthy, Cameron [id number] Number: Other Clinician: Date of Birth/Sex: 05/04/1956 (59 y.o. 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/22/2015 Yes exposed Inactive Problems Resolved Problems Electronic Signature(s) Signed: 04/26/2015 3:35:09 PM By: Cameron BhatBurns, III, Cameron Glauser MD Entered By: Cameron BhatBurns, III, [name] on 04/26/2015 15:30:23 [name], [name]  ([id number]) -------------------------------------------------------------------------------- Progress Note Details Cameron PollackACUNA Bell, Date of Service: 04/26/2015 3:00 PM Patient Name: [name] Patient Account Number: [id number] Medical Record Treating RN: Cameron SitesDorthy, Cameron [id number] Number: Other Clinician: Date of Birth/Sex: 05/04/1956 [age] y.o. Male) Treating [name] III, Primary Care Physician: Physician/Extender: Cameron BeckersWALTER Referring Physician: Bayard MalesBROWN, [place] Bell in Treatment: 5 Subjective Chief Complaint Information obtained from Patient Chronic left calf/ankle ulcer. History of Present Illness (HPI) Pleasant 59 year old with no significant past medical history. No history of diabetes or peripheral arterial disease. History obtained from interpreter. He says that he developed a spontaneous ulceration on his left medial ankle in July 2016. He thinks that it was secondary to a "ruptured vein". He denies any history of venous stasis ulcerations or of DVT. No ultrasound evaluation. Does not normally wear compression. Ambulating per his baseline. 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 No claudication or rest pain. Underwent ultrasound on 04/18/2015 at [facility]. No DVT. No evidence for venous insufficiency. Foam sclerotherapy recommended. Performing dressing changes with Prisma and using Tubigrip for edema control. Unna boot applied, but he removed it. He returns to clinic for follow-up and is without new complaints. No significant pain. No fever or chills. Minimal to no drainage. Declined debridement and compression bandage today. Prefers medihoney. Objective Constitutional Pulse regular. Respirations normal and unlabored. Afebrile. Vitals Time Taken: 3:04 PM, Height: 76 in, Weight: 175 lbs, BMI: 21.3, Pulse: 75 bpm, Respiratory Rate: 18 breaths/min, Blood Pressure: 97/70 mmHg. Cameron PollackACUNA Bell, [name] ([id number]) General Notes: Left medial calf/ankle ulcerations.  Full-thickness. No active infection. Minimal edema. Prominent varicosities. Palpable DP. Integumentary (Hair, Skin) Wound #1 status is Open. Original cause of wound was Gradually Appeared. The wound is located on the Left,Medial Malleolus. The wound measures 5.7cm length x 1.2cm width x 0.2cm depth; 5.372cm^2 area and 1.074cm^3 volume. The wound is limited to skin breakdown. There is no tunneling or undermining noted. There is a small amount of serous drainage noted. The wound margin is indistinct and nonvisible. There is medium (34-66%) pink granulation within the wound bed. There is a medium (34-66%) amount of necrotic tissue within the wound bed including Eschar and Adherent Slough. The periwound skin appearance exhibited: Dry/Scaly. The periwound skin appearance did not exhibit: Callus, Crepitus, Excoriation, Fluctuance, Friable, Induration, Localized Edema, Rash, Scarring, Maceration, Moist, Atrophie Blanche, Cyanosis, Ecchymosis, Hemosiderin Staining, Mottled, Pallor, Rubor, Erythema. Periwound temperature was noted as No Abnormality. The periwound has tenderness on palpation. 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 Plan Wound Cleansing: Wound #1 Left,Medial Malleolus: Clean wound with Normal Saline. Anesthetic: Wound #1 Left,Medial Malleolus: Topical Lidocaine 4% cream applied to wound bed prior to debridement Primary Wound Dressing: Wound #1 Left,Medial Malleolus: Medihoney gel Secondary Dressing: Wound #1 Left,Medial Malleolus: ABD and Kerlix/Conform Dressing Change Frequency: Wound #1 Left,Medial Malleolus: [name], [name] ([id number]) Change dressing every other day. Follow-up Appointments: Wound #1 Left,Medial Malleolus: Return Appointment in 2 Bell. Edema  Control: Wound #1 Left,Medial Malleolus: Elevate legs to the level of the heart and pump ankles as often as possible Tubigrip Additional Orders / Instructions: Wound #1 Left,Medial Malleolus: Activity as tolerated Medihoney. Tubigrip. Awaiting appt for foam sclerotherapy. Electronic Signature(s) Signed: 04/26/2015 3:35:09 PM By: Cameron Bhat MD Entered By: [name] on 04/26/2015 15:33:40 [name], [name]) -------------------------------------------------------------------------------- SuperBill Details [name], Date of Service: 04/26/2015 Patient Name: [name] Patient Account Number: [id number] Medical Record Treating RN: [name] [id number] Number: Other Clinician: Date of Birth/Sex: 05-19-1956 (59 y.o. 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

## 2015-04-27 NOTE — Progress Notes (Signed)
[name] ([id number]) Visit Report for 04/26/2015 Arrival Information Details Patient Name: [name] Date of Service: 04/26/2015 3:00 PM Medical Record Number: [id number] Patient Account Number: [id number] Date of Birth/Sex: 1955/10/29 (59 y.o. Male) Treating RN: Curtis Sites Primary Care Physician: Other Clinician: Referring Physician: Bayard Males Treating Physician/Extender: BURNS III, Regis Bill in Treatment: 5 Visit Information History Since Last Visit Added or deleted any medications: No Patient Arrived: Ambulatory Any new allergies or adverse reactions: No Arrival Time: 15:00 Had a fall or experienced change in No Accompanied By: translator activities of daily living that may affect Transfer Assistance: None risk of falls: Patient Identification Verified: Yes Signs or symptoms of abuse/neglect since last No Secondary Verification Process Yes visito Completed: Hospitalized since last visit: No Patient Requires Transmission-Based No Pain Present Now: No Precautions: Patient Has Alerts: No Electronic Signature(s) Signed: 04/26/2015 5:02:31 PM By: Curtis Sites Entered By: Curtis Sites on 04/26/2015 15:00:46 [name] ([id number]) -------------------------------------------------------------------------------- Clinic Level of Care Assessment Details Patient Name: [name], [name] Date of Service: 04/26/2015 3:00 PM Medical Record Number: [id number] Patient Account Number: [id number] Date of Birth/Sex: Feb 26, 1956 (59 y.o. Male) Treating RN: Curtis Sites Primary Care Physician: Other Clinician: Referring Physician: Bayard Males Treating Physician/Extender: BURNS III, Regis Bill in Treatment: 5 Clinic Level of Care Assessment Items TOOL 4 Quantity Score  - Use when only an EandM is performed on FOLLOW-UP visit 0 ASSESSMENTS - Nursing Assessment / Reassessment X - Reassessment of Co-morbidities (includes updates  in patient status) 1 10 X - Reassessment of Adherence to Treatment Plan 1 5 ASSESSMENTS - Wound and Skin Assessment / Reassessment X - Simple Wound Assessment / Reassessment - one wound 1 5  - Complex Wound Assessment / Reassessment - multiple wounds 0  - Dermatologic / Skin Assessment (not related to wound area) 0 ASSESSMENTS - Focused Assessment  - Circumferential Edema Measurements - multi extremities 0  - Nutritional Assessment / Counseling / Intervention 0 X - Lower Extremity Assessment (monofilament, tuning fork, pulses) 1 5  - Peripheral Arterial Disease Assessment (using hand held doppler) 0 ASSESSMENTS - Ostomy and/or Continence Assessment and Care  - Incontinence Assessment and Management 0  - Ostomy Care Assessment and Management (repouching, etc.) 0 PROCESS - Coordination of Care X - Simple Patient / Family Education for ongoing care 1 15  - Complex (extensive) Patient / Family Education for ongoing care 0  - Staff obtains Chiropractor, Records, Test Results / Process Orders 0  - Staff telephones HHA, Nursing Homes / Clarify orders / etc 0  - Routine Transfer to another Facility (non-emergent condition) 0 [name] ([id number])  - Routine Hospital Admission (non-emergent condition) 0  - New Admissions / Manufacturing engineer / Ordering NPWT, Apligraf, etc. 0  - Emergency Hospital Admission (emergent condition) 0 X - Simple Discharge Coordination 1 10  - Complex (extensive) Discharge Coordination 0 PROCESS - Special Needs  - Pediatric / Minor Patient Management 0  - Isolation Patient Management 0  - Hearing / Language / Visual special needs 0  - Assessment of Community assistance (transportation, D/C planning, etc.) 0  - Additional assistance / Altered mentation 0  - Support Surface(s) Assessment (bed, cushion, seat, etc.) 0 INTERVENTIONS - Wound Cleansing / Measurement X - Simple Wound Cleansing - one wound 1 5  -  Complex Wound Cleansing - multiple wounds 0 X - Wound Imaging (photographs - any number of wounds) 1 5  - Wound Tracing (instead of photographs) 0 X - Simple  Wound Measurement - one wound 1 5 []  - Complex Wound Measurement - multiple wounds 0 INTERVENTIONS - Wound Dressings X - Small Wound Dressing one or multiple wounds 1 10 []  - Medium Wound Dressing one or multiple wounds 0 []  - Large Wound Dressing one or multiple wounds 0 []  - Application of Medications - topical 0 []  - Application of Medications - injection 0 INTERVENTIONS - Miscellaneous []  - External ear exam 0 [name] ([id number]) []  - Specimen Collection (cultures, biopsies, blood, body fluids, etc.) 0 []  - Specimen(s) / Culture(s) sent or taken to Lab for analysis 0 []  - Patient Transfer (multiple staff / Michiel Sites Lift / Similar devices) 0 []  - Simple Staple / Suture removal (25 or less) 0 []  - Complex Staple / Suture removal (26 or more) 0 []  - Hypo / Hyperglycemic Management (close monitor of Blood Glucose) 0 []  - Ankle / Brachial Index (ABI) - do not check if billed separately 0 X - Vital Signs 1 5 Has the patient been seen at the hospital within the last three years: Yes Total Score: 80 Level Of Care: New/Established - Level 3 Electronic Signature(s) Signed: 04/26/2015 5:02:31 PM By: Curtis Sites Entered By: Curtis Sites on 04/26/2015 15:36:25 [name] ([id number]) -------------------------------------------------------------------------------- Encounter Discharge Information Details Patient Name: [name] Date of Service: 04/26/2015 3:00 PM Medical Record Number: [id number] Patient Account Number: [id number] Date of Birth/Sex: Feb 03, 1956 (59 y.o. Male) Treating RN: Curtis Sites Primary Care Physician: Other Clinician: Referring Physician: Bayard Males Treating Physician/Extender: BURNS III, Regis Bill in Treatment: 5 Encounter Discharge Information Items Schedule  Follow-up Appointment: No Medication Reconciliation completed and provided to Patient/Care No Jaydence Arnesen: Provided on Clinical Summary of Care: 04/26/2015 Form Type Recipient Paper Patient IAG Electronic Signature(s) Signed: 04/26/2015 3:37:58 PM By: Gwenlyn Perking Entered By: Gwenlyn Perking on 04/26/2015 15:37:58 [name] ([id number]) -------------------------------------------------------------------------------- Lower Extremity Assessment Details Patient Name: [name] Date of Service: 04/26/2015 3:00 PM Medical Record Number: [id number] Patient Account Number: [id number] Date of Birth/Sex: 11-08-1955 (59 y.o. Male) Treating RN: Curtis Sites Primary Care Physician: Other Clinician: Referring Physician: Bayard Males Treating Physician/Extender: BURNS III, WALTER Weeks in Treatment: 5 Edema Assessment Assessed: [Left: No] [Right: No] Edema: [Left: Ye] [Right: s] Vascular Assessment Pulses: Posterior Tibial Dorsalis Pedis Palpable: [Left:Yes] Extremity colors, hair growth, and conditions: Extremity Color: [Left:Hyperpigmented] Hair Growth on Extremity: [Left:Yes] Temperature of Extremity: [Left:Warm] Capillary Refill: [Left:< 3 seconds] Toe Nail Assessment Left: Right: Thick: Yes Discolored: Yes Deformed: Yes Improper Length and Hygiene: No Electronic Signature(s) Signed: 04/26/2015 5:02:31 PM By: Curtis Sites Entered By: Curtis Sites on 04/26/2015 15:06:16 [name] ([id number]) -------------------------------------------------------------------------------- Multi Wound Chart Details Patient Name: [name] Date of Service: 04/26/2015 3:00 PM Medical Record Number: [id number] Patient Account Number: [id number] Date of Birth/Sex: Aug 01, 1955 (59 y.o. Male) Treating RN: Curtis Sites Primary Care Physician: Other Clinician: Referring Physician: Bayard Males Treating Physician/Extender: BURNS III, WALTER Weeks  in Treatment: 5 Vital Signs Height(in): 76 Pulse(bpm): 75 Weight(lbs): 175 Blood Pressure 97/70 (mmHg): Body Mass Index(BMI): 21 Temperature(F): Respiratory Rate 18 (breaths/min): Photos: [1:No Photos] [N/A:N/A] Wound Location: [1:Left Malleolus - Medial] [N/A:N/A] Wounding Event: [1:Gradually Appeared] [N/A:N/A] Primary Etiology: [1:Venous Leg Ulcer] [N/A:N/A] Date Acquired: [1:02/14/2015] [N/A:N/A] Weeks of Treatment: [1:5] [N/A:N/A] Wound Status: [1:Open] [N/A:N/A] Measurements L x W x D 5.7x1.2x0.2 [N/A:N/A] (cm) Area (cm) : [1:5.372] [N/A:N/A] Volume (cm) : [1:1.074] [N/A:N/A] % Reduction in Area: [1:-279.90%] [N/A:N/A] % Reduction in Volume: -661.70% [N/A:N/A] Classification: [1:Partial Thickness] [N/A:N/A] Exudate Amount: [  1:Small] [N/A:N/A] Exudate Type: [1:Serous] [N/A:N/A] Exudate Color: [1:amber] [N/A:N/A] Wound Margin: [1:Indistinct, nonvisible] [N/A:N/A] Granulation Amount: [1:Medium (34-66%)] [N/A:N/A] Granulation Quality: [1:Pink] [N/A:N/A] Necrotic Amount: [1:Medium (34-66%)] [N/A:N/A] Necrotic Tissue: [1:Eschar, Adherent Slough] [N/A:N/A] Exposed Structures: [1:Fascia: No Fat: No Tendon: No Muscle: No Joint: No Bone: No Limited to Skin Breakdown] [N/A:N/A] Epithelialization: Small (1-33%) N/A N/A Periwound Skin Texture: Edema: No N/A N/A Excoriation: No Induration: No Callus: No Crepitus: No Fluctuance: No Friable: No Rash: No Scarring: No Periwound Skin Dry/Scaly: Yes N/A N/A Moisture: Maceration: No Moist: No Periwound Skin Color: Atrophie Blanche: No N/A N/A Cyanosis: No Ecchymosis: No Erythema: No Hemosiderin Staining: No Mottled: No Pallor: No Rubor: No Temperature: No Abnormality N/A N/A Tenderness on Yes N/A N/A Palpation: Wound Preparation: Ulcer Cleansing: N/A N/A Rinsed/Irrigated with Saline Topical Anesthetic Applied: Other: lidocaine 4% Treatment Notes Electronic Signature(s) Signed: 04/26/2015 5:02:31 PM By:  Curtis Sitesorthy, Joanna Entered By: Curtis Sitesorthy, Joanna on 04/26/2015 15:06:58 Cameron GUZMAN, [place] ([id number]) -------------------------------------------------------------------------------- Multi-Disciplinary Care Plan Details Patient Name: Cameron GUZMAN, [place] Date of Service: 04/26/2015 3:00 PM Medical Record Number: [id number] Patient Account Number: [id number] Date of Birth/Sex: 1955-08-20 56(58 y.o. Male) Treating RN: Curtis Sitesorthy, Joanna Primary Care Physician: Other Clinician: Referring Physician: Bayard MalesBROWN, [place] Treating Physician/Extender: BURNS III, Regis BillWALTER Weeks in Treatment: 5 Active Inactive Orientation to the Wound Care Program Nursing Diagnoses: Knowledge deficit related to the wound healing center program Goals: Patient/caregiver will verbalize understanding of the Wound Healing Center Program Date Initiated: 03/22/2015 Goal Status: Active Interventions: Provide education on orientation to the wound center Notes: Venous Leg Ulcer Nursing Diagnoses: Actual venous Insuffiency (use after diagnosis is confirmed) Goals: Patient will maintain optimal edema control Date Initiated: 03/22/2015 Goal Status: Active Interventions: Assess peripheral edema status every visit. Notes: Wound/Skin Impairment Nursing Diagnoses: Impaired tissue integrity Goals: Patient/caregiver will verbalize understanding of skin care regimen Date Initiated: 03/22/2015 Cameron PollackACUNA GUZMAN, [place] ([id number]) Goal Status: Active Ulcer/skin breakdown will heal within 14 weeks Date Initiated: 03/22/2015 Goal Status: Active Interventions: Assess ulceration(s) every visit Treatment Activities: Topical wound management initiated : 04/26/2015 Notes: Electronic Signature(s) Signed: 04/26/2015 5:02:31 PM By: Curtis Sitesorthy, Joanna Entered By: Curtis Sitesorthy, Joanna on 04/26/2015 15:06:49 Cameron GUZMAN, [place] ([id number]) -------------------------------------------------------------------------------- Patient/Caregiver Education  Details Patient Name: Cameron GUZMAN, [place] Date of Service: 04/26/2015 3:00 PM Medical Record Number: [id number] Patient Account Number: [id number] Date of Birth/Gender: 1955-08-20 [age] y.o. Male) Treating RN: Curtis Sitesorthy, Joanna Primary Care Physician: Other Clinician: Referring Physician: Bayard MalesBROWN, [place] Treating Physician/Extender: BURNS III, Regis BillWALTER Weeks in Treatment: 5 Education Assessment Education Provided To: Patient Education Topics Provided Wound/Skin Impairment: Handouts: Other: do not use regular honey - use medihoney that will be ordered for you Methods: Demonstration, Explain/Verbal Responses: State content correctly Electronic Signature(s) Signed: 04/26/2015 5:02:31 PM By: Curtis Sitesorthy, Joanna Entered By: Curtis Sitesorthy, Joanna on 04/26/2015 15:38:36 Cameron GUZMAN, [place] ([id number]) -------------------------------------------------------------------------------- Wound Assessment Details Patient Name: Cameron GUZMAN, [place] Date of Service: 04/26/2015 3:00 PM Medical Record Number: [id number] Patient Account Number: [id number] Date of Birth/Sex: 1955-08-20 31(58 y.o. Male) Treating RN: Curtis Sitesorthy, Joanna Primary Care Physician: Other Clinician: Referring Physician: Bayard MalesBROWN, [place] Treating Physician/Extender: BURNS III, WALTER Weeks in Treatment: 5 Wound Status Wound Number: 1 Primary Etiology: Venous Leg Ulcer Wound Location: Left Malleolus - Medial Wound Status: Open Wounding Event: Gradually Appeared Date Acquired: 02/14/2015 Weeks Of Treatment: 5 Clustered Wound: No Photos Photo Uploaded By: Curtis Sitesorthy, Joanna on 04/26/2015 16:18:28 Wound Measurements Length: (cm) 5.7 Width: (cm) 1.2 Depth: (cm) 0.2 Area: (cm) 5.372 Volume: (cm) 1.074 % Reduction in Area: -279.9% % Reduction in Volume: -661.7% Epithelialization:  Small (1-33%) Tunneling: No Undermining: No Wound Description Classification: Partial Thickness Wound Margin: Indistinct, nonvisible Exudate Amount:  Small Exudate Type: Serous Exudate Color: amber Wound Bed Granulation Amount: Medium (34-66%) Exposed Structure Granulation Quality: Pink Fascia Exposed: No Necrotic Amount: Medium (34-66%) Fat Layer Exposed: No Necrotic Quality: Eschar, Adherent Slough Tendon Exposed: No [name] ([id number]) Muscle Exposed: No Joint Exposed: No Bone Exposed: No Limited to Skin Breakdown Periwound Skin Texture Texture Color No Abnormalities Noted: No No Abnormalities Noted: No Callus: No Atrophie Blanche: No Crepitus: No Cyanosis: No Excoriation: No Ecchymosis: No Fluctuance: No Erythema: No Friable: No Hemosiderin Staining: No Induration: No Mottled: No Localized Edema: No Pallor: No Rash: No Rubor: No Scarring: No Temperature / Pain Moisture Temperature: No Abnormality No Abnormalities Noted: No Tenderness on Palpation: Yes Dry / Scaly: Yes Maceration: No Moist: No Wound Preparation Ulcer Cleansing: Rinsed/Irrigated with Saline Topical Anesthetic Applied: Other: lidocaine 4%, Treatment Notes Wound #1 (Left, Medial Malleolus) 1. Cleansed with: Clean wound with Normal Saline 4. Dressing Applied: Medihoney Gel 5. Secondary Dressing Applied Gauze and Kerlix/Conform 6. Footwear/Offloading device applied Tubigrip 7. Secured with Secretary/administrator) Signed: 04/26/2015 5:02:31 PM By: Curtis Sites Entered By: Curtis Sites on 04/26/2015 15:06:39 [name] ([id number]) -------------------------------------------------------------------------------- Vitals Details Patient Name: [name], [name] Date of Service: 04/26/2015 3:00 PM Medical Record Number: [id number] Patient Account Number: [id number] Date of Birth/Sex: Nov 17, 1955 (59 y.o. Male) Treating RN: Curtis Sites Primary Care Physician: Other Clinician: Referring Physician: Bayard Males Treating Physician/Extender: BURNS III, WALTER Weeks in Treatment: 5 Vital Signs Time  Taken: 15:04 Pulse (bpm): 75 Height (in): 76 Respiratory Rate (breaths/min): 18 Weight (lbs): 175 Blood Pressure (mmHg): 97/70 Body Mass Index (BMI): 21.3 Reference Range: 80 - 120 mg / dl Electronic Signature(s) Signed: 04/26/2015 5:02:31 PM By: Curtis Sites Entered By: Curtis Sites on 04/26/2015 15:04:34

## 2015-05-10 ENCOUNTER — Encounter: Payer: PRIVATE HEALTH INSURANCE | Admitting: Surgery

## 2015-05-10 DIAGNOSIS — L97322 Non-pressure chronic ulcer of left ankle with fat layer exposed: Secondary | ICD-10-CM | POA: Diagnosis not present

## 2015-05-11 NOTE — Progress Notes (Signed)
[name], [name] ([id number]) Visit Report for 05/10/2015 Chief Complaint Document Details [name], Date of Service: 05/10/2015 3:15 PM Patient Name: [name] Patient Account Number: [id number] Medical Record Treating RN: Cameron Mealy RN, Bell, [name] Number: Other Clinician: Date of Birth/Sex: May 23, 1956 (59 y.o. Male) Treating [name], Primary Care Physician: Physician/Extender: [name] Referring Physician: Bayard Bell Weeks in Treatment: 7 Information Obtained from: Patient Chief Complaint Chronic left calf/ankle ulcer. Electronic Signature(s) Signed: 05/10/2015 3:59:00 PM By: Cameron Bhat MD Entered By: [name] on 05/10/2015 15:37:16 [name], [name] ([id number]) -------------------------------------------------------------------------------- HPI Details [name], Date of Service: 05/10/2015 3:15 PM Patient Name: [name] Patient Account Number: [id number] Medical Record Treating RN: Cameron Mealy RN, Bell, [name] Number: Other Clinician: Date of Birth/Sex: 02-19-1956 (58 y.o. Male) Treating [name], Primary Care Physician: Physician/Extender: [name] Referring Physician: Bayard Bell Weeks in Treatment: 7 History of Present Illness HPI Description: Pleasant 59 year old with no significant past medical history. No history of diabetes or peripheral arterial disease. History obtained from interpreter. He says that he developed a spontaneous ulceration on his left medial ankle in July 2016. He thinks that it was secondary to a "ruptured vein". He denies any history of venous stasis ulcerations or of DVT. Ambulating per his baseline. 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 No claudication or rest pain. Underwent ultrasound on 04/18/2015 at [facility]. No DVT. No evidence for venous insufficiency. Foam sclerotherapy recommended. Performing dressing changes with Medihoney and using Tubigrip for edema control. Declined  compression bandages. He returns to clinic for follow-up and is without new complaints. No significant pain. No fever or chills. Minimal to no drainage. Declined debridement and compression bandage today. Electronic Signature(s) Signed: 05/10/2015 3:59:00 PM By: Cameron Bhat MD Entered By: [name] on 05/10/2015 15:38:26 [name], [name] ([id number]) -------------------------------------------------------------------------------- Physical Exam Details [name], Date of Service: 05/10/2015 3:15 PM Patient Name: [name] Patient Account Number: [id number] Medical Record Treating RN: Cameron Mealy RN, Bell, [name] Number: Other Clinician: Date of Birth/Sex: January 19, 1956 (58 y.o. Male) Treating [name], Primary Care Physician: Physician/Extender: [name] Referring Physician: Bayard Bell Weeks in Treatment: 7 Constitutional . Pulse regular. Respirations normal and unlabored. Afebrile. Marland Kitchen Respiratory WNL. No retractions.. Cardiovascular Pedal Pulses WNL. Integumentary (Hair, Skin) .Marland Kitchen Neurological Sensation normal to touch, pin,and vibration. Psychiatric Judgement and insight Intact.. Oriented times 3.. No evidence of depression, anxiety, or agitation.. Notes Left medial calf/ankle ulcerations much improved. Almost healed. No infection. Edema well controlled. Multiple varicosities. Palpable DP. Electronic Signature(s) Signed: 05/10/2015 3:59:00 PM By: Cameron Bhat MD Entered By: [name] on 05/10/2015 15:39:07 [name], [name] ([id number]) -------------------------------------------------------------------------------- Physician Orders Details [name], Date of Service: 05/10/2015 3:15 PM Patient Name: [name] Patient Account Number: [id number] Medical Record Treating RN: [name] [id number] Number: Other Clinician: Date of Birth/Sex: 1955/12/19 (59 y.o. Male) Treating [name], Primary Care Physician: Physician/Extender:  [name] Referring Physician: Manuela Bell in Treatment: 7 Verbal / Phone Orders: Yes Clinician: Huel Bell Read Back and Verified: Yes Diagnosis Coding Wound Cleansing Wound #1 Left,Medial Malleolus o Clean wound with Normal Saline. Anesthetic Wound #1 Left,Medial Malleolus o Topical Lidocaine 4% cream applied to wound bed prior to debridement Primary Wound Dressing Wound #1 Left,Medial Malleolus o Medihoney gel Secondary Dressing Wound #1 Left,Medial Malleolus o ABD and Kerlix/Conform Dressing Change Frequency Wound #1 Left,Medial Malleolus o Change dressing every other day. Edema Control Wound #1 Left,Medial Malleolus o Elevate legs to the level of the heart and pump ankles  as often as possible o Tubigrip Additional Orders / Instructions Wound #1 Left,Medial Malleolus o Activity as tolerated Discharge From [name] Wound #1 Left,Medial Malleolus o Discharge from [facility] - Transfer care to [facility] for foam procedure [name], [name] ([id number]) Electronic Signature(s) Signed: 05/10/2015 3:59:00 PM By: Cameron BhatBurns, Bell, Amoura Ransier MD Signed: 05/10/2015 5:15:26 PM By: Cameron GurneyWoody, RN, Bell, [name] Entered By: Cameron GurneyWoody, RN, Bell, Cameron on 05/10/2015 15:29:43 [name], [name] ([id number]) -------------------------------------------------------------------------------- Problem List Details [name], Date of Service: 05/10/2015 3:15 PM Patient Name: [name] Patient Account Number: [id number] Medical Record Treating RN: Cameron MealyAfful, RN, Bell, Kern Bell [id number] Number: Other Clinician: Date of Birth/Sex: 05/15/1956 (58 y.o. 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 03/22/2015 Yes exposed Inactive Problems Resolved Problems Electronic Signature(s) Signed: 05/10/2015 3:59:00 PM By: Cameron BhatBurns, Bell, Emaad Nanna MD Entered By: Cameron BhatBurns, Bell, Ronnetta Currington on 05/10/2015 15:37:06 [name], [name] ([id number]) -------------------------------------------------------------------------------- Progress Note Details [name], Date of Service: 05/10/2015 3:15 PM Patient Name: [name] Patient Account Number: [id number] Medical Record Treating RN: Cameron MealyAfful, RN, Bell,  Sinkita [id number] Number: Other Clinician: Date of Birth/Sex: 05/15/1956 [age] y.o. Male) Treating [name], Primary Care Physician: Physician/Extender: Cameron BeckersWALTER Referring Physician: Bayard MalesBROWN,  Weeks in Treatment: 7 Subjective Chief Complaint Information obtained from Patient Chronic left calf/ankle ulcer. History of Present Illness (HPI) Pleasant 59 year old with no significant past medical history. No history of diabetes or peripheral arterial disease. History obtained from interpreter. He says that he developed a spontaneous ulceration on his left medial ankle in July 2016. He thinks that it was secondary to a "ruptured vein". He denies any history of venous stasis ulcerations or of DVT. Ambulating per his baseline. 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 No claudication or rest pain. [facility] No DVT. No evidence for venous insufficiency. Foam sclerotherapy recommended. Performing dressing changes with Medihoney and using Tubigrip for edema control. Declined compression bandages. He returns to clinic for follow-up and is without new complaints. No significant pain. No fever or chills. Minimal to no drainage. Declined debridement and compression bandage today. Objective Constitutional Pulse regular. Respirations normal and unlabored. Afebrile. Vitals Time Taken: 3:15 PM, Height: 76 in, Weight:  175 lbs, BMI: 21.3, Temperature: 98.1 F, Pulse: 77 bpm, Respiratory Rate: 18 breaths/min, Blood Pressure: 106/63 mmHg. Respiratory WNL. No retractions.[name]. [name], [name] ([id number]) Cardiovascular Pedal Pulses WNL. Neurological Sensation normal to touch, pin,and vibration. Psychiatric Judgement and insight Intact.. Oriented times 3.. No evidence of depression, anxiety, or agitation.. General Notes: Left medial calf/ankle ulcerations much improved. Almost healed. No infection. Edema well controlled. Multiple varicosities. Palpable DP. Integumentary (Hair, Skin) Wound #1 status is Open. Original cause of wound was Gradually Appeared. The wound is located on the Left,Medial Malleolus. The wound measures 7.7cm length x 1cm width x 0.1cm depth; 6.048cm^2 area and 0.605cm^3 volume. The wound is limited to skin breakdown. There is a small amount of serous drainage noted. The wound margin is indistinct and nonvisible. There is no granulation within the wound bed. There is a large (67-100%) amount of necrotic tissue within the wound bed including Eschar. The periwound skin appearance exhibited: Dry/Scaly. The periwound skin appearance did not exhibit: Callus, Crepitus, Excoriation, Fluctuance, Friable, Induration, Localized Edema,  Rash, Scarring, Maceration, Moist, Atrophie Blanche, Cyanosis, Ecchymosis, Hemosiderin Staining, Mottled, Pallor, Rubor, Erythema. Periwound temperature was noted as No Abnormality. The periwound has tenderness on palpation. 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 Plan Wound Cleansing: Wound #1 Left,Medial Malleolus: [name], [name] ([id number]) Clean wound with Normal Saline. Anesthetic: Wound #1 Left,Medial Malleolus: Topical Lidocaine 4% cream applied  to wound bed prior to debridement Primary Wound Dressing: Wound #1 Left,Medial Malleolus: Medihoney gel Secondary Dressing: Wound #1 Left,Medial Malleolus: ABD and Kerlix/Conform Dressing Change Frequency: Wound #1 Left,Medial Malleolus: Change dressing every other day. Edema Control: Wound #1 Left,Medial Malleolus: Elevate legs to the level of the heart and pump ankles as often as possible Tubigrip Additional Orders / Instructions: Wound #1 Left,Medial Malleolus: Activity as tolerated Discharge From [facility] Bell: Wound #1 Left,Medial Malleolus: Discharge from [facility] - Transfer care to [facility] for foam procedure Continue with medihoney until healed. Edema control with Tubigrip or compression stockings (20-30 mmHg). The patient is planning to have his daughter call [facility] to schedule foam sclerotherapy. He wishes to return to the wound clinic on a when necessary basis. I encouraged him to call at any time with any questions or concerns. Electronic Signature(s) Signed: 05/10/2015 3:59:00 PM By: Cameron Bhat MD Entered By: [name] on 05/10/2015 15:40:36 [name], [name] ([id number]) -------------------------------------------------------------------------------- SuperBill Details [name], Date of Service: 05/10/2015 Patient Name: [name] Patient Account Number: [id number] Medical Record Treating RN: Cameron Mealy RN, Bell, Bairdford Bell [id number] Number: Other Clinician: Date of Birth/Sex: [birth year] (58 y.o. 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

## 2015-05-11 NOTE — Progress Notes (Signed)
[name], [name] ([id number]) Visit Report for 05/10/2015 Arrival Information Details Patient Name: [name], [name] Date of Service: 05/10/2015 3:15 PM Medical Record Number: [id number] Patient Account Number: [id number] Date of Birth/Sex: 1956-03-23 (59 y.o. Male) Treating RN: Huel Coventry Primary Care Physician: Other Clinician: Referring Physician: Bayard Males Treating Physician/Extender: BURNS III, Regis Bill in Treatment: 7 Visit Information History Since Last Visit Added or deleted any medications: No Patient Arrived: Ambulatory Any new allergies or adverse No Arrival Time: 15:13 reactions: Accompanied By: intrrepreter Had a fall or experienced change in No Transfer Assistance: None activities of daily living that may Patient Identification Verified: Yes affect Secondary Verification Process Yes risk of falls: Completed: Signs or symptoms of abuse/neglect No Patient Requires Transmission-Based No since last visito Precautions: Hospitalized since last visit: No Patient Has Alerts: No Has Dressing in Place as Yes Prescribed: Has Compression in Place as Yes Prescribed: Pain Present Now: Unable to Respond Electronic Signature(s) Signed: 05/10/2015 5:15:26 PM By: Elliot Gurney, RN, BSN, Kim RN, BSN Entered By: Elliot Gurney, RN, BSN, Kim on 05/10/2015 15:13:53 [name], [name] ([id number]) -------------------------------------------------------------------------------- Clinic Level of Care Assessment Details Patient Name: [name], [name] Date of Service: 05/10/2015 3:15 PM Medical Record Number: [id number] Patient Account Number: [id number] Date of Birth/Sex: 1955-10-12 (59 y.o. Male) Treating RN: Huel Coventry Primary Care Physician: Other Clinician: Referring Physician: Bayard Males Treating Physician/Extender: BURNS III, Regis Bill in Treatment: 7 Clinic Level of Care Assessment Items TOOL 4 Quantity Score  - Use when only an EandM is performed on  FOLLOW-UP visit 0 ASSESSMENTS - Nursing Assessment / Reassessment  - Reassessment of Co-morbidities (includes updates in patient status) 0 X - Reassessment of Adherence to Treatment Plan 1 5 ASSESSMENTS - Wound and Skin Assessment / Reassessment X - Simple Wound Assessment / Reassessment - one wound 1 5  - Complex Wound Assessment / Reassessment - multiple wounds 0  - Dermatologic / Skin Assessment (not related to wound area) 0 ASSESSMENTS - Focused Assessment  - Circumferential Edema Measurements - multi extremities 0  - Nutritional Assessment / Counseling / Intervention 0  - Lower Extremity Assessment (monofilament, tuning fork, pulses) 0  - Peripheral Arterial Disease Assessment (using hand held doppler) 0 ASSESSMENTS - Ostomy and/or Continence Assessment and Care  - Incontinence Assessment and Management 0  - Ostomy Care Assessment and Management (repouching, etc.) 0 PROCESS - Coordination of Care X - Simple Patient / Family Education for ongoing care 1 15  - Complex (extensive) Patient / Family Education for ongoing care 0  - Staff obtains Chiropractor, Records, Test Results / Process Orders 0  - Staff telephones HHA, Nursing Homes / Clarify orders / etc 0  - Routine Transfer to another Facility (non-emergent condition) 0 [name], [name] ([id number])  - Routine Hospital Admission (non-emergent condition) 0  - New Admissions / Manufacturing engineer / Ordering NPWT, Apligraf, etc. 0  - Emergency Hospital Admission (emergent condition) 0 X - Simple Discharge Coordination 1 10  - Complex (extensive) Discharge Coordination 0 PROCESS - Special Needs  - Pediatric / Minor Patient Management 0  - Isolation Patient Management 0  - Hearing / Language / Visual special needs 0  - Assessment of Community assistance (transportation, D/C planning, etc.) 0  - Additional assistance / Altered mentation 0  - Support Surface(s) Assessment (bed,  cushion, seat, etc.) 0 INTERVENTIONS - Wound Cleansing / Measurement X - Simple Wound Cleansing - one wound 1 5  - Complex Wound Cleansing - multiple wounds 0 X - Wound  Imaging (photographs - any number of wounds) 1 5  - Wound Tracing (instead of photographs) 0 X - Simple Wound Measurement - one wound 1 5  - Complex Wound Measurement - multiple wounds 0 INTERVENTIONS - Wound Dressings X - Small Wound Dressing one or multiple wounds 1 10  - Medium Wound Dressing one or multiple wounds 0  - Large Wound Dressing one or multiple wounds 0  - Application of Medications - topical 0  - Application of Medications - injection 0 INTERVENTIONS - Miscellaneous  - External ear exam 0 [name], [name] ([id number])  - Specimen Collection (cultures, biopsies, blood, body fluids, etc.) 0  - Specimen(s) / Culture(s) sent or taken to Lab for analysis 0  - Patient Transfer (multiple staff / Michiel Sites Lift / Similar devices) 0  - Simple Staple / Suture removal (25 or less) 0  - Complex Staple / Suture removal (26 or more) 0  - Hypo / Hyperglycemic Management (close monitor of Blood Glucose) 0  - Ankle / Brachial Index (ABI) - do not check if billed separately 0 X - Vital Signs 1 5 Has the patient been seen at the hospital within the last three years: Yes Total Score: 65 Level Of Care: New/Established - Level 2 Electronic Signature(s) Signed: 05/10/2015 5:15:26 PM By: Elliot Gurney, RN, BSN, Kim RN, BSN Entered By: Elliot Gurney, RN, BSN, Kim on 05/10/2015 15:30:32 [name], [name] ([id number]) -------------------------------------------------------------------------------- Encounter Discharge Information Details Patient Name: [name], [name] Date of Service: 05/10/2015 3:15 PM Medical Record Number: [id number] Patient Account Number: [id number] Date of Birth/Sex: 1956-06-28 (59 y.o. Male) Treating RN: Huel Coventry Primary Care Physician: Other Clinician: Referring Physician:  Bayard Males Treating Physician/Extender: BURNS III, Regis Bill in Treatment: 7 Encounter Discharge Information Items Discharge Pain Level: 0 Discharge Condition: Stable Ambulatory Status: Ambulatory Discharge Destination: Home Transportation: Private Auto Accompanied By: self Schedule Follow-up Appointment: Yes Medication Reconciliation completed and provided to Patient/Care Yes Mayline Dragon: Provided on Clinical Summary of Care: 05/10/2015 Form Type Recipient Paper Patient IAG Electronic Signature(s) Signed: 05/10/2015 3:35:11 PM By: Gwenlyn Perking Entered By: Gwenlyn Perking on 05/10/2015 15:35:11 [name], [name] ([id number]) -------------------------------------------------------------------------------- Lower Extremity Assessment Details Patient Name: [name], [name] Date of Service: 05/10/2015 3:15 PM Medical Record Number: [id number] Patient Account Number: [id number] Date of Birth/Sex: 04-22-1956 (59 y.o. Male) Treating RN: Huel Coventry Primary Care Physician: Other Clinician: Referring Physician: Bayard Males Treating Physician/Extender: BURNS III, WALTER Weeks in Treatment: 7 Vascular Assessment Pulses: Posterior Tibial Dorsalis Pedis Palpable: [Left:Yes] Extremity colors, hair growth, and conditions: Extremity Color: [Left:Normal] Hair Growth on Extremity: [Left:Yes] Temperature of Extremity: [Left:Warm] Capillary Refill: [Left:< 3 seconds] Toe Nail Assessment Left: Right: Thick: No Discolored: No Deformed: No Improper Length and Hygiene: No Electronic Signature(s) Signed: 05/10/2015 5:15:26 PM By: Elliot Gurney, RN, BSN, Kim RN, BSN Entered By: Elliot Gurney, RN, BSN, Kim on 05/10/2015 15:18:29 [name], [name] ([id number]) -------------------------------------------------------------------------------- Multi Wound Chart Details Patient Name: [name], [name] Date of Service: 05/10/2015 3:15 PM Medical Record Number: [id number] Patient Account  Number: [id number] Date of Birth/Sex: 1956/04/25 (59 y.o. Male) Treating RN: Huel Coventry Primary Care Physician: Other Clinician: Referring Physician: Bayard Males Treating Physician/Extender: BURNS III, WALTER Weeks in Treatment: 7 Vital Signs Height(in): 76 Pulse(bpm): 77 Weight(lbs): 175 Blood Pressure 106/63 (mmHg): Body Mass Index(BMI): 21 Temperature(F): 98.1 Respiratory Rate 18 (breaths/min): Photos: [1:No Photos] [N/A:N/A] Wound Location: [1:Left Malleolus - Medial] [N/A:N/A] Wounding Event: [1:Gradually Appeared] [N/A:N/A] Primary Etiology: [1:Venous Leg Ulcer] [N/A:N/A] Date Acquired: [1:02/14/2015] [N/A:N/A] Weeks of Treatment: [1:7] [N/A:N/A] Wound  Status: [1:Open] [N/A:N/A] Measurements L x W x D 7.7x1x0.1 [N/A:N/A] (cm) Area (cm) : [1:6.048] [N/A:N/A] Volume (cm) : [1:0.605] [N/A:N/A] % Reduction in Area: [1:-327.70%] [N/A:N/A] % Reduction in Volume: -329.10% [N/A:N/A] Classification: [1:Partial Thickness] [N/A:N/A] Exudate Amount: [1:Small] [N/A:N/A] Exudate Type: [1:Serous] [N/A:N/A] Exudate Color: [1:amber] [N/A:N/A] Wound Margin: [1:Indistinct, nonvisible] [N/A:N/A] Granulation Amount: [1:None Present (0%)] [N/A:N/A] Necrotic Amount: [1:Large (67-100%)] [N/A:N/A] Necrotic Tissue: [1:Eschar] [N/A:N/A] Exposed Structures: [1:Fascia: No Fat: No Tendon: No Muscle: No Joint: No Bone: No Limited to Skin Breakdown] [N/A:N/A] Epithelialization: [1:Small (1-33%)] [N/A:N/A] Periwound Skin Texture: Edema: No N/A N/A Excoriation: No Induration: No Callus: No Crepitus: No Fluctuance: No Friable: No Rash: No Scarring: No Periwound Skin Dry/Scaly: Yes N/A N/A Moisture: Maceration: No Moist: No Periwound Skin Color: Atrophie Blanche: No N/A N/A Cyanosis: No Ecchymosis: No Erythema: No Hemosiderin Staining: No Mottled: No Pallor: No Rubor: No Temperature: No Abnormality N/A N/A Tenderness on Yes N/A N/A Palpation: Wound Preparation: Ulcer  Cleansing: N/A N/A Rinsed/Irrigated with Saline Topical Anesthetic Applied: None Treatment Notes Electronic Signature(s) Signed: 05/10/2015 5:15:26 PM By: Elliot GurneyWoody, RN, BSN, Kim RN, BSN Entered By: Elliot GurneyWoody, RN, BSN, Kim on 05/10/2015 15:20:06 [name], [name] ([id number]) -------------------------------------------------------------------------------- Multi-Disciplinary Care Plan Details Patient Name: [name], [name] Date of Service: 05/10/2015 3:15 PM Medical Record Number: [id number] Patient Account Number: [id number] Date of Birth/Sex: [birth year](58 y.o. Male) Treating RN: Huel CoventryWoody, Kim Primary Care Physician: Other Clinician: Referring Physician: Bayard MalesBROWN, [place] Treating Physician/Extender: BURNS III, Regis BillWALTER Weeks in Treatment: 7 Active Inactive Electronic Signature(s) Signed: 05/10/2015 5:15:26 PM By: Elliot GurneyWoody, RN, BSN, Kim RN, BSN Entered By: Elliot GurneyWoody, RN, BSN, Kim on 05/10/2015 15:36:04 [name], [name] ([id number]) -------------------------------------------------------------------------------- Patient/Caregiver Education Details Patient Name: [name], [name] Date of Service: 05/10/2015 3:15 PM Medical Record Number: [id number] Patient Account Number: [id number] Date of Birth/Gender: [birth year](58 y.o. Male) Treating RN: Huel CoventryWoody, Kim Primary Care Physician: Other Clinician: Referring Physician: Bayard MalesBROWN, [place] Treating Physician/Extender: BURNS III, Regis BillWALTER Weeks in Treatment: 7 Education Assessment Education Provided To: Patient Education Topics Provided Wound/Skin Impairment: Handouts: Caring for Your Ulcer Methods: Demonstration Responses: State content correctly Electronic Signature(s) Signed: 05/10/2015 5:15:26 PM By: Elliot GurneyWoody, RN, BSN, Kim RN, BSN Entered By: Elliot GurneyWoody, RN, BSN, Kim on 05/10/2015 15:33:59 [name], [name] ([id number]) -------------------------------------------------------------------------------- Wound Assessment Details Patient Name:  [name], [name] Date of Service: 05/10/2015 3:15 PM Medical Record Number: [id number] Patient Account Number: [id number] Date of Birth/Sex: [birth year](58 y.o. Male) Treating RN: Huel CoventryWoody, Kim Primary Care Physician: Other Clinician: Referring Physician: Bayard MalesBROWN, [place] Treating Physician/Extender: BURNS III, WALTER Weeks in Treatment: 7 Wound Status Wound Number: 1 Primary Etiology: Venous Leg Ulcer Wound Location: Left Malleolus - Medial Wound Status: Open Wounding Event: Gradually Appeared Date Acquired: 02/14/2015 Weeks Of Treatment: 7 Clustered Wound: No Photos Photo Uploaded By: Elliot GurneyWoody, RN, BSN, Kim on 05/10/2015 17:00:09 Wound Measurements Length: (cm) 7.7 Width: (cm) 1 Depth: (cm) 0.1 Area: (cm) 6.048 Volume: (cm) 0.605 % Reduction in Area: -327.7% % Reduction in Volume: -329.1% Epithelialization: Small (1-33%) Wound Description Classification: Partial Thickness Wound Margin: Indistinct, nonvisible Exudate Amount: Small Exudate Type: Serous Exudate Color: amber Shaune PollackCUNA Bell, [name] ([id number]) Wound Bed Granulation Amount: None Present (0%) Exposed Structure Necrotic Amount: Large (67-100%) Fascia Exposed: No Necrotic Quality: Eschar Fat Layer Exposed: No Tendon Exposed: No Muscle Exposed: No Joint Exposed: No Bone Exposed: No Limited to Skin Breakdown Periwound Skin Texture Texture Color No Abnormalities Noted: No No Abnormalities Noted: No Callus: No Atrophie Blanche: No Crepitus: No Cyanosis: No Excoriation: No Ecchymosis: No Fluctuance: No  Erythema: No Friable: No Hemosiderin Staining: No Induration: No Mottled: No Localized Edema: No Pallor: No Rash: No Rubor: No Scarring: No Temperature / Pain Moisture Temperature: No Abnormality No Abnormalities Noted: No Tenderness on Palpation: Yes Dry / Scaly: Yes Maceration: No Moist: No Wound Preparation Ulcer Cleansing: Rinsed/Irrigated with Saline Topical Anesthetic Applied:  None Electronic Signature(s) Signed: 05/10/2015 5:15:26 PM By: Elliot Gurney, RN, BSN, Kim RN, BSN Entered By: Elliot Gurney, RN, BSN, Kim on 05/10/2015 15:19:48 [name], [name] ([id number]) -------------------------------------------------------------------------------- Vitals Details Patient Name: [name], [name] Date of Service: 05/10/2015 3:15 PM Medical Record Number: [id number] Patient Account Number: [id number] Date of Birth/Sex: 1956/05/17 (59 y.o. Male) Treating RN: Huel Coventry Primary Care Physician: Other Clinician: Referring Physician: Bayard Males Treating Physician/Extender: BURNS III, WALTER Weeks in Treatment: 7 Vital Signs Time Taken: 15:15 Temperature (F): 98.1 Height (in): 76 Pulse (bpm): 77 Weight (lbs): 175 Respiratory Rate (breaths/min): 18 Body Mass Index (BMI): 21.3 Blood Pressure (mmHg): 106/63 Reference Range: 80 - 120 mg / dl Electronic Signature(s) Signed: 05/10/2015 5:15:26 PM By: Elliot Gurney, RN, BSN, Kim RN, BSN Entered By: Elliot Gurney, RN, BSN, Kim on 05/10/2015 15:17:30

## 2016-02-14 ENCOUNTER — Encounter: Payer: PRIVATE HEALTH INSURANCE | Attending: Internal Medicine | Admitting: Internal Medicine

## 2016-02-14 DIAGNOSIS — L97221 Non-pressure chronic ulcer of left calf limited to breakdown of skin: Secondary | ICD-10-CM | POA: Insufficient documentation

## 2016-02-14 DIAGNOSIS — Z87891 Personal history of nicotine dependence: Secondary | ICD-10-CM | POA: Insufficient documentation

## 2016-02-14 DIAGNOSIS — G629 Polyneuropathy, unspecified: Secondary | ICD-10-CM | POA: Diagnosis not present

## 2016-02-14 DIAGNOSIS — I87332 Chronic venous hypertension (idiopathic) with ulcer and inflammation of left lower extremity: Secondary | ICD-10-CM | POA: Diagnosis not present

## 2016-02-15 NOTE — Progress Notes (Signed)
[name], [name] ([id number]) Visit Report for 02/14/2016 Allergy List Details [name], Date of Service: 02/14/2016 8:00 AM Patient Name: [name] Patient Account Number: [id number] Medical Record Treating RN: Curtis Sites [id number] Number: Other Clinician: Date of Birth/Sex: [birth year] (60 y.o. Male) Treating Leanord Hawking, MICHAEL Primary Care Physician/Extender: Elwanda Brooklyn, MIRIAM Physician: Referring Physician: Bayard Males Weeks in Treatment: 0 Allergies Active Allergies No Known Drug Allergies Allergy Notes Electronic Signature(s) Signed: 02/14/2016 4:23:15 PM By: Curtis Sites Entered By: Curtis Sites on 02/14/2016 08:19:48 [name], [name] ([id number]) -------------------------------------------------------------------------------- Arrival Information Details [name], Date of Service: 02/14/2016 8:00 AM Patient Name: [name] Patient Account Number: [id number] Medical Record Treating RN: Curtis Sites [id number] Number: Other Clinician: Date of Birth/Sex: 1955-08-16 (60 y.o. Male) Treating ROBSON, MICHAEL Primary Care Physician/Extender: Elwanda [place], MIRIAM Physician: Referring Physician: Manuela Schwartz in Treatment: 0 Visit Information Patient Arrived: Ambulatory Arrival Time: 08:16 Accompanied By: dtr and translator Transfer Assistance: None Patient Identification Verified: Yes Secondary Verification Process Yes Completed: Patient Has Alerts: Yes Patient Alerts: ABI L 1.63 History Since Last Visit Added or deleted any medications: No Any new allergies or adverse reactions: No Had a fall or experienced change in activities of daily living that may affect risk of falls: No Signs or symptoms of abuse/neglect since last visito No Hospitalized since last visit: No Electronic Signature(s) Signed: 02/14/2016 4:23:15 PM By: Curtis Sites Entered By: Curtis Sites on 02/14/2016 08:31:18 [name], [name]  ([id number]) -------------------------------------------------------------------------------- Clinic Level of Care Assessment Details [name], Date of Service: 02/14/2016 8:00 AM Patient Name: [name] Patient Account Number: [id number] Medical Record Treating RN: Curtis Sites [id number] Number: Other Clinician: Date of Birth/Sex: 1956-06-19 (60 y.o. Male) Treating Leanord Hawking, MICHAEL Primary Care Physician/Extender: Elwanda [place], MIRIAM Physician: Referring Physician: Bayard Males Weeks in Treatment: 0 Clinic Level of Care Assessment Items TOOL 1 Quantity Score []  - Use when EandM and Procedure is performed on INITIAL visit 0 ASSESSMENTS - Nursing Assessment / Reassessment X - General Physical Exam (combine w/ comprehensive assessment (listed just 1 20 below) when performed on new pt. evals) X - Comprehensive Assessment (HX, ROS, Risk Assessments, Wounds Hx, etc.) 1 25 ASSESSMENTS - Wound and Skin Assessment / Reassessment []  - Dermatologic / Skin Assessment (not related to wound area) 0 ASSESSMENTS - Ostomy and/or Continence Assessment and Care []  - Incontinence Assessment and Management 0 []  - Ostomy Care Assessment and Management (repouching, etc.) 0 PROCESS - Coordination of Care X - Simple Patient / Family Education for ongoing care 1 15 []  - Complex (extensive) Patient / Family Education for ongoing care 0 X - Staff obtains Chiropractor, Records, Test Results / Process Orders 1 10 []  - Staff telephones HHA, Nursing Homes / Clarify orders / etc 0 []  - Routine Transfer to another Facility (non-emergent condition) 0 []  - Routine Hospital Admission (non-emergent condition) 0 X - New Admissions / Manufacturing engineer / Ordering NPWT, Apligraf, etc. 1 15 []  - Emergency Hospital Admission (emergent condition) 0 PROCESS - Special Needs []  - Pediatric / Minor Patient Management 0 [name], [name] ([id number]) []  - Isolation Patient Management 0 []  - Hearing / Language /  Visual special needs 0 []  - Assessment of Community assistance (transportation, D/C planning, etc.) 0 []  - Additional assistance / Altered mentation 0 []  - Support Surface(s) Assessment (bed, cushion, seat, etc.) 0 INTERVENTIONS - Miscellaneous []  - External ear exam 0 []  - Patient Transfer (multiple staff / Nurse, adult / Similar devices) 0 []  - Simple Staple / Suture removal (25 or  less) 0 []  - Complex Staple / Suture removal (26 or more) 0 []  - Hypo/Hyperglycemic Management (do not check if billed separately) 0 []  - Ankle / Brachial Index (ABI) - do not check if billed separately 0 Has the patient been seen at the hospital within the last three years: Yes Total Score: 85 Level Of Care: New/Established - Level 3 Electronic Signature(s) Signed: 02/14/2016 4:23:15 PM By: Curtis Sites Entered By: Curtis Sites on 02/14/2016 08:54:31 [name], [name] ([id number]) -------------------------------------------------------------------------------- Encounter Discharge Information Details [name], Date of Service: 02/14/2016 8:00 AM Patient Name: [name] Patient Account Number: [id number] Medical Record Treating RN: Curtis Sites [id number] Number: Other Clinician: Date of Birth/Sex: [birth year] (60 y.o. Male) Treating ROBSON, MICHAEL Primary Care Physician/Extender: Elwanda [place], MIRIAM Physician: Referring Physician: Bayard Males Weeks in Treatment: 0 Encounter Discharge Information Items Discharge Pain Level: 0 Discharge Condition: Stable Ambulatory Status: Ambulatory Discharge Destination: Home Transportation: Private Auto dtr and Accompanied By: translator Schedule Follow-up Appointment: Yes Medication Reconciliation completed and provided to Patient/Care No Loye Reininger: Provided on Clinical Summary of Care: 02/14/2016 Form Type Recipient Paper Patient IAG Electronic Signature(s) Signed: 02/14/2016 9:15:52 AM By: Gwenlyn Perking Entered By: Gwenlyn Perking on 02/14/2016  09:15:51 [name], [name] ([id number]) -------------------------------------------------------------------------------- Lower Extremity Assessment Details [name], Date of Service: 02/14/2016 8:00 AM Patient Name: [name] Patient Account Number: [id number] Medical Record Treating RN: Curtis Sites [id number] Number: Other Clinician: Date of Birth/Sex: Jan 20, 1956 (60 y.o. Male) Treating ROBSON, MICHAEL Primary Care Physician/Extender: Elwanda [place], MIRIAM Physician: Referring Physician: Bayard Males Weeks in Treatment: 0 Edema Assessment Assessed: [Left: No] [Right: No] Edema: [Left: N] [Right: o] Calf Left: Right: Point of Measurement: 35 cm From Medial Instep 35.3 cm cm Ankle Left: Right: Point of Measurement: 11 cm From Medial Instep 23.6 cm cm Vascular Assessment Pulses: Posterior Tibial Palpable: [Left:Yes] Doppler: [Left:Monophasic] Dorsalis Pedis Palpable: [Left:Yes] Doppler: [Left:Monophasic] Extremity colors, hair growth, and conditions: Extremity Color: [Left:Hyperpigmented] Hair Growth on Extremity: [Left:Yes] Temperature of Extremity: [Left:Warm] Capillary Refill: [Left:< 3 seconds] Toe Nail Assessment Left: Right: Thick: Yes Discolored: Yes Deformed: Yes Improper Length and Hygiene: No [name], [name] ([id number]) Electronic Signature(s) Signed: 02/14/2016 4:23:15 PM By: Curtis Sites Entered By: Curtis Sites on 02/14/2016 08:38:59 [name], [name] ([id number]) -------------------------------------------------------------------------------- Multi Wound Chart Details [name], Date of Service: 02/14/2016 8:00 AM Patient Name: [name] Patient Account Number: [id number] Medical Record Treating RN: Curtis Sites [id number] Number: Other Clinician: Date of Birth/Sex: 1955/11/10 (60 y.o. Male) Treating ROBSON, MICHAEL Primary Care Physician/Extender: Elwanda [place], MIRIAM Physician: Referring Physician: Bayard Males Weeks in  Treatment: 0 Vital Signs Height(in): 71 Pulse(bpm): 69 Weight(lbs): 171 Blood Pressure 102/70 (mmHg): Body Mass Index(BMI): 24 Temperature(F): 97.5 Respiratory Rate 18 (breaths/min): Photos: [N/A:N/A] Wound Location: Left Lower Leg - Medial N/A N/A Wounding Event: Gradually Appeared N/A N/A Primary Etiology: Venous Leg Ulcer N/A N/A Comorbid History: Neuropathy N/A N/A Date Acquired: 12/18/2015 N/A N/A Weeks of Treatment: 0 N/A N/A Wound Status: Open N/A N/A Measurements L x W x D 2.6x1.7x0.2 N/A N/A (cm) Area (cm) : 3.471 N/A N/A Volume (cm) : 0.694 N/A N/A Classification: Full Thickness Without N/A N/A Exposed Support Structures Exudate Amount: Medium N/A N/A Exudate Type: Serous N/A N/A Exudate Color: amber N/A N/A Wound Margin: Flat and Intact N/A N/A Granulation Amount: None Present (0%) N/A N/A Necrotic Amount: Large (67-100%) N/A N/A [name], [name] ([id number]) Necrotic Tissue: Eschar, Adherent Slough N/A N/A Exposed Structures: Fascia: No N/A N/A Fat: No Tendon: No Muscle: No Joint: No Bone: No Limited  to Skin Breakdown Epithelialization: None N/A N/A Periwound Skin Texture: Edema: No N/A N/A Excoriation: No Induration: No Callus: No Crepitus: No Fluctuance: No Friable: No Rash: No Scarring: No Periwound Skin Maceration: No N/A N/A Moisture: Moist: No Dry/Scaly: No Periwound Skin Color: Erythema: Yes N/A N/A Atrophie Blanche: No Cyanosis: No Ecchymosis: No Hemosiderin Staining: No Mottled: No Pallor: No Rubor: No Erythema Location: Circumferential N/A N/A Temperature: No Abnormality N/A N/A Tenderness on Yes N/A N/A Palpation: Wound Preparation: Ulcer Cleansing: N/A N/A Rinsed/Irrigated with Saline Topical Anesthetic Applied: Other: lidocaine 4% Treatment Notes Electronic Signature(s) Signed: 02/14/2016 4:23:15 PM By: Curtis Sites Entered By: Curtis Sites on 02/14/2016 08:43:48 [name], [name]  ([id number]) -------------------------------------------------------------------------------- Multi-Disciplinary Care Plan Details [name], Date of Service: 02/14/2016 8:00 AM Patient Name: [name] Patient Account Number: [id number] Medical Record Treating RN: Curtis Sites [id number] Number: Other Clinician: Date of Birth/Sex: Oct 12, 1955 (60 y.o. Male) Treating ROBSON, MICHAEL Primary Care Physician/Extender: Elwanda [place], MIRIAM Physician: Referring Physician: Bayard Males Weeks in Treatment: 0 Active Inactive Venous Leg Ulcer Nursing Diagnoses: Actual venous Insuffiency (use after diagnosis is confirmed) Goals: Patient/caregiver will verbalize understanding of disease process and disease management Date Initiated: 02/14/2016 Goal Status: Active Interventions: Provide education on venous insufficiency Notes: Wound/Skin Impairment Nursing Diagnoses: Impaired tissue integrity Goals: Patient/caregiver will verbalize understanding of skin care regimen Date Initiated: 02/14/2016 Goal Status: Active Ulcer/skin breakdown will have a volume reduction of 30% by week 4 Date Initiated: 02/14/2016 Goal Status: Active Ulcer/skin breakdown will have a volume reduction of 50% by week 8 Date Initiated: 02/14/2016 Goal Status: Active Ulcer/skin breakdown will have a volume reduction of 80% by week 12 Date Initiated: 02/14/2016 Goal Status: Active Ulcer/skin breakdown will heal within 14 weeks [name], [name] ([id number]) Date Initiated: 02/14/2016 Goal Status: Active Interventions: Assess patient/caregiver ability to obtain necessary supplies Assess patient/caregiver ability to perform ulcer/skin care regimen upon admission and as needed Assess ulceration(s) every visit Notes: Electronic Signature(s) Signed: 02/14/2016 4:23:15 PM By: Curtis Sites Entered By: Curtis Sites on 02/14/2016 08:43:23 [name], [name]  ([id number]) -------------------------------------------------------------------------------- Pain Assessment Details [name], Date of Service: 02/14/2016 8:00 AM Patient Name: [name] Patient Account Number: [id number] Medical Record Treating RN: Curtis Sites [id number] Number: Other Clinician: Date of Birth/Sex: 1955/12/18 (60 y.o. Male) Treating ROBSON, MICHAEL Primary Care Physician/Extender: Elwanda [place], MIRIAM Physician: Referring Physician: Bayard Males Weeks in Treatment: 0 Active Problems Location of Pain Severity and Description of Pain Patient Has Paino Yes Site Locations Pain Location: Pain in Ulcers With Dressing Change: Yes Pain Management and Medication Current Pain Management: Notes Topical or injectable lidocaine is offered to patient for acute pain when surgical debridement is performed. If needed, Patient is instructed to use over the counter pain medication for the following 24-48 hours after debridement. Wound care MDs do not prescribed pain medications. Patient has chronic pain or uncontrolled pain. Patient has been instructed to make an appointment with their Primary Care Physician for pain management. Electronic Signature(s) Signed: 02/14/2016 4:23:15 PM By: Curtis Sites Entered By: Curtis Sites on 02/14/2016 08:19:16 [name], [name] ([id number]) -------------------------------------------------------------------------------- Patient/Caregiver Education Details [name], Date of Service: 02/14/2016 8:00 AM Patient Name: [name] Patient Account Number: [id number] Medical Record Treating RN: Curtis Sites [id number] Number: Other Clinician: Date of Birth/Gender: July 11, 1956 (60 y.o. Male) Treating ROBSON, MICHAEL Primary Care Physician/Extender: Elwanda [place], MIRIAM Physician: Tania Ade in Treatment: 0 Referring Physician: Bayard Males Education Assessment Education Provided To: Patient and Caregiver Education Topics  Provided Venous: Handouts: Other: DO NOT REMOVE WRAP Methods: Explain/Verbal  Responses: State content correctly Electronic Signature(s) Signed: 02/14/2016 4:23:15 PM By: Curtis Sites Entered By: Curtis Sites on 02/14/2016 09:39:40 [name], [name] ([id number]) -------------------------------------------------------------------------------- Wound Assessment Details [name], Date of Service: 02/14/2016 8:00 AM Patient Name: [name] Patient Account Number: [id number] Medical Record Treating RN: Curtis Sites [id number] Number: Other Clinician: Date of Birth/Sex: 27-Aug-1955 (60 y.o. Male) Treating ROBSON, MICHAEL Primary Care Physician/Extender: Elwanda [place], MIRIAM Physician: Referring Physician: Bayard Males Weeks in Treatment: 0 Wound Status Wound Number: 2 Primary Etiology: Venous Leg Ulcer Wound Location: Left Lower Leg - Medial Wound Status: Open Wounding Event: Gradually Appeared Comorbid History: Neuropathy Date Acquired: 12/18/2015 Weeks Of Treatment: 0 Clustered Wound: No Photos Wound Measurements Length: (cm) 2.6 Width: (cm) 1.7 Depth: (cm) 0.2 Area: (cm) 3.471 Volume: (cm) 0.694 % Reduction in Area: % Reduction in Volume: Epithelialization: None Tunneling: No Undermining: No Wound Description Full Thickness Without Exposed Foul Odor After Classification: Support Structures Wound Margin: Flat and Intact Exudate Medium Amount: Exudate Type: Serous Exudate Color: amber Cleansing: No Wound Bed Granulation Amount: None Present (0%) Exposed Structure [name], [name] ([id number]) Necrotic Amount: Large (67-100%) Fascia Exposed: No Necrotic Quality: Eschar, Adherent Slough Fat Layer Exposed: No Tendon Exposed: No Muscle Exposed: No Joint Exposed: No Bone Exposed: No Limited to Skin Breakdown Periwound Skin Texture Texture Color No Abnormalities Noted: No No Abnormalities Noted: No Callus: No Atrophie Blanche: No Crepitus:  No Cyanosis: No Excoriation: No Ecchymosis: No Fluctuance: No Erythema: Yes Friable: No Erythema Location: Circumferential Induration: No Hemosiderin Staining: No Localized Edema: No Mottled: No Rash: No Pallor: No Scarring: No Rubor: No Moisture Temperature / Pain No Abnormalities Noted: No Temperature: No Abnormality Dry / Scaly: No Tenderness on Palpation: Yes Maceration: No Moist: No Wound Preparation Ulcer Cleansing: Rinsed/Irrigated with Saline Topical Anesthetic Applied: Other: lidocaine 4%, Treatment Notes Wound #2 (Left, Medial Lower Leg) 1. Cleansed with: Clean wound with Normal Saline 2. Anesthetic Topical Lidocaine 4% cream to wound bed prior to debridement 4. Dressing Applied: Santyl Ointment 5. Secondary Dressing Applied ABD Pad 7. Secured with Tape 3 Layer Compression System - Left Lower Extremity Notes TCA to periwound [name], [name] ([id number]) Electronic Signature(s) Signed: 02/14/2016 4:23:15 PM By: Curtis Sites Entered By: Curtis Sites on 02/14/2016 08:33:58 [name], [name] ([id number]) -------------------------------------------------------------------------------- Vitals Details [name], Date of Service: 02/14/2016 8:00 AM Patient Name: [name] Patient Account Number: [id number] Medical Record Treating RN: Curtis Sites [id number] Number: Other Clinician: Date of Birth/Sex: Jun 07, 1956 (60 y.o. Male) Treating ROBSON, MICHAEL Primary Care Physician/Extender: Elwanda [place], MIRIAM Physician: Referring Physician: Bayard Males Weeks in Treatment: 0 Vital Signs Time Taken: 08:19 Temperature (F): 97.5 Height (in): 71 Pulse (bpm): 69 Source: Stated Respiratory Rate (breaths/min): 18 Weight (lbs): 171 Blood Pressure (mmHg): 102/70 Source: Measured Reference Range: 80 - 120 mg / dl Body Mass Index (BMI): 23.8 Electronic Signature(s) Signed: 02/14/2016 4:23:15 PM By: Curtis Sites Entered By: Curtis Sites on  02/14/2016 08:19:42

## 2016-02-15 NOTE — Progress Notes (Signed)
[name], [name] ([id number]) Visit Report for 02/14/2016 Abuse/Suicide Risk Screen Details [name], Date of Service: 02/14/2016 8:00 AM Patient Name: [name] Patient Account Number: [id number] Medical Record Treating RN: Curtis Sites [id number] Number: Other Clinician: Date of Birth/Sex: 02-12-1956 (59 y.o. Male) Treating ROBSON, MICHAEL Primary Care [place], [place] Physician/Extender: G Physician: Referring Physician: Bayard Males Weeks in Treatment: 0 Abuse/Suicide Risk Screen Items Answer ABUSE/SUICIDE RISK SCREEN: Has anyone close to you tried to hurt or harm you recentlyo No Do you feel uncomfortable with anyone in your familyo No Has anyone forced you do things that you didnot want to doo No Do you have any thoughts of harming yourselfo No Patient displays signs or symptoms of abuse and/or neglect. No Electronic Signature(s) Signed: 02/14/2016 4:23:15 PM By: Curtis Sites Entered By: Curtis Sites on 02/14/2016 08:23:12 [name], [name] ([id number]) -------------------------------------------------------------------------------- Activities of Daily Living Details [name], Date of Service: 02/14/2016 8:00 AM Patient Name: [name] Patient Account Number: [id number] Medical Record Treating RN: Curtis Sites [id number] Number: Other Clinician: Date of Birth/Sex: [birth year] (59 y.o. Male) Treating ROBSON, MICHAEL Primary Care [facility], [place] Physician/Extender: G Physician: Referring Physician: Bayard Males Weeks in Treatment: 0 Activities of Daily Living Items Answer Activities of Daily Living (Please select one for each item) Drive Automobile Completely Able Take Medications Completely Able Use Telephone Completely Able Care for Appearance Completely Able Use Toilet Completely Able Bath / Shower Completely Able Dress Self Completely Able Feed Self Completely Able Walk Completely Able Get In / Out Bed Completely Able Housework Completely  Able Prepare Meals Completely Able Handle Money Completely Able Shop for Self Completely Able Electronic Signature(s) Signed: 02/14/2016 4:23:15 PM By: Curtis Sites Entered By: Curtis Sites on 02/14/2016 08:23:26 [name], [name] ([id number]) -------------------------------------------------------------------------------- Education Assessment Details [name], Date of Service: 02/14/2016 8:00 AM Patient Name: [name] Patient Account Number: [id number] Medical Record Treating RN: Curtis Sites [id number] Number: Other Clinician: Date of Birth/Sex: [birth year] (60 y.o. Male) Treating ROBSON, MICHAEL Primary Care 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 Memory Deficit: No Emotional Barrier: No Cultural/Religious Beliefs Affecting Medical No Care: Physical Barrier Assessment Impaired Vision: No Impaired Hearing: No Decreased Hand dexterity: No Knowledge/Comprehension Assessment Knowledge Level: Medium Comprehension Level: Medium Ability to understand written Medium instructions: Ability to understand verbal Medium instructions: Motivation Assessment Anxiety Level: Calm Cooperation: Cooperative Education Importance: Acknowledges Need Interest in Health Problems: Asks Questions Perception: Coherent Willingness to Engage in Self- Medium Management Activities: Medium [name], [name] ([id number]) Readiness to Engage in Self- Management Activities: Electronic Signature(s) Signed: 02/14/2016 4:23:15 PM By: Curtis Sites Entered By: Curtis Sites on 02/14/2016  08:23:56 [name], [name] ([id number]) -------------------------------------------------------------------------------- Fall Risk Assessment Details [name], Date of Service: 02/14/2016 8:00 AM Patient Name: [name] Patient Account Number: [id number] Medical Record Treating RN: Curtis Sites [id number] Number: Other Clinician: Date of Birth/Sex: 1956-01-18 (59 y.o. Male) Treating ROBSON, MICHAEL Primary Care [place], [place] Physician/Extender: G Physician: Referring Physician: Bayard Males Weeks in Treatment: 0 Fall Risk Assessment Items Have you had 2 or more falls in the last 12 monthso 0 No Have you had any fall that resulted in injury in the last 12 monthso 0 No FALL RISK ASSESSMENT: History of falling - immediate or within 3 months 0 No Secondary diagnosis 0 No Ambulatory aid None/bed  rest/wheelchair/nurse 0 Yes Crutches/cane/walker 0 No Furniture 0 No IV Access/Saline Lock 0 No Gait/Training Normal/bed rest/immobile 0 Yes Weak 0 No Impaired 0 No Mental Status Oriented to own ability 0 Yes Electronic Signature(s) Signed: 02/14/2016 4:23:15 PM By: Curtis Sites Entered By: Curtis Sites on 02/14/2016 08:24:14 [name], [name] ([id number]) -------------------------------------------------------------------------------- Foot Assessment Details [name], Date of Service: 02/14/2016 8:00 AM Patient Name: [name] Patient Account Number: [id number] Medical Record Treating RN: Curtis Sites [id number] Number: Other Clinician: Date of Birth/Sex: 1956/03/29 (59 y.o. Male) Treating ROBSON, MICHAEL Primary Care [place], [place]IRIAM Physician/Extender: G Physician: Referring Physician: Bayard Males Weeks in Treatment: 0 Foot Assessment Items Site Locations + = Sensation present, - = Sensation absent, C = Callus, U = Ulcer R = Redness, W = Warmth, M = Maceration, PU = Pre-ulcerative lesion F = Fissure, S = Swelling, D = Dryness Assessment Right:  Left: Other Deformity: No No Prior Foot Ulcer: No No Prior Amputation: No No Charcot Joint: No No Ambulatory Status: Ambulatory Without Help Gait: Steady Electronic Signature(s) Signed: 02/14/2016 4:23:15 PM By: Curtis Sites Entered By: Curtis Sites on 02/14/2016 08:42:21 [name], [name] ([id number]) [name], [name] ([id number]) -------------------------------------------------------------------------------- Nutrition Risk Assessment Details [name], Date of Service: 02/14/2016 8:00 AM Patient Name: [name] Patient Account Number: [id number] Medical Record Treating RN: Curtis Sites [id number] Number: Other Clinician: Date of Birth/Sex: July 16, 1955 (60 y.o. Male) Treating ROBSON, MICHAEL Primary Care [place], [place]IRIAM Physician/Extender: G Physician: Referring Physician: Bayard Males Weeks in Treatment: 0 Height (in): 71 Weight (lbs): 171 Body Mass Index (BMI): 23.8 Nutrition Risk Assessment Items NUTRITION RISK SCREEN: I have an illness or condition that made me change the kind and/or 0 No amount of food I eat I eat fewer than two meals per day 0 No I eat few fruits and vegetables, or milk products 0 No I have three or more drinks of beer, liquor or wine almost every day 0 No I have tooth or mouth problems that make it hard for me to eat 0 No I don't always have enough money to buy the food I need 0 No I eat alone most of the time 0 No I take three or more different prescribed or over-the-counter drugs a 0 No day Without wanting to, I have lost or gained 10 pounds in the last six 0 No months I am not always physically able to shop, cook and/or feed myself 0 No Nutrition Protocols Good Risk Protocol 0 No interventions needed Moderate Risk Protocol Electronic Signature(s) Signed: 02/14/2016 4:23:15 PM By: Curtis Sites Entered By: Curtis Sites on 02/14/2016 08:24:20

## 2016-02-15 NOTE — Progress Notes (Signed)
[name], [name] ([id number]) Visit Report for 02/14/2016 Chief Complaint Document Details [name], Date of Service: 02/14/2016 8:00 AM Patient Name: [name] Patient Account Number: [id number] Medical Record Treating RN: Curtis Sites [id number] Number: Other Clinician: Date of Birth/Sex: Oct 16, 1955 (60 y.o. Male) Treating Eily Louvier Primary Care [facility], [place] Physician/Extender: G Physician: Referring Physician: Bayard Males Weeks in Treatment: 0 Information Obtained from: Patient Chief Complaint Chronic left calf/ankle ulcer. 02/14/16 patient is back in clinic with a wound in the same area of his left medial ankle Electronic Signature(s) Signed: 02/14/2016 4:24:05 PM By: Baltazar Najjar MD Entered By: Baltazar Najjar on 02/14/2016 08:58:29 [name], [name] ([id number]) -------------------------------------------------------------------------------- Debridement Details [name], Date of Service: 02/14/2016 8:00 AM Patient Name: [name] Patient Account Number: [id number] Medical Record Treating RN: Curtis Sites [id number] Number: Other Clinician: Date of Birth/Sex: Aug 31, 1955 (59 y.o. Male) Treating Nyriah Coote Primary Care [place], [place] Physician/Extender: G Physician: Referring Physician: Bayard Males Weeks in Treatment: 0 Debridement Performed for Wound #2 Left,Medial Lower Leg Assessment: Performed By: Physician Maxwell Caul, MD Debridement: Debridement Pre-procedure Yes Verification/Time Out Taken: Start Time: 08:48 Pain Control: Lidocaine 4% Topical Solution Level: Skin/Subcutaneous Tissue Total Area Debrided (L x 2.6 (cm) x 1.7 (cm) = 4.42 (cm) W): Tissue and other Viable, Non-Viable, Eschar, Fibrin/Slough, Subcutaneous material debrided: Instrument: Curette Bleeding: Minimum Hemostasis Achieved: Pressure End Time: 08:51 Procedural Pain: 0 Post Procedural Pain: 0 Response to Treatment: Procedure was tolerated  well Post Debridement Measurements of Total Wound Length: (cm) 2.6 Width: (cm) 1.7 Depth: (cm) 0.3 Volume: (cm) 1.041 Post Procedure Diagnosis Same as Pre-procedure Electronic Signature(s) Signed: 02/14/2016 4:23:15 PM By: Curtis Sites Signed: 02/14/2016 4:24:05 PM By: Baltazar Najjar MD Entered By: Baltazar Najjar on 02/14/2016 08:57:55 [name], [name] ([id number]) [name], [name] ([id number]) -------------------------------------------------------------------------------- HPI Details [name], Date of Service: 02/14/2016 8:00 AM Patient Name: [name] Patient Account Number: [id number] Medical Record Treating RN: Curtis Sites [id number] Number: Other Clinician: Date of Birth/Sex: 22-May-1956 (59 y.o. Male) Treating Chele Cornell Primary Care [facility], [place] Physician/Extender: G Physician: Referring Physician: Bayard Males Weeks in Treatment: 0 History of Present Illness HPI Description: Pleasant 60 year old with no significant past medical history. No history of diabetes or peripheral arterial disease. History obtained from interpreter. He says that he developed a spontaneous ulceration on his left medial ankle in July 2016. He thinks that it was secondary to a "ruptured vein". He denies any history of venous stasis ulcerations or of DVT. Ambulating per his baseline. 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 No claudication or rest pain. Underwent ultrasound on 04/18/2015 at [place] [facility]. No DVT. No evidence for venous insufficiency. Foam sclerotherapy recommended. Performing dressing changes with Medihoney and using Tubigrip for edema control. Declined compression bandages. He returns to clinic for follow-up and is without new complaints. No significant pain. No fever or chills. Minimal to no drainage. Declined debridement and compression bandage today. 02/14/16; this is a 60 year old Hispanic man who speaks minimal [place]. He is accompanied  today by his daughter who speaks [place] as well as a Nurse, learning disability. 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 He was here last year in the fall cared for by Dr. Lawerance Bach., I was not involved his with his care and as far as I know none of our current physician staff was. In any case at that time he was apparently noncompliant with compression. He would not allow debridement. He did go to see vein and vascular who recommended that ablation of  his varicosities however he apparently did not go through this. According to his daughter the wound actually healed but then reopened 2 months ago. The patient is a Financial controller on a Radiographer, therapeutic toed work boots however he is currently off on 3 weeks vacation his insurance doesn't. He is not a known diabetic although his ABI in this clinic is 1.63. He is going for a primary care clinic appointment at the end of this month. Quit smoking 20 years ago. Electronic Signature(s) Signed: 02/14/2016 4:24:05 PM By: Baltazar Najjar MD Entered By: Baltazar Najjar on 02/14/2016 09:01:43 [name], [name] ([id number]) -------------------------------------------------------------------------------- Physical Exam Details [name], Date of Service: 02/14/2016 8:00 AM Patient Name: [name] Patient Account Number: [id number] Medical Record Treating RN: Curtis Sites [id number] Number: Other Clinician: Date of Birth/Sex: December 20, 1955 (59 y.o. Male) Treating Amanuel Sinkfield Primary Care [facility], [place] Physician/Extender: G Physician: Referring Physician: Bayard Males Weeks in Treatment: 0 Constitutional Sitting or standing Blood Pressure is within target range for patient.. Pulse regular and within target range for patient.Marland Kitchen Respirations regular, non-labored and within target range.. Temperature is normal and within the target range for the patient.. Eyes Conjunctivae clear. No discharge.Marland Kitchen Respiratory Respiratory effort is easy and  symmetric bilaterally. Rate is normal at rest and on room air.. Bilateral breath sounds are clear and equal in all lobes with no wheezes, rales or rhonchi.. Cardiovascular Heart rhythm and rate regular, without murmur or gallop.. Femoral arteries without bruits and pulses strong.. Pedal pulses palpable and strong bilaterally.. 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 Gastrointestinal (GI) Abdomen is soft and non-distended without masses or tenderness. Bowel sounds active in all quadrants.. No liver or spleen enlargement or tenderness.. Lymphatic None palpable in the popliteal or inguinal area. Psychiatric No evidence of depression, anxiety, or agitation. Calm, cooperative, and communicative. Appropriate interactions and affect.. Notes Wound exam; the area is located on the left medial ankle. Roughly the size of a quarter. Tightly adherent surface eschar and slough removed base of the wound debrided. He has surrounding erythema which is no doubt venous inflammation. He does not have much in the way of edema he does have visible varicosities Electronic Signature(s) Signed: 02/14/2016 4:24:05 PM By: Baltazar Najjar MD Entered By: Baltazar Najjar on 02/14/2016 09:04:03 [name], [name] ([id number]) -------------------------------------------------------------------------------- Physician Orders Details [name], Date of Service: 02/14/2016 8:00 AM Patient Name: [name] Patient Account Number: [id number] Medical Record Treating RN: Curtis Sites [id number] Number: Other Clinician: Date of Birth/Sex: 09/06/1955 (59 y.o. 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 o May Shower, gently pat wound dry prior to applying new dressing. Anesthetic Wound #2 Left,Medial Lower Leg o Topical Lidocaine 4% cream applied to wound bed prior to debridement Primary Wound Dressing Wound #2 Left,Medial Lower Leg o Santyl Ointment Secondary Dressing Wound #2 Left,Medial Lower Leg o ABD pad Dressing Change Frequency Wound #2 Left,Medial Lower Leg o Change dressing every week Follow-up Appointments Wound #2 Left,Medial Lower Leg o Return Appointment in 1 week. Edema Control [name],  [name] ([id number]) Wound #2 Left,Medial Lower Leg o 3 Layer Compression System - Left Lower Extremity o Elevate legs to the level of the heart and pump ankles as often as possible Medications-please add to medication list. Wound #2 Left,Medial Lower Leg o Santyl Enzymatic Ointment o Other: - TCA to periwound Electronic Signature(s) Signed: 02/14/2016 4:23:15 PM By: Curtis Sites Signed: 02/14/2016 4:24:05 PM By: Baltazar Najjar MD Entered By: Curtis Sites on 02/14/2016 08:54:11 [name], [name] ([id number]) -------------------------------------------------------------------------------- Problem List Details [name], Date of Service: 02/14/2016 8:00 AM Patient Name: [name] Patient Account Number: [id number] Medical Record Treating RN: Curtis Sites [id number] Number: Other Clinician: Date of Birth/Sex: March 20, 1956 (59 y.o. Male) Treating Lorilee Cafarella Primary Care [place], [place] Physician/Extender: G Physician: Referring Physician: Bayard Males Weeks in Treatment: 0 Active Problems ICD-10 Encounter Code Description Active Date Diagnosis L97.221 Non-pressure  chronic ulcer of left calf limited to 02/14/2016 Yes breakdown of skin I87.332 Chronic venous hypertension (idiopathic) with ulcer and 02/14/2016 Yes inflammation of left lower extremity Inactive Problems Resolved Problems Electronic Signature(s) Signed: 02/14/2016 4:24:05 PM By: Baltazar Najjar MD Entered By: Baltazar Najjar on 02/14/2016 08:57:39 [name], [name] ([id number]) -------------------------------------------------------------------------------- Progress Note/History and Physical Details [name], Date of Service: 02/14/2016 8:00 AM Patient Name: [name] Patient Account Number: [id number] Medical Record Treating RN: Curtis Sites [id number] Number: Other Clinician: Date of Birth/Sex: [birth year] (59 y.o. Male) Treating Kamilia Carollo Primary Care [facility], [place] Physician/Extender: G Physician: Referring Physician: Bayard Males Weeks in Treatment: 0 Subjective Chief Complaint Information obtained from Patient Chronic left calf/ankle ulcer. 02/14/16 patient is back in clinic with a wound in the same area of his left medial ankle History of Present Illness (HPI) Pleasant 60 year old with no significant past medical history. No history of diabetes or peripheral arterial disease. History obtained from interpreter. He says that he developed a spontaneous ulceration on his left medial ankle in July 2016. He thinks that it was secondary to a "ruptured vein". He denies any history of venous stasis ulcerations or of DVT. Ambulating per his baseline. 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 No claudication or rest pain. Underwent ultrasound on 04/18/2015 at [place] [facility]. No DVT. No evidence for venous insufficiency. Foam sclerotherapy recommended. Performing dressing changes with Medihoney and using Tubigrip for edema control. Declined compression bandages. He returns to clinic for follow-up and is without new complaints. No significant pain. No fever or  chills. Minimal to no drainage. Declined debridement and compression bandage today. 02/14/16; this is a 60 year old Hispanic man who speaks minimal [place]. He is accompanied today by his daughter who speaks [place] as well as a Nurse, learning disability. 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 He was here last year in the fall cared for by Dr. Lawerance Bach., I was not involved his with his care and as far as I know none of our current physician staff was. In any case at that time he was apparently noncompliant with compression. He would not allow debridement. He did go to see vein and vascular who recommended that ablation of his varicosities however he apparently did not go through this. According to his daughter the wound actually healed but then reopened 2 months ago. The patient is a Financial controller on a Radiographer, therapeutic toed work boots however he is currently off on 3 weeks vacation his insurance doesn't. He is not a known diabetic although his ABI in this clinic is 1.63. He is going for a primary care clinic appointment at the  end of this month. Quit smoking 20 years ago. Wound History Patient presents with 1 open wound that has been present for approximately 2 months. Laboratory tests [name], [name] ([id number]) have not been performed in the last month. Patient reportedly has not tested positive for an antibiotic resistant organism. Patient reportedly has not tested positive for osteomyelitis. Patient reportedly has had testing performed to evaluate circulation in the legs. Patient History Information obtained from Patient. Allergies No Known Drug Allergies Family History No family history of Cancer, Diabetes, Heart Disease, Hypertension, Kidney Disease, Lung Disease, Stroke, Thyroid Problems. Social History Former smoker - quit 20 years ago, Marital Status - Married, Alcohol Use - Never, Drug Use - No History, Caffeine Use - Never. Medical History Neurologic Patient has  history of Neuropathy - left foot Review of Systems (ROS) Constitutional Symptoms (General Health) The patient has no complaints or symptoms. Eyes The patient has no complaints or symptoms. Ear/Nose/Mouth/Throat The patient has no complaints or symptoms. Hematologic/Lymphatic The patient has no complaints or symptoms. Respiratory The patient has no complaints or symptoms. Cardiovascular The patient has no complaints or symptoms. Gastrointestinal The patient has no complaints or symptoms. Endocrine The patient has no complaints or symptoms. Genitourinary The patient has no complaints or symptoms. Immunological The patient has no complaints or symptoms. Integumentary (Skin) Complains or has symptoms of Wounds - history. Musculoskeletal The patient has no complaints or symptoms. Neurologic The patient has no complaints or symptoms. Oncologic [place], [name] ([id number]) The patient has no complaints or symptoms. Psychiatric The patient has no complaints or symptoms. Objective Constitutional Sitting or standing Blood Pressure is within target range for patient.. Pulse regular and within target range for patient.Marland Kitchen Respirations regular, non-labored and within target range.. Temperature is normal and within the target range for the patient.. Vitals Time Taken: 8:19 AM, Height: 71 in, Source: Stated, Weight: 171 lbs, Source: Measured, BMI: 23.8, Temperature: 97.5 F, Pulse: 69 bpm, Respiratory Rate: 18 breaths/min, Blood Pressure: 102/70 mmHg. Eyes Conjunctivae clear. No discharge.Marland Kitchen Respiratory Respiratory effort is easy and symmetric bilaterally. Rate is normal at rest and on room air.. Bilateral breath sounds are clear and equal in all lobes with no wheezes, rales or rhonchi.. Cardiovascular Heart rhythm and rate regular, without murmur or gallop.. Femoral arteries without bruits and pulses strong.. Pedal pulses palpable and strong bilaterally.. The patient has  varicosities in the left leg and a localize area of venous inflammation around the wound on the left medial leg.. Gastrointestinal (GI) Abdomen is soft and non-distended without masses or tenderness. Bowel sounds active in all quadrants.. No liver or spleen enlargement or tenderness.. Lymphatic None palpable in the popliteal or inguinal area. Psychiatric No evidence of depression, anxiety, or agitation. Calm, cooperative, and communicative. Appropriate interactions and affect.. General Notes: Wound exam; the area is located on the left medial ankle. Roughly the size of a quarter. Tightly adherent surface eschar and slough removed base of the wound debrided. He has surrounding erythema which is no doubt venous inflammation. He does not have much in the way of edema he does have visible varicosities Integumentary (Hair, Skin) [name], [name] ([id number]) Wound #2 status is Open. Original cause of wound was Gradually Appeared. The wound is located on the Left,Medial Lower Leg. The wound measures 2.6cm length x 1.7cm width x 0.2cm depth; 3.471cm^2 area and 0.694cm^3 volume. The wound is limited to skin breakdown. There is no tunneling or undermining noted. There is a medium amount of serous drainage noted. The wound margin  is flat and intact. There is no granulation within the wound bed. There is a large (67-100%) amount of necrotic tissue within the wound bed including Eschar and Adherent Slough. The periwound skin appearance exhibited: Erythema. The periwound skin appearance did not exhibit: Callus, Crepitus, Excoriation, Fluctuance, Friable, Induration, Localized Edema, Rash, Scarring, Dry/Scaly, Maceration, Moist, Atrophie Blanche, Cyanosis, Ecchymosis, Hemosiderin Staining, Mottled, Pallor, Rubor. The surrounding wound skin color is noted with erythema which is circumferential. Periwound temperature was noted as No Abnormality. The periwound has tenderness on  palpation. Assessment Active Problems ICD-10 L97.221 - Non-pressure chronic ulcer of left calf limited to breakdown of skin I87.332 - Chronic venous hypertension (idiopathic) with ulcer and inflammation of left lower extremity Procedures Wound #2 Wound #2 is a Venous Leg Ulcer located on the Left,Medial Lower Leg . There was a Skin/Subcutaneous Tissue Debridement ([id number]) debridement with total area of 4.42 sq cm performed by Maxwell Caul, MD. with the following instrument(s): Curette to remove Viable and Non-Viable tissue/material including Fibrin/Slough, Eschar, and Subcutaneous after achieving pain control using Lidocaine 4% Topical Solution. A time out was conducted prior to the start of the procedure. A Minimum amount of bleeding was controlled with Pressure. The procedure was tolerated well with a pain level of 0 throughout and a pain level of 0 following the procedure. Post Debridement Measurements: 2.6cm length x 1.7cm width x 0.3cm depth; 1.041cm^3 volume. Post procedure Diagnosis Wound #2: Same as Pre-Procedure Plan Wound Cleansing: ADRIEN SHANKAR, [name] ([id number]) Wound #2 Left,Medial Lower Leg: Clean wound with Normal Saline. May Shower, gently pat wound dry prior to applying new dressing. Anesthetic: Wound #2 Left,Medial Lower Leg: Topical Lidocaine 4% cream applied to wound bed prior to debridement Primary Wound Dressing: Wound #2 Left,Medial Lower Leg: Santyl Ointment Secondary Dressing: Wound #2 Left,Medial Lower Leg: ABD pad Dressing Change Frequency: Wound #2 Left,Medial Lower Leg: Change dressing every week Follow-up Appointments: Wound #2 Left,Medial Lower Leg: Return Appointment in 1 week. Edema Control: Wound #2 Left,Medial Lower Leg: 3 Layer Compression System - Left Lower Extremity Elevate legs to the level of the heart and pump ankles as often as possible Medications-please add to medication list.: Wound #2 Left,Medial Lower Leg: Santyl  Enzymatic Ointment Other: - TCA to periwound #1 he will need Santyl to the wound surface. I gave him the option of leaving this on for week versus prescription and changing at home he opted for the former. TCA liberally to the periwound, ABD pad and a Profore light #2 in spite of the fact he is noncompressible with a ABI of 1.63 in this clinic I doubt he has significant PAD. His pulses are robust papillary refill time is normal. His forefoot is warm #3 I will try to review his venous reflux studies and is vascular consultation. He did not care if through with the ablations last time Electronic Signature(s) Signed: 02/14/2016 4:24:05 PM By: Baltazar Najjar MD Entered By: Baltazar Najjar on 02/14/2016 09:05:43 [name], [name] ([id number]) -------------------------------------------------------------------------------- ROS/PFSH Details [name], Date of Service: 02/14/2016 8:00 AM Patient Name: [name] Patient Account Number: [id number] Medical Record Treating RN: Curtis Sites [id number] Number: Other Clinician: Date of Birth/Sex: 22-Apr-1956 (59 y.o. Male) Treating Sheenah Dimitroff Primary Care [facility], [place] Physician/Extender: G Physician: Referring Physician: Bayard Males Weeks in Treatment: 0 Label Progress Note Print Version as History and Physical for this encounter Information Obtained From Patient Wound History Do you currently have one or more open woundso Yes How many open wounds do you currently haveo 1 Approximately  how long have you had your woundso 2 months Has your wound(s) ever healed and then re-openedo No Have you had any lab work done in the past montho No Have you tested positive for an antibiotic resistant organism (MRSA, VRE)o No Have you tested positive for osteomyelitis (bone infection)o No Have you had any tests for circulation on your legso Yes Who ordered the testo [facility] Where was the test doneo [facility] Integumentary (Skin) Complaints and  Symptoms: Positive for: Wounds - history Constitutional Symptoms (General Health) Complaints and Symptoms: No Complaints or Symptoms Eyes Complaints and Symptoms: No Complaints or Symptoms Ear/Nose/Mouth/Throat Complaints and Symptoms: No Complaints or Symptoms Hematologic/Lymphatic [name], [name] ([id number]) Complaints and Symptoms: No Complaints or Symptoms Respiratory Complaints and Symptoms: No Complaints or Symptoms Cardiovascular Complaints and Symptoms: No Complaints or Symptoms Gastrointestinal Complaints and Symptoms: No Complaints or Symptoms Endocrine Complaints and Symptoms: No Complaints or Symptoms Genitourinary Complaints and Symptoms: No Complaints or Symptoms Immunological Complaints and Symptoms: No Complaints or Symptoms Musculoskeletal Complaints and Symptoms: No Complaints or Symptoms Neurologic Complaints and Symptoms: No Complaints or Symptoms Medical History: Positive for: Neuropathy - left foot Oncologic Complaints and Symptoms: No Complaints or Symptoms Psychiatric [name], [name] ([id number]) Complaints and Symptoms: No Complaints or Symptoms Family and Social History Cancer: No; Diabetes: No; Heart Disease: No; Hypertension: No; Kidney Disease: No; Lung Disease: No; Stroke: No; Thyroid Problems: No; Former smoker - quit 20 years ago; Marital Status - Married; Alcohol Use: Never; Drug Use: No History; Caffeine Use: Never; Financial Concerns: No; Food, Clothing or Shelter Needs: No; Support System Lacking: No; Transportation Concerns: No; Advanced Directives: No; Patient does not want information on Advanced Directives; Living Will: No; Medical Power of Attorney: No Electronic Signature(s) Signed: 02/14/2016 4:23:15 PM By: Curtis Sites Signed: 02/14/2016 4:24:05 PM By: Baltazar Najjar MD Entered By: Curtis Sites on 02/14/2016 08:23:05 [name], [name]  ([id number]) -------------------------------------------------------------------------------- SuperBill Details [name], Date of Service: 02/14/2016 Patient Name: [name] Patient Account Number: [id number] Medical Record Treating RN: Curtis Sites [id number] Number: Other Clinician: Date of Birth/Sex: February 23, 1956 (59 y.o. Male) Treating Sartaj Hoskin Primary Care [facility], [place] Physician/Extender: G Physician: Tania Ade in Treatment: 0 Referring Physician: Bayard Males Diagnosis Coding ICD-10 Codes Code Description [phone] Non-pressure chronic ulcer of left calf limited to breakdown of skin Chronic venous hypertension (idiopathic) with ulcer and inflammation of left lower I87.332 extremity Facility Procedures CPT4 Code Description: [id number] 99213 - WOUND CARE VISIT-LEV 3 EST PT Modifier: Quantity: 1 CPT4 Code Description: [id number] 11042 - DEB SUBQ TISSUE 20 SQ CM/< ICD-10 Description Diagnosis L97.221 Non-pressure chronic ulcer of left calf limited to Modifier: breakdown of s Quantity: 1 kin Physician Procedures CPT4 Code Description: [id number] WC PHYS LEVEL 3 o NEW PT ICD-10 Description Diagnosis L97.221 Non-pressure chronic ulcer of left calf limited to Modifier: breakdown of sk Quantity: 1 in CPT4 Code Description: [id number] 11042 - WC PHYS SUBQ TISS 20 SQ CM ICD-10 Description Diagnosis L97.221 Non-pressure chronic ulcer of left calf limited to Modifier: breakdown of sk Quantity: 1 in Electronic Signature(s) Signed: 02/14/2016 4:24:05 PM By: Baltazar Najjar MD Entered By: Baltazar Najjar on 02/14/2016 09:06:23

## 2016-02-21 ENCOUNTER — Encounter: Payer: PRIVATE HEALTH INSURANCE | Admitting: Surgery

## 2016-02-21 DIAGNOSIS — I87332 Chronic venous hypertension (idiopathic) with ulcer and inflammation of left lower extremity: Secondary | ICD-10-CM | POA: Diagnosis not present

## 2016-02-21 NOTE — Progress Notes (Addendum)
[name], [name] ([id number]) Visit Report for 02/21/2016 Chief Complaint Document Details Patient Name: [name], [name] Date of Service: 02/21/2016 8:45 AM Medical Record Number: [id number] Patient Account Number: [id number] Date of Birth/Sex: 22-Aug-1955 (60 y.o. Male) Treating RN: Phillis Haggis Primary Care Physician: Maurine Minister Other Clinician: Referring Physician: Maurine Minister Treating Physician/Extender: Rudene Re in Treatment: 1 Information Obtained from: Patient Chief Complaint Chronic left calf/ankle ulcer. 02/14/16 patient is back in clinic with a wound in the same area of his left medial ankle Electronic Signature(s) Signed: 02/21/2016 9:12:49 AM By: Evlyn Kanner MD, FACS Entered By: Evlyn Kanner on 02/21/2016 09:12:48 [name], [name] ([id number]) -------------------------------------------------------------------------------- Debridement Details Patient Name: [name], [name] Date of Service: 02/21/2016 8:45 AM Medical Record Number: [id number] Patient Account Number: [id number] Date of Birth/Sex: 1956/05/27 (60 y.o. Male) Treating RN: Phillis Haggis Primary Care Physician: Maurine Minister Other Clinician: Referring Physician: Maurine Minister Treating Physician/Extender: Rudene Re in Treatment: 1 Debridement Performed for Wound #2 Left,Medial Lower Leg Assessment: Performed By: Physician Evlyn Kanner, MD Debridement: Debridement Pre-procedure Yes Verification/Time Out Taken: Start Time: 09:02 Pain Control: Other : lidocaine 4% cream Level: Skin/Subcutaneous Tissue Total Area Debrided (L x 1.5 (cm) x 1.6 (cm) = 2.4 (cm) W): Tissue and other Viable, Non-Viable, Exudate, Fibrin/Slough, Subcutaneous material debrided: Instrument: Curette Bleeding: Minimum Hemostasis Achieved: Pressure End Time: 09:04 Procedural Pain: 0 Post Procedural Pain: 0 Response to Treatment: Procedure was tolerated well Post  Debridement Measurements of Total Wound Length: (cm) 1.5 Width: (cm) 1.6 Depth: (cm) 0.2 Volume: (cm) 0.377 Post Procedure Diagnosis Same as Pre-procedure Electronic Signature(s) Signed: 02/21/2016 9:12:39 AM By: Evlyn Kanner MD, FACS Signed: 02/22/2016 5:07:39 PM By: Alejandro Mulling Entered By: Evlyn Kanner on 02/21/2016 09:12:39 [name], [name] ([id number]) -------------------------------------------------------------------------------- HPI Details Patient Name: [name], [name] Date of Service: 02/21/2016 8:45 AM Medical Record Number: [id number] Patient Account Number: [id number] Date of Birth/Sex: Dec 05, 1955 (60 y.o. Male) Treating RN: Phillis Haggis Primary Care Physician: Maurine Minister Other Clinician: Referring Physician: Maurine Minister Treating Physician/Extender: Rudene Re in Treatment: 1 History of Present Illness HPI Description: Pleasant 60 year old with no significant past medical history. No history of diabetes or peripheral arterial disease. History obtained from interpreter. He says that he developed a spontaneous ulceration on his left medial ankle in July 2016. He thinks that it was secondary to a "ruptured vein". He denies any history of venous stasis ulcerations or of DVT. Ambulating per his baseline. 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 No claudication or rest pain. Underwent ultrasound on 04/18/2015 at [facility]. No DVT. No evidence for venous insufficiency. Foam sclerotherapy recommended. Performing dressing changes with Medihoney and using Tubigrip for edema control. Declined compression bandages. He returns to clinic for follow-up and is without new complaints. No significant pain. No fever or chills. Minimal to no drainage. Declined debridement and compression bandage today. 02/14/16; this is a 60 year old Hispanic man who speaks minimal [place]. He is accompanied today by his daughter who speaks [place] as  well as a Nurse, learning disability. 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 He was here last year in the fall cared for by Dr. Lawerance Bach., I was not involved his with his care and as far as I know none of our current physician staff was. In any case at that time he was apparently noncompliant with compression. He would not allow debridement. He did go to see vein and vascular who recommended that ablation of his varicosities however he apparently  did not go through this. According to his daughter the wound actually healed but then reopened 2 months ago. The patient is a Financial controller on a Radiographer, therapeutic toed work boots however he is currently off on 3 weeks vacation his insurance doesn't. He is not a known diabetic although his ABI in this clinic is 1.63. He is going for a primary care clinic appointment at the end of this month. Quit smoking 20 years ago. 02/21/2016 -- review of his vascular consult in October 2016 --venous duplex study showed no incompetence of the left greater small saphenous vein and no evidence of deep vein thrombosis or superficial thrombophlebitis. Dr. Wyn Quaker was surprised at this result as he did have visible enlarged veins throughout the left calf. Clinical exam did confirm extensive varicosities and mild stasis dermatitis left lower extremity and scattered varicosities on the right. Dr. Wyn Quaker had recommended foam sclerotherapy to be performed for treatment of his varicose disease in the left lower extremity and had discussed this with the patient in October 2016 Electronic Signature(s) Signed: 02/21/2016 9:12:58 AM By: Evlyn Kanner MD, FACS Previous Signature: 02/21/2016 8:45:39 AM Version By: Evlyn Kanner MD, FACS Entered By: Evlyn Kanner on 02/21/2016 09:12:58 [name], [name] ([id number]) [name], [name] ([id number]) -------------------------------------------------------------------------------- Physical Exam Details Patient Name: [name],  [name] Date of Service: 02/21/2016 8:45 AM Medical Record Number: [id number] Patient Account Number: [id number] Date of Birth/Sex: 1956-06-04 (60 y.o. Male) Treating RN: Phillis Haggis Primary Care Physician: Maurine Minister Other Clinician: Referring Physician: Maurine Minister Treating Physician/Extender: Rudene Re in Treatment: 1 Constitutional . Pulse regular. Respirations normal and unlabored. Afebrile. . Eyes Nonicteric. Reactive to light. Ears, Nose, Mouth, and Throat Lips, teeth, and gums WNL.Marland Kitchen Moist mucosa without lesions. Neck supple and nontender. No palpable supraclavicular or cervical adenopathy. Normal sized without goiter. Respiratory WNL. No retractions.. Cardiovascular Pedal Pulses WNL. No clubbing, cyanosis or edema. Chest Breasts symmetical and no nipple discharge.. Breast tissue WNL, no masses, lumps, or tenderness.. Lymphatic No adneopathy. No adenopathy. No adenopathy. Musculoskeletal Adexa without tenderness or enlargement.. Digits and nails w/o clubbing, cyanosis, infection, petechiae, ischemia, or inflammatory conditions.. Integumentary (Hair, Skin) No suspicious lesions. No crepitus or fluctuance. No peri-wound warmth or erythema. No masses.Marland Kitchen Psychiatric Judgement and insight Intact.. No evidence of depression, anxiety, or agitation.. Notes the wound is very clean today with minimal amount of subcutaneous debris in the subcutaneous area and I have sharply removed this with a #3 curet and bleeding was brisk but controlled with pressure. His typical stigmata of varicose veins and stasis dermatitis Electronic Signature(s) Signed: 02/21/2016 9:13:56 AM By: Evlyn Kanner MD, FACS Entered By: Evlyn Kanner on 02/21/2016 09:13:55 [name], [name] ([id number]) -------------------------------------------------------------------------------- Physician Orders Details Patient Name: [name], [name] Date of Service: 02/21/2016 8:45 AM Medical  Record Number: [id number] Patient Account Number: [id number] Date of Birth/Sex: December 23, 1955 (60 y.o. Male) Treating RN: Phillis Haggis Primary Care Physician: Maurine Minister Other Clinician: Referring Physician: Maurine Minister Treating Physician/Extender: Rudene Re in Treatment: 1 Verbal / Phone Orders: Yes ClinicianAshok Cordia, Debi Read Back and Verified: Yes Diagnosis Coding Wound Cleansing Wound #2 Left,Medial Lower Leg o Clean wound with Normal Saline. o Cleanse wound with mild soap and water o May shower with protection. Anesthetic Wound #2 Left,Medial Lower Leg o Topical Lidocaine 4% cream applied to wound bed prior to debridement - for clinic use Skin Barriers/Peri-Wound Care Wound #2 Left,Medial Lower Leg o Barrier cream o Moisturizing lotion Primary Wound Dressing Wound #2 Left,Medial Lower Leg   o Aquacel Ag Secondary Dressing Wound #2 Left,Medial Lower Leg o ABD pad o Dry Gauze Dressing Change Frequency Wound #2 Left,Medial Lower Leg o Change dressing every week Follow-up Appointments Wound #2 Left,Medial Lower Leg o Return Appointment in 1 week. Edema Control Wound #2 Left,Medial Lower Leg o 3 Layer Compression System - Left Lower Extremity KEROLOS NEHME, [name] ([id number]) o Elevate legs to the level of the heart and pump ankles as often as possible Electronic Signature(s) Signed: 02/21/2016 11:46:41 AM By: Evlyn Kanner MD, FACS Signed: 02/22/2016 5:07:39 PM By: Alejandro Mulling Entered By: Alejandro Mulling on 02/21/2016 09:19:12 [name], [name] ([id number]) -------------------------------------------------------------------------------- Problem List Details Patient Name: [name], [name] Date of Service: 02/21/2016 8:45 AM Medical Record Number: [id number] Patient Account Number: [id number] Date of Birth/Sex: 1955-08-16 (60 y.o. Male) Treating RN: Phillis Haggis Primary Care Physician: Maurine Minister Other Clinician: Referring Physician: Merlinda Frederick, MIRIAM Treating Physician/Extender: Rudene Re in Treatment: 1 Active Problems ICD-10 Encounter Code Description Active Date Diagnosis L97.221 Non-pressure chronic ulcer of left calf limited to 02/14/2016 Yes breakdown of skin I87.332 Chronic venous hypertension (idiopathic) with ulcer and 02/14/2016 Yes inflammation of left lower extremity Inactive Problems Resolved Problems Electronic Signature(s) Signed: 02/21/2016 9:12:28 AM By: Evlyn Kanner MD, FACS Entered By: Evlyn Kanner on 02/21/2016 09:12:28 [name], [name] ([id number]) -------------------------------------------------------------------------------- Progress Note Details Patient Name: [name], [name] Date of Service: 02/21/2016 8:45 AM Medical Record Number: [id number] Patient Account Number: [id number] Date of Birth/Sex: [birth year] (60 y.o. Male) Treating RN: Phillis Haggis Primary Care Physician: Maurine Minister Other Clinician: Referring Physician: Maurine Minister Treating Physician/Extender: Rudene Re in Treatment: 1 Subjective Chief Complaint Information obtained from Patient Chronic left calf/ankle ulcer. 02/14/16 patient is back in clinic with a wound in the same area of his left medial ankle History of Present Illness (HPI) Pleasant 61 year old with no significant past medical history. No history of diabetes or peripheral arterial disease. History obtained from interpreter. He says that he developed a spontaneous ulceration on his left medial ankle in July 2016. He thinks that it was secondary to a "ruptured vein". He denies any history of venous stasis ulcerations or of DVT. Ambulating per his baseline. 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 No claudication or rest pain. Underwent ultrasound on 04/18/2015 at [facility]. No DVT. No evidence for venous insufficiency. Foam sclerotherapy  recommended. Performing dressing changes with Medihoney and using Tubigrip for edema control. Declined compression bandages. He returns to clinic for follow-up and is without new complaints. No significant pain. No fever or chills. Minimal to no drainage. Declined debridement and compression bandage today. 02/14/16; this is a 60 year old Hispanic man who speaks minimal [place]. He is accompanied today by his daughter who speaks [place] as well as a Nurse, learning disability. 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 He was here last year in the fall cared for by Dr. Lawerance Bach., I was not involved his with his care and as far as I know none of our current physician staff was. In any case at that time he was apparently noncompliant with compression. He would not allow debridement. He did go to see vein and vascular who recommended that ablation of his varicosities however he apparently did not go through this. According to his daughter the wound actually healed but then reopened 2 months ago. The patient is a Financial controller on a Radiographer, therapeutic toed work boots however he is currently off on 3 weeks vacation his insurance doesn't. He  is not a known diabetic although his ABI in this clinic is 1.63. He is going for a primary care clinic appointment at the end of this month. Quit smoking 20 years ago. 02/21/2016 -- review of his vascular consult in October 2016 --venous duplex study showed no incompetence of the left greater small saphenous vein and no evidence of deep vein thrombosis or superficial thrombophlebitis. Dr. Wyn Quakerew was surprised at this result as he did have visible enlarged veins throughout the left calf. Clinical exam did confirm extensive varicosities and mild stasis dermatitis left lower extremity and scattered varicosities on the right. Dr. Wyn Quakerew had recommended foam sclerotherapy to be performed for treatment of his varicose disease in the [facility], [place] ([id number]) left lower  extremity and had discussed this with the patient in October 2016 Objective Constitutional Pulse regular. Respirations normal and unlabored. Afebrile. Vitals Time Taken: 8:50 AM, Height: 71 in, Weight: 171 lbs, BMI: 23.8, Temperature: 97.9 F, Pulse: 61 bpm, Respiratory Rate: 18 breaths/min, Blood Pressure: 95/63 mmHg. Eyes Nonicteric. Reactive to light. Ears, Nose, Mouth, and Throat Lips, teeth, and gums WNL.Marland Kitchen. Moist mucosa without lesions. Neck supple and nontender. No palpable supraclavicular or cervical adenopathy. Normal sized without goiter. Respiratory WNL. No retractions.. Cardiovascular Pedal Pulses WNL. No clubbing, cyanosis or edema. Chest Breasts symmetical and no nipple discharge.. Breast tissue WNL, no masses, lumps, or tenderness.. Lymphatic No adneopathy. No adenopathy. No adenopathy. Musculoskeletal Adexa without tenderness or enlargement.. Digits and nails w/o clubbing, cyanosis, infection, petechiae, ischemia, or inflammatory conditions.Marland Kitchen. Psychiatric Judgement and insight Intact.. No evidence of depression, anxiety, or agitation.. General Notes: the wound is very clean today with minimal amount of subcutaneous debris in the subcutaneous area and I have sharply removed this with a #3 curet and bleeding was brisk but controlled with pressure. His typical stigmata of varicose veins and stasis dermatitis Integumentary (Hair, Skin) [name], [place] ([id number]) No suspicious lesions. No crepitus or fluctuance. No peri-wound warmth or erythema. No masses.. Wound #2 status is Open. Original cause of wound was Gradually Appeared. The wound is located on the Left,Medial Lower Leg. The wound measures 1.5cm length x 1.6cm width x 0.2cm depth; 1.885cm^2 area and 0.377cm^3 volume. The wound is limited to skin breakdown. There is no tunneling or undermining noted. There is a large amount of serous drainage noted. The wound margin is flat and intact. There is small (1-33%)  granulation within the wound bed. There is a large (67-100%) amount of necrotic tissue within the wound bed including Eschar and Adherent Slough. The periwound skin appearance exhibited: Erythema. The periwound skin appearance did not exhibit: Callus, Crepitus, Excoriation, Fluctuance, Friable, Induration, Localized Edema, Rash, Scarring, Dry/Scaly, Maceration, Moist, Atrophie Blanche, Cyanosis, Ecchymosis, Hemosiderin Staining, Mottled, Pallor, Rubor. The surrounding wound skin color is noted with erythema which is circumferential. Periwound temperature was noted as No Abnormality. The periwound has tenderness on palpation. Assessment Active Problems ICD-10 L97.221 - Non-pressure chronic ulcer of left calf limited to breakdown of skin I87.332 - Chronic venous hypertension (idiopathic) with ulcer and inflammation of left lower extremity I have recommended we change her over to silver alginate and a 3 layer Profore lite. Elevation and exercise has been discussed with him in great detail I have strongly emphasized the need to follow-up with the vascular surgeon for foam sclerotherapy, is recommended in October 2016 but the patient had not been compliant. Agreeable to do this and we will ask for a vascular review again Procedures Wound #2 Wound #2 is a Venous Leg  Ulcer located on the Left,Medial Lower Leg . There was a Skin/Subcutaneous Tissue Debridement ([id number]) debridement with total area of 2.4 sq cm performed by Evlyn Kanner, MD. with the following instrument(s): Curette to remove Viable and Non-Viable tissue/material including Exudate, Fibrin/Slough, and Subcutaneous after achieving pain control using Other (lidocaine 4% cream). A time out was conducted prior to the start of the procedure. A Minimum amount of bleeding was controlled with Pressure. The procedure was tolerated well with a pain level of 0 throughout and a pain level of 0 following the procedure. Post Debridement  Measurements: 1.5cm length x 1.6cm width x 0.2cm depth; 0.377cm^3 [name], [name] ([id number]) volume. Post procedure Diagnosis Wound #2: Same as Pre-Procedure Plan Wound Cleansing: Wound #2 Left,Medial Lower Leg: Clean wound with Normal Saline. Cleanse wound with mild soap and water May shower with protection. Anesthetic: Wound #2 Left,Medial Lower Leg: Topical Lidocaine 4% cream applied to wound bed prior to debridement - for clinic use Skin Barriers/Peri-Wound Care: Wound #2 Left,Medial Lower Leg: Barrier cream Moisturizing lotion Primary Wound Dressing: Wound #2 Left,Medial Lower Leg: Aquacel Ag Secondary Dressing: Wound #2 Left,Medial Lower Leg: ABD pad Dry Gauze Dressing Change Frequency: Wound #2 Left,Medial Lower Leg: Change dressing every week Follow-up Appointments: Wound #2 Left,Medial Lower Leg: Return Appointment in 1 week. Edema Control: Wound #2 Left,Medial Lower Leg: 3 Layer Compression System - Left Lower Extremity Elevate legs to the level of the heart and pump ankles as often as possible I have recommended we change her over to silver alginate and a 3 layer Profore lite. Elevation and exercise has been discussed with him in great detail DAYSEAN TINKHAM, [name] ([id number]) I have strongly emphasized the need to follow-up with the vascular surgeon for foam sclerotherapy, is recommended in October 2016 but the patient had not been compliant. Agreeable to do this and we will ask for a vascular review again Electronic Signature(s) Signed: 02/23/2016 7:44:45 AM By: Evlyn Kanner MD, FACS Previous Signature: 02/21/2016 9:15:22 AM Version By: Evlyn Kanner MD, FACS Entered By: Evlyn Kanner on 02/23/2016 07:44:45 [name], [name] ([id number]) -------------------------------------------------------------------------------- SuperBill Details Patient Name: [name], [name] Date of Service: 02/21/2016 Medical Record Number: [id number] Patient Account Number:  [id number] Date of Birth/Sex: 05-06-1956 (60 y.o. 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

## 2016-02-23 NOTE — Progress Notes (Signed)
[name], [name] ([id number]) Visit Report for 02/21/2016 Arrival Information Details Patient Name: [name], [name] Date of Service: 02/21/2016 8:45 AM Medical Record Number: [id number] Patient Account Number: [id number] Date of Birth/Sex: May 15, 1956 (60 y.o. Male) Treating RN: Phillis Haggis Primary Care Physician: Maurine Minister Other Clinician: Referring Physician: Maurine Minister Treating Physician/Extender: Rudene Re in Treatment: 1 Visit Information History Since Last Visit All ordered tests and consults were completed: No Patient Arrived: Ambulatory Added or deleted any medications: No Arrival Time: 08:46 Any new allergies or adverse reactions: No Accompanied By: daughter, and translator Had a fall or experienced change in No activities of daily living that may affect Transfer Assistance: None risk of falls: Patient Identification Verified: Yes Signs or symptoms of abuse/neglect since last No Secondary Verification Process Yes visito Completed: Hospitalized since last visit: No Patient Requires Transmission- No Pain Present Now: Yes Based Precautions: Patient Has Alerts: Yes Patient Alerts: ABI L 1.63 Electronic Signature(s) Signed: 02/22/2016 5:07:39 PM By: Alejandro Mulling Entered By: Alejandro Mulling on 02/21/2016 08:47:25 [name], [name] ([id number]) -------------------------------------------------------------------------------- Encounter Discharge Information Details Patient Name: [name], [name] Date of Service: 02/21/2016 8:45 AM Medical Record Number: [id number] Patient Account Number: [id number] Date of Birth/Sex: August 20, 1955 (60 y.o. Male) Treating RN: Phillis Haggis Primary Care Physician: Maurine Minister Other Clinician: Referring Physician: Maurine Minister Treating Physician/Extender: Rudene Re in Treatment: 1 Encounter Discharge Information Items Discharge Pain Level: 0 Discharge Condition:  Stable Ambulatory Status: Ambulatory Discharge Destination: Home Transportation: Private Auto Accompanied By: self Schedule Follow-up Appointment: Yes Medication Reconciliation completed and provided to Patient/Care No Johnny Gorter: Provided on Clinical Summary of Care: 02/21/2016 Form Type Recipient Paper Patient IAG Electronic Signature(s) Signed: 02/21/2016 9:19:36 AM By: Gwenlyn Perking Entered By: Gwenlyn Perking on 02/21/2016 09:19:36 [name], [name] ([id number]) -------------------------------------------------------------------------------- Lower Extremity Assessment Details Patient Name: [name], [name] Date of Service: 02/21/2016 8:45 AM Medical Record Number: [id number] Patient Account Number: [id number] Date of Birth/Sex: Dec 19, 1955 (60 y.o. Male) Treating RN: Phillis Haggis Primary Care Physician: Maurine Minister Other Clinician: Referring Physician: Maurine Minister Treating Physician/Extender: Rudene Re in Treatment: 1 Edema Assessment Assessed: [Left: No] [Right: No] E[Left: dema] [Right: :] Calf Left: Right: Point of Measurement: 35 cm From Medial Instep 35.2 cm cm Ankle Left: Right: Point of Measurement: 11 cm From Medial Instep 22.5 cm cm Vascular Assessment Pulses: Posterior Tibial Dorsalis Pedis Palpable: [Left:Yes] Extremity colors, hair growth, and conditions: Extremity Color: [Left:Hyperpigmented] Hair Growth on Extremity: [Left:Yes] Temperature of Extremity: [Left:Warm] Capillary Refill: [Left:< 3 seconds] Toe Nail Assessment Left: Right: Thick: Yes Discolored: Yes Deformed: Yes Improper Length and Hygiene: No Electronic Signature(s) Signed: 02/22/2016 5:07:39 PM By: Alejandro Mulling Entered By: Alejandro Mulling on 02/21/2016 08:52:49 [name], [name] ([id number]) -------------------------------------------------------------------------------- Multi Wound Chart Details Patient Name: [name], [name] Date of  Service: 02/21/2016 8:45 AM Medical Record Number: [id number] Patient Account Number: [id number] Date of Birth/Sex: 21-Jun-1956 (60 y.o. Male) Treating RN: Phillis Haggis Primary Care Physician: Maurine Minister Other Clinician: Referring Physician: Merlinda Frederick, MIRIAM Treating Physician/Extender: Rudene Re in Treatment: 1 Vital Signs Height(in): 71 Pulse(bpm): 61 Weight(lbs): 171 Blood Pressure 95/63 (mmHg): Body Mass Index(BMI): 24 Temperature(F): 97.9 Respiratory Rate 18 (breaths/min): Photos: [2:No Photos] [N/A:N/A] Wound Location: [2:Left Lower Leg - Medial] [N/A:N/A] Wounding Event: [2:Gradually Appeared] [N/A:N/A] Primary Etiology: [2:Venous Leg Ulcer] [N/A:N/A] Comorbid History: [2:Neuropathy] [N/A:N/A] Date Acquired: [2:12/18/2015] [N/A:N/A] Weeks of Treatment: [2:1] [N/A:N/A] Wound Status: [2:Open] [N/A:N/A] Measurements L x W x D 1.5x1.6x0.2 [N/A:N/A] (cm) Area (cm) : [2:1.885] [N/A:N/A] Volume (cm) : [  2:0.377] [N/A:N/A] % Reduction in Area: [2:45.70%] [N/A:N/A] % Reduction in Volume: 45.70% [N/A:N/A] Classification: [2:Full Thickness Without Exposed Support Structures] [N/A:N/A] Exudate Amount: [2:Large] [N/A:N/A] Exudate Type: [2:Serous] [N/A:N/A] Exudate Color: [2:amber] [N/A:N/A] Wound Margin: [2:Flat and Intact] [N/A:N/A] Granulation Amount: [2:Small (1-33%)] [N/A:N/A] Necrotic Amount: [2:Large (67-100%)] [N/A:N/A] Necrotic Tissue: [2:Eschar, Adherent Slough] [N/A:N/A] Exposed Structures: [2:Fascia: No Fat: No Tendon: No Muscle: No Joint: No Bone: No] [N/A:N/A] Limited to Skin Breakdown Epithelialization: None N/A N/A Periwound Skin Texture: Edema: No N/A N/A Excoriation: No Induration: No Callus: No Crepitus: No Fluctuance: No Friable: No Rash: No Scarring: No Periwound Skin Maceration: No N/A N/A Moisture: Moist: No Dry/Scaly: No Periwound Skin Color: Erythema: Yes N/A N/A Atrophie Blanche: No Cyanosis: No Ecchymosis:  No Hemosiderin Staining: No Mottled: No Pallor: No Rubor: No Erythema Location: Circumferential N/A N/A Temperature: No Abnormality N/A N/A Tenderness on Yes N/A N/A Palpation: Wound Preparation: Ulcer Cleansing: N/A N/A Rinsed/Irrigated with Saline Topical Anesthetic Applied: Other: lidocaine 4% Treatment Notes Electronic Signature(s) Signed: 02/22/2016 5:07:39 PM By: Alejandro Mulling Entered By: Alejandro Mulling on 02/21/2016 08:54:00 [name], [name] ([id number]) -------------------------------------------------------------------------------- Multi-Disciplinary Care Plan Details Patient Name: [name], [name] Date of Service: 02/21/2016 8:45 AM Medical Record Number: [id number] Patient Account Number: [id number] Date of Birth/Sex: 1955-12-26 (60 y.o. Male) Treating RN: Phillis Haggis Primary Care Physician: Maurine Minister Other Clinician: Referring Physician: Maurine Minister Treating Physician/Extender: Rudene Re in Treatment: 1 Active Inactive Venous Leg Ulcer Nursing Diagnoses: Actual venous Insuffiency (use after diagnosis is confirmed) Goals: Patient/caregiver will verbalize understanding of disease process and disease management Date Initiated: 02/14/2016 Goal Status: Active Interventions: Provide education on venous insufficiency Notes: Wound/Skin Impairment Nursing Diagnoses: Impaired tissue integrity Goals: Patient/caregiver will verbalize understanding of skin care regimen Date Initiated: 02/14/2016 Goal Status: Active Ulcer/skin breakdown will have a volume reduction of 30% by week 4 Date Initiated: 02/14/2016 Goal Status: Active Ulcer/skin breakdown will have a volume reduction of 50% by week 8 Date Initiated: 02/14/2016 Goal Status: Active Ulcer/skin breakdown will have a volume reduction of 80% by week 12 Date Initiated: 02/14/2016 Goal Status: Active Ulcer/skin breakdown will heal within 14 weeks Date Initiated: 02/14/2016 Goal  Status: Active [name], [name] ([id number]) Interventions: Assess patient/caregiver ability to obtain necessary supplies Assess patient/caregiver ability to perform ulcer/skin care regimen upon admission and as needed Assess ulceration(s) every visit Notes: Electronic Signature(s) Signed: 02/22/2016 5:07:39 PM By: Alejandro Mulling Entered By: Alejandro Mulling on 02/21/2016 08:53:54 [name], [name] ([id number]) -------------------------------------------------------------------------------- Pain Assessment Details Patient Name: [name], [name] Date of Service: 02/21/2016 8:45 AM Medical Record Number: [id number] Patient Account Number: [id number] Date of Birth/Sex: [birth year] (60 y.o. Male) Treating RN: Phillis Haggis Primary Care Physician: Maurine Minister Other Clinician: Referring Physician: Maurine Minister Treating Physician/Extender: Rudene Re in Treatment: 1 Active Problems Location of Pain Severity and Description of Pain Patient Has Paino Yes Site Locations Pain Location: Pain in Ulcers Rate the pain. Current Pain Level: 4 Character of Pain Describe the Pain: Burning Pain Management and Medication Current Pain Management: Electronic Signature(s) Signed: 02/22/2016 5:07:39 PM By: Alejandro Mulling Entered By: Alejandro Mulling on 02/21/2016 08:47:52 [name], [name] ([id number]) -------------------------------------------------------------------------------- Patient/Caregiver Education Details Patient Name: [name], [name] Date of Service: 02/21/2016 8:45 AM Medical Record Number: [id number] Patient Account Number: [id number] Date of Birth/Gender: 1955/09/12 (60 y.o. Male) Treating RN: Phillis Haggis Primary Care Physician: Maurine Minister Other Clinician: Referring Physician: Maurine Minister Treating Physician/Extender: Rudene Re in Treatment: 1 Education Assessment Education Provided To: Patient Education  Topics  Provided Wound/Skin Impairment: Handouts: Other: do not get wrap wet Methods: Demonstration, Explain/Verbal Responses: State content correctly Electronic Signature(s) Signed: 02/22/2016 5:07:39 PM By: Alejandro MullingPinkerton, Debra Entered By: Alejandro MullingPinkerton, Debra on 02/21/2016 08:56:21 Cameron PollackACUNA Bell, [place] ([id number]) -------------------------------------------------------------------------------- Wound Assessment Details Patient Name: Cameron PollackACUNA Bell, [place] Date of Service: 02/21/2016 8:45 AM Medical Record Number: [id number] Patient Account Number: [id number] Date of Birth/Sex: [birth year](59 y.o. Male) Treating RN: Phillis HaggisPinkerton, Debi Primary Care Physician: Maurine MinisterMCLAUGHLIN, MIRIAM Other Clinician: Referring Physician: Maurine MinisterMCLAUGHLIN, MIRIAM Treating Physician/Extender: Rudene ReBritto, Errol Weeks in Treatment: 1 Wound Status Wound Number: 2 Primary Etiology: Venous Leg Ulcer Wound Location: Left Lower Leg - Medial Wound Status: Open Wounding Event: Gradually Appeared Comorbid History: Neuropathy Date Acquired: 12/18/2015 Weeks Of Treatment: 1 Clustered Wound: No Photos Photo Uploaded By: Alejandro MullingPinkerton, Debra on 02/21/2016 11:43:36 Wound Measurements Length: (cm) 1.5 Width: (cm) 1.6 Depth: (cm) 0.2 Area: (cm) 1.885 Volume: (cm) 0.377 % Reduction in Area: 45.7% % Reduction in Volume: 45.7% Epithelialization: None Tunneling: No Undermining: No Wound Description Full Thickness Without Exposed Classification: Support Structures Wound Margin: Flat and Intact Exudate Large Amount: Exudate Type: Serous Exudate Color: amber Foul Odor After Cleansing: No Wound Bed Granulation Amount: Small (1-33%) Exposed Structure Necrotic Amount: Large (67-100%) Fascia Exposed: No Necrotic Quality: Eschar, Adherent Slough Fat Layer Exposed: No [name], [place] ([id number]) Tendon Exposed: No Muscle Exposed: No Joint Exposed: No Bone Exposed: No Limited to Skin Breakdown Periwound Skin Texture Texture  Color No Abnormalities Noted: No No Abnormalities Noted: No Callus: No Atrophie Blanche: No Crepitus: No Cyanosis: No Excoriation: No Ecchymosis: No Fluctuance: No Erythema: Yes Friable: No Erythema Location: Circumferential Induration: No Hemosiderin Staining: No Localized Edema: No Mottled: No Rash: No Pallor: No Scarring: No Rubor: No Moisture Temperature / Pain No Abnormalities Noted: No Temperature: No Abnormality Dry / Scaly: No Tenderness on Palpation: Yes Maceration: No Moist: No Wound Preparation Ulcer Cleansing: Rinsed/Irrigated with Saline Topical Anesthetic Applied: Other: lidocaine 4%, Treatment Notes Wound #2 (Left, Medial Lower Leg) 1. Cleansed with: Cleanse wound with antibacterial soap and water 2. Anesthetic Topical Lidocaine 4% cream to wound bed prior to debridement 3. Peri-wound Care: Barrier cream Moisturizing lotion 4. Dressing Applied: Aquacel Ag 5. Secondary Dressing Applied ABD Pad Dry Gauze 7. Secured with Tape 3 Layer Compression System - Left Lower Extremity Notes [name], [place] ([id number]) Roland Rackunna to anchor Electronic Signature(s) Signed: 02/22/2016 5:07:39 PM By: Alejandro MullingPinkerton, Debra Entered By: Alejandro MullingPinkerton, Debra on 02/21/2016 08:53:49 [name], [place] ([id number]) -------------------------------------------------------------------------------- Vitals Details Patient Name: Cameron PollackACUNA Bell, [place] Date of Service: 02/21/2016 8:45 AM Medical Record Number: [id number] Patient Account Number: [id number] Date of Birth/Sex: [birth year](59 y.o. Male) Treating RN: Phillis HaggisPinkerton, Debi Primary Care Physician: Maurine MinisterMCLAUGHLIN, MIRIAM Other Clinician: Referring Physician: Maurine MinisterMCLAUGHLIN, MIRIAM Treating Physician/Extender: Rudene ReBritto, Errol Weeks in Treatment: 1 Vital Signs Time Taken: 08:50 Temperature (F): 97.9 Height (in): 71 Pulse (bpm): 61 Weight (lbs): 171 Respiratory Rate (breaths/min): 18 Body Mass Index (BMI): 23.8 Blood Pressure  (mmHg): 95/63 Reference Range: 80 - 120 mg / dl Electronic Signature(s) Signed: 02/22/2016 5:07:39 PM By: Alejandro MullingPinkerton, Debra Entered By: Alejandro MullingPinkerton, Debra on 02/21/2016 08:51:46

## 2016-02-28 ENCOUNTER — Encounter: Payer: PRIVATE HEALTH INSURANCE | Admitting: Surgery

## 2016-02-28 DIAGNOSIS — I87332 Chronic venous hypertension (idiopathic) with ulcer and inflammation of left lower extremity: Secondary | ICD-10-CM | POA: Diagnosis not present

## 2016-02-28 NOTE — Progress Notes (Addendum)
[name], [name] ([id number]) Visit Report for 02/28/2016 Chief Complaint Document Details Patient Name: [name], [name] Date of Service: 02/28/2016 9:30 AM Medical Record Number: [id number] Patient Account Number: [id number] Date of Birth/Sex: 1955/09/23 59(59 y.o. Male) Treating RN: Huel CoventryWoody, Kim Primary Care Physician: Maurine MinisterMCLAUGHLIN, MIRIAM Other Clinician: Referring Physician: Maurine MinisterMCLAUGHLIN, MIRIAM Treating Physician/Extender: Rudene ReBritto, Kristoff Coonradt Weeks in Treatment: 2 Information Obtained from: Patient Chief Complaint Chronic left calf/ankle ulcer. 02/14/16 patient is back in clinic with a wound in the same area of his left medial ankle Electronic Signature(s) Signed: 02/28/2016 10:04:02 AM By: Evlyn KannerBritto, Rozanne Heumann MD, FACS Entered By: Evlyn KannerBritto, Azile Minardi on 02/28/2016 10:04:02 [name], [name] ([id number]) -------------------------------------------------------------------------------- Debridement Details Patient Name: Cameron PollackACUNA Bell, [name] Date of Service: 02/28/2016 9:30 AM Medical Record Number: [id number] Patient Account Number: [id number] Date of Birth/Sex: 1955/09/23 [age] y.o. Male) Treating RN: Huel CoventryWoody, Kim Primary Care Physician: Maurine MinisterMCLAUGHLIN, MIRIAM Other Clinician: Referring Physician: Maurine MinisterMCLAUGHLIN, MIRIAM Treating Physician/Extender: Rudene ReBritto, Arlett Goold Weeks in Treatment: 2 Debridement Performed for Wound #2 Left,Medial Lower Leg Assessment: Performed By: Physician Evlyn KannerBritto, Aniel Hubble, MD Debridement: Debridement Pre-procedure Yes Verification/Time Out Taken: Start Time: 09:55 Pain Control: Other : lidocaine 4% Level: Skin/Subcutaneous Tissue Total Area Debrided (L x 1 (cm) x 1 (cm) = 1 (cm) W): Tissue and other Viable, Non-Viable, Exudate, Fibrin/Slough, Skin, Subcutaneous material debrided: Instrument: Curette Bleeding: Minimum Hemostasis Achieved: Pressure End Time: 10:01 Procedural Pain: 3 Post Procedural Pain: 4 Response to Treatment: Procedure was tolerated well Post Debridement  Measurements of Total Wound Length: (cm) 1 Width: (cm) 1 Depth: (cm) 0.2 Volume: (cm) 0.157 Post Procedure Diagnosis Same as Pre-procedure Electronic Signature(s) Signed: 02/28/2016 10:03:55 AM By: Evlyn KannerBritto, Kurtiss Wence MD, FACS Signed: 02/29/2016 5:06:33 PM By: Elliot GurneyWoody, RN, BSN, Kim RN, BSN Entered By: Evlyn KannerBritto, Nicholaos Schippers on 02/28/2016 10:03:54 [name], [name] ([id number]) -------------------------------------------------------------------------------- HPI Details Patient Name: [name], [name] Date of Service: 02/28/2016 9:30 AM Medical Record Number: [id number] Patient Account Number: [id number] Date of Birth/Sex: 1955/09/23 [age] y.o. Male) Treating RN: Huel CoventryWoody, Kim Primary Care Physician: Maurine MinisterMCLAUGHLIN, MIRIAM Other Clinician: Referring Physician: Maurine MinisterMCLAUGHLIN, MIRIAM Treating Physician/Extender: Rudene ReBritto, Kendric Sindelar Weeks in Treatment: 2 History of Present Illness HPI Description: Pleasant 60 year old with no significant past medical history. No history of diabetes or peripheral arterial disease. History obtained from interpreter. He says that he developed a spontaneous ulceration on his left medial ankle in July 2016. He thinks that it was secondary to a "ruptured vein". He denies any history of venous stasis ulcerations or of DVT. Ambulating per his baseline. 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 No claudication or rest pain. Underwent ultrasound on 04/18/2015 at [facility]. No DVT. No evidence for venous insufficiency. Foam sclerotherapy recommended. Performing dressing changes with Medihoney and using Tubigrip for edema control. Declined compression bandages. He returns to clinic for follow-up and is without new complaints. No significant pain. No fever or chills. Minimal to no drainage. Declined debridement and compression bandage today. 02/14/16; this is a 60 year old Hispanic man who speaks minimal [place]. He is accompanied today by his daughter who speaks [place] as well as  a Nurse, learning disabilitytranslator. 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 He was here last year in the fall cared for by Dr. Lawerance BachBurns., I was not involved his with his care and as far as I know none of our current physician staff was. In any case at that time he was apparently noncompliant with compression. He would not allow debridement. He did go to see vein and vascular who recommended that ablation of his  varicosities however he apparently did not go through this. According to his daughter the wound actually healed but then reopened 2 months ago. The patient is a Financial controller on a Radiographer, therapeutic toed work boots however he is currently off on 3 weeks vacation his insurance doesn't. He is not a known diabetic although his ABI in this clinic is 1.63. He is going for a primary care clinic appointment at the end of this month. Quit smoking 20 years ago. 02/21/2016 -- review of his vascular consult in October 2016 --venous duplex study showed no incompetence of the left greater small saphenous vein and no evidence of deep vein thrombosis or superficial thrombophlebitis. Dr. Wyn Quaker was surprised at this result as he did have visible enlarged veins throughout the left calf. Clinical exam did confirm extensive varicosities and mild stasis dermatitis left lower extremity and scattered varicosities on the right. Dr. Wyn Quaker had recommended foam sclerotherapy to be performed for treatment of his varicose disease in the left lower extremity and had discussed this with the patient in October 2016 02/28/2016 -- reviewed by Dr. Wyn Quaker on 02/23/2016 for the new ulceration on the left medial ankle. He also had clinical signs of venous insufficiency and varicosities and he recommended repeating a duplex since it was a year since the last one, and foam sclerotherapy or endovenous ablation if there was significant saphenous vein reflux. [name], [place] ([id number]) Electronic Signature(s) Signed: 02/28/2016 10:04:11  AM By: Evlyn Kanner MD, FACS Previous Signature: 02/28/2016 9:56:04 AM Version By: Evlyn Kanner MD, FACS Entered By: Evlyn Kanner on 02/28/2016 10:04:11 [name], [place] ([id number]) -------------------------------------------------------------------------------- Physical Exam Details Patient Name: [name], [place] Date of Service: 02/28/2016 9:30 AM Medical Record Number: [id number] Patient Account Number: [id number] Date of Birth/Sex: 17-Apr-1956 (60 y.o. Male) Treating RN: Huel Coventry Primary Care Physician: Maurine Minister Other Clinician: Referring Physician: Maurine Minister Treating Physician/Extender: Rudene Re in Treatment: 2 Constitutional . Pulse regular. Respirations normal and unlabored. Afebrile. . Eyes Nonicteric. Reactive to light. Ears, Nose, Mouth, and Throat Lips, teeth, and gums WNL.Marland Kitchen Moist mucosa without lesions. Neck supple and nontender. No palpable supraclavicular or cervical adenopathy. Normal sized without goiter. Respiratory WNL. No retractions.. Breath sounds WNL, No rubs, rales, rhonchi, or wheeze.. Cardiovascular Heart rhythm and rate regular, no murmur or gallop.. Pedal Pulses WNL. No clubbing, cyanosis or edema. Lymphatic No adneopathy. No adenopathy. No adenopathy. Musculoskeletal Adexa without tenderness or enlargement.. Digits and nails w/o clubbing, cyanosis, infection, petechiae, ischemia, or inflammatory conditions.. Integumentary (Hair, Skin) No suspicious lesions. No crepitus or fluctuance. No peri-wound warmth or erythema. No masses.Marland Kitchen Psychiatric Judgement and insight Intact.. No evidence of depression, anxiety, or agitation.. Notes he continues to have minimal subcutaneous debris and surrounding exudate and eschar and using a #3 curet and sharply removed this. Significant bleeding controlled with pressure Electronic Signature(s) Signed: 02/28/2016 10:05:07 AM By: Evlyn Kanner MD, FACS Entered By: Evlyn Kanner on  02/28/2016 10:05:06 [name], [place] ([id number]) -------------------------------------------------------------------------------- Physician Orders Details Patient Name: [name], [place] Date of Service: 02/28/2016 9:30 AM Medical Record Number: [id number] Patient Account Number: [id number] Date of Birth/Sex: [birth year] (60 y.o. Male) Treating RN: Huel Coventry Primary Care Physician: Maurine Minister Other Clinician: Referring Physician: Maurine Minister Treating Physician/Extender: Rudene Re in Treatment: 2 Verbal / Phone Orders: Yes Clinician: Huel Coventry Read Back and Verified: Yes Diagnosis Coding Wound Cleansing Wound #2 Left,Medial Lower Leg o Clean wound with Normal Saline. o Cleanse wound with mild soap and water o May shower with  protection. Anesthetic Wound #2 Left,Medial Lower Leg o Topical Lidocaine 4% cream applied to wound bed prior to debridement - for clinic use Skin Barriers/Peri-Wound Care Wound #2 Left,Medial Lower Leg o Barrier cream o Moisturizing lotion Primary Wound Dressing Wound #2 Left,Medial Lower Leg o Aquacel Ag Secondary Dressing Wound #2 Left,Medial Lower Leg o ABD pad o Dry Gauze Dressing Change Frequency Wound #2 Left,Medial Lower Leg o Change dressing every week Follow-up Appointments Wound #2 Left,Medial Lower Leg o Return Appointment in 1 week. Edema Control Wound #2 Left,Medial Lower Leg o 3 Layer Compression System - Left Lower Extremity [name], [place] ([id number]) o Elevate legs to the level of the heart and pump ankles as often as possible Electronic Signature(s) Signed: 02/28/2016 11:54:41 AM By: Evlyn Kanner MD, FACS Signed: 02/29/2016 5:06:33 PM By: Elliot Gurney RN, BSN, Kim RN, BSN Entered By: Elliot Gurney, RN, BSN, Kim on 02/28/2016 10:01:03 [name], [place] ([id number]) -------------------------------------------------------------------------------- Problem List Details Patient  Name: [name], [place] Date of Service: 02/28/2016 9:30 AM Medical Record Number: [id number] Patient Account Number: [id number] Date of Birth/Sex: 08/28/1955 (60 y.o. Male) Treating RN: Huel Coventry Primary Care Physician: Maurine Minister Other Clinician: Referring Physician: Maurine Minister Treating Physician/Extender: Rudene Re in Treatment: 2 Active Problems ICD-10 Encounter Code Description Active Date Diagnosis L97.221 Non-pressure chronic ulcer of left calf limited to 02/14/2016 Yes breakdown of skin I87.332 Chronic venous hypertension (idiopathic) with ulcer and 02/14/2016 Yes inflammation of left lower extremity Inactive Problems Resolved Problems Electronic Signature(s) Signed: 02/28/2016 10:03:43 AM By: Evlyn Kanner MD, FACS Entered By: Evlyn Kanner on 02/28/2016 10:03:42 [name], [place] ([id number]) -------------------------------------------------------------------------------- Progress Note Details Patient Name: [name], [place] Date of Service: 02/28/2016 9:30 AM Medical Record Number: [id number] Patient Account Number: [id number] Date of Birth/Sex: 1956/06/03 (60 y.o. Male) Treating RN: Huel Coventry Primary Care Physician: Maurine Minister Other Clinician: Referring Physician: Maurine Minister Treating Physician/Extender: Rudene Re in Treatment: 2 Subjective Chief Complaint Information obtained from Patient Chronic left calf/ankle ulcer. 02/14/16 patient is back in clinic with a wound in the same area of his left medial ankle History of Present Illness (HPI) Pleasant 60 year old with no significant past medical history. No history of diabetes or peripheral arterial disease. History obtained from interpreter. He says that he developed a spontaneous ulceration on his left medial ankle in July 2016. He thinks that it was secondary to a "ruptured vein". He denies any history of venous stasis ulcerations or of DVT. Ambulating per  his baseline. 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 No claudication or rest pain. Underwent ultrasound on 04/18/2015 at [facility]. No DVT. No evidence for venous insufficiency. Foam sclerotherapy recommended. Performing dressing changes with Medihoney and using Tubigrip for edema control. Declined compression bandages. He returns to clinic for follow-up and is without new complaints. No significant pain. No fever or chills. Minimal to no drainage. Declined debridement and compression bandage today. 02/14/16; this is a 60 year old Hispanic man who speaks minimal [place]. He is accompanied today by his daughter who speaks [place] as well as a Nurse, learning disability. 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 He was here last year in the fall cared for by Dr. Lawerance Bach., I was not involved his with his care and as far as I know none of our current physician staff was. In any case at that time he was apparently noncompliant with compression. He would not allow debridement. He did go to see vein and vascular who recommended that ablation of  his varicosities however he apparently did not go through this. According to his daughter the wound actually healed but then reopened 2 months ago. The patient is a Financial controller on a Radiographer, therapeutic toed work boots however he is currently off on 3 weeks vacation his insurance doesn't. He is not a known diabetic although his ABI in this clinic is 1.63. He is going for a primary care clinic appointment at the end of this month. Quit smoking 20 years ago. 02/21/2016 -- review of his vascular consult in October 2016 --venous duplex study showed no incompetence of the left greater small saphenous vein and no evidence of deep vein thrombosis or superficial thrombophlebitis. Dr. Wyn Quaker was surprised at this result as he did have visible enlarged veins throughout the left calf. Clinical exam did confirm extensive varicosities and mild stasis dermatitis  left lower extremity and scattered varicosities on the right. Dr. Wyn Quaker had recommended foam sclerotherapy to be performed for treatment of his varicose disease in the [facility], [place] ([id number]) left lower extremity and had discussed this with the patient in October 2016 02/28/2016 -- reviewed by Dr. Wyn Quaker on 02/23/2016 for the new ulceration on the left medial ankle. He also had clinical signs of venous insufficiency and varicosities and he recommended repeating a duplex since it was a year since the last one, and foam sclerotherapy or endovenous ablation if there was significant saphenous vein reflux. Objective Constitutional Pulse regular. Respirations normal and unlabored. Afebrile. Vitals Time Taken: 9:33 AM, Height: 71 in, Weight: 171 lbs, BMI: 23.8, Temperature: 97.8 F, Pulse: 70 bpm, Respiratory Rate: 18 breaths/min, Blood Pressure: 102/72 mmHg. Eyes Nonicteric. Reactive to light. Ears, Nose, Mouth, and Throat Lips, teeth, and gums WNL.Marland Kitchen Moist mucosa without lesions. Neck supple and nontender. No palpable supraclavicular or cervical adenopathy. Normal sized without goiter. Respiratory WNL. No retractions.. Breath sounds WNL, No rubs, rales, rhonchi, or wheeze.. Cardiovascular Heart rhythm and rate regular, no murmur or gallop.. Pedal Pulses WNL. No clubbing, cyanosis or edema. Lymphatic No adneopathy. No adenopathy. No adenopathy. Musculoskeletal Adexa without tenderness or enlargement.. Digits and nails w/o clubbing, cyanosis, infection, petechiae, ischemia, or inflammatory conditions.Marland Kitchen Psychiatric Judgement and insight Intact.. No evidence of depression, anxiety, or agitation.. General Notes: he continues to have minimal subcutaneous debris and surrounding exudate and eschar and using a #3 curet and sharply removed this. Significant bleeding controlled with pressure [name], [place] ([id number]) Integumentary (Hair, Skin) No suspicious lesions. No crepitus or  fluctuance. No peri-wound warmth or erythema. No masses.. Wound #2 status is Open. Original cause of wound was Gradually Appeared. The wound is located on the Left,Medial Lower Leg. The wound measures 1cm length x 1cm width x 0.2cm depth; 0.785cm^2 area and 0.157cm^3 volume. The wound is limited to skin breakdown. There is no tunneling or undermining noted. There is a large amount of serous drainage noted. The wound margin is flat and intact. There is small (1-33%) red, pink granulation within the wound bed. There is a medium (34-66%) amount of necrotic tissue within the wound bed including Adherent Slough. The periwound skin appearance exhibited: Scarring, Moist, Erythema. The periwound skin appearance did not exhibit: Callus, Crepitus, Excoriation, Fluctuance, Friable, Induration, Localized Edema, Rash, Dry/Scaly, Maceration, Atrophie Blanche, Cyanosis, Ecchymosis, Hemosiderin Staining, Mottled, Pallor, Rubor. The surrounding wound skin color is noted with erythema which is circumferential. Periwound temperature was noted as No Abnormality. The periwound has tenderness on palpation. Assessment Active Problems ICD-10 L97.221 - Non-pressure chronic ulcer of left calf limited to breakdown  of skin I87.332 - Chronic venous hypertension (idiopathic) with ulcer and inflammation of left lower extremity Procedures Wound #2 Wound #2 is a Venous Leg Ulcer located on the Left,Medial Lower Leg . There was a Skin/Subcutaneous Tissue Debridement ([id number]) debridement with total area of 1 sq cm performed by Evlyn KannerBritto, Anique Beckley, MD. with the following instrument(s): Curette to remove Viable and Non-Viable tissue/material including Exudate, Fibrin/Slough, Skin, and Subcutaneous after achieving pain control using Other (lidocaine 4%). A time out was conducted prior to the start of the procedure. A Minimum amount of bleeding was controlled with Pressure. The procedure was tolerated well with a pain level of 3  throughout and a pain level of 4 following the procedure. Post Debridement Measurements: 1cm length x 1cm width x 0.2cm depth; 0.157cm^3 volume. Post procedure Diagnosis Wound #2: Same as Pre-Procedure [name], [name] ([id number]) Plan Wound Cleansing: Wound #2 Left,Medial Lower Leg: Clean wound with Normal Saline. Cleanse wound with mild soap and water May shower with protection. Anesthetic: Wound #2 Left,Medial Lower Leg: Topical Lidocaine 4% cream applied to wound bed prior to debridement - for clinic use Skin Barriers/Peri-Wound Care: Wound #2 Left,Medial Lower Leg: Barrier cream Moisturizing lotion Primary Wound Dressing: Wound #2 Left,Medial Lower Leg: Aquacel Ag Secondary Dressing: Wound #2 Left,Medial Lower Leg: ABD pad Dry Gauze Dressing Change Frequency: Wound #2 Left,Medial Lower Leg: Change dressing every week Follow-up Appointments: Wound #2 Left,Medial Lower Leg: Return Appointment in 1 week. Edema Control: Wound #2 Left,Medial Lower Leg: 3 Layer Compression System - Left Lower Extremity Elevate legs to the level of the heart and pump ankles as often as possible I have recommended we continue with silver alginate and a 3 layer Profore lite. Elevation and exercise has been discussed with him in great detail I have strongly emphasized the need to follow-up with the vascular surgeon for foam sclerotherapy endovenous ablation as recommended by Dr. Wyn Quakerew recently. Electronic Signature(s) Signed: 02/28/2016 10:06:29 AM By: Evlyn KannerBritto, Shiv Shuey MD, FACS Cameron PollackACUNA Bell, [name] ([id number]) Entered By: Evlyn KannerBritto, Asli Tokarski on 02/28/2016 10:06:28 [name], [name] ([id number]) -------------------------------------------------------------------------------- SuperBill Details Patient Name: Cameron PollackACUNA Bell, [name] Date of Service: 02/28/2016 Medical Record Number: [id number] Patient Account Number: [id number] Date of Birth/Sex: 02-22-1956 [age] y.o. Male) Treating RN: Huel CoventryWoody,  Kim Primary Care Physician: Maurine MinisterMCLAUGHLIN, MIRIAM Other Clinician: Referring Physician: Maurine MinisterMCLAUGHLIN, MIRIAM Treating Physician/Extender: Rudene ReBritto, Cherie Lasalle Weeks in Treatment: 2 Diagnosis Coding ICD-10 Codes Code Description [phone] Non-pressure chronic ulcer of left calf limited to breakdown of skin Chronic venous hypertension (idiopathic) with ulcer and inflammation of left lower I87.332 extremity Facility Procedures CPT4: Description Modifier Quantity Code [id number] 11042 - DEB SUBQ TISSUE 20 SQ CM/< 1 ICD-10 Description Diagnosis L97.221 Non-pressure chronic ulcer of left calf limited to breakdown of skin I87.332 Chronic venous hypertension (idiopathic) with ulcer  and inflammation of left lower extremity Physician Procedures CPT4: Description Modifier Quantity Code [id number] 99213 - WC PHYS LEVEL 3 - EST PT 25 1 ICD-10 Description Diagnosis L97.221 Non-pressure chronic ulcer of left calf limited to breakdown of skin I87.332 Chronic venous hypertension (idiopathic) with ulcer  and inflammation of left lower extremity CPT4: [id number] 11042 - WC PHYS SUBQ TISS 20 SQ CM 1 ICD-10 Description Diagnosis L97.221 Non-pressure chronic ulcer of left calf limited to breakdown of skin I87.332 Chronic venous hypertension (idiopathic) with ulcer and inflammation of left lower  extremity Electronic Signature(s) [name], [name] ([id number]) Signed: 02/28/2016 10:06:51 AM By: Evlyn KannerBritto, Beverlee Wilmarth MD, FACS Entered By: Evlyn KannerBritto, Shakea Isip on 02/28/2016 10:06:50

## 2016-03-01 NOTE — Progress Notes (Signed)
[name], [name] ([id number]) Visit Report for 02/28/2016 Arrival Information Details Patient Name: [name], [name] Date of Service: 02/28/2016 9:30 AM Medical Record Number: [id number] Patient Account Number: [id number] Date of Birth/Sex: 09/30/1955 [age] y.o. Male) Treating RN: Huel CoventryWoody, Kim Primary Care Physician: Maurine MinisterMCLAUGHLIN, MIRIAM Other Clinician: Referring Physician: Maurine MinisterMCLAUGHLIN, MIRIAM Treating Physician/Extender: Rudene ReBritto, Errol Weeks in Treatment: 2 Visit Information History Since Last Visit Added or deleted any medications: No Patient Arrived: Ambulatory Any new allergies or adverse reactions: No Arrival Time: 09:31 Had a fall or experienced change in No Accompanied By: self activities of daily living that may affect Transfer Assistance: None risk of falls: Patient Identification Verified: Yes Signs or symptoms of abuse/neglect since last No Secondary Verification Process Yes visito Completed: Hospitalized since last visit: No Patient Requires Transmission-Based No Has Dressing in Place as Prescribed: Yes Precautions: Has Compression in Place as Prescribed: Yes Patient Has Alerts: Yes Pain Present Now: No Patient Alerts: ABI L 1.63 Electronic Signature(s) Signed: 02/29/2016 5:06:33 PM By: Elliot GurneyWoody, RN, BSN, Kim RN, BSN Entered By: Elliot GurneyWoody, RN, BSN, Kim on 02/28/2016 09:32:22 [name], [name] ([id number]) -------------------------------------------------------------------------------- Encounter Discharge Information Details Patient Name: [name], [name] Date of Service: 02/28/2016 9:30 AM Medical Record Number: [id number] Patient Account Number: [id number] Date of Birth/Sex: 09/30/1955 (59 y.o. Male) Treating RN: Huel CoventryWoody, Kim Primary Care Physician: Maurine MinisterMCLAUGHLIN, MIRIAM Other Clinician: Referring Physician: Maurine MinisterMCLAUGHLIN, MIRIAM Treating Physician/Extender: Rudene ReBritto, Errol Weeks in Treatment: 2 Encounter Discharge Information Items Discharge Pain Level: 0 Discharge  Condition: Stable Ambulatory Status: Ambulatory Discharge Destination: Home Transportation: Private Auto Accompanied By: intrepreter Schedule Follow-up Appointment: Yes Medication Reconciliation completed and provided to Patient/Care Yes Tysheem Accardo: Provided on Clinical Summary of Care: 02/28/2016 Form Type Recipient Paper Patient IAG Electronic Signature(s) Signed: 02/29/2016 5:06:33 PM By: Elliot GurneyWoody, RN, BSN, Kim RN, BSN Previous Signature: 02/28/2016 10:09:40 AM Version By: Gwenlyn PerkingMoore, Shelia Entered By: Elliot GurneyWoody, RN, BSN, Kim on 02/28/2016 12:10:41 [name], [name] ([id number]) -------------------------------------------------------------------------------- Lower Extremity Assessment Details Patient Name: [name], [name] Date of Service: 02/28/2016 9:30 AM Medical Record Number: [id number] Patient Account Number: [id number] Date of Birth/Sex: 09/30/1955 3(59 y.o. Male) Treating RN: Huel CoventryWoody, Kim Primary Care Physician: Maurine MinisterMCLAUGHLIN, MIRIAM Other Clinician: Referring Physician: Maurine MinisterMCLAUGHLIN, MIRIAM Treating Physician/Extender: Rudene ReBritto, Errol Weeks in Treatment: 2 Edema Assessment Assessed: [Left: No] [Right: No] E[Left: dema] [Right: :] Calf Left: Right: Point of Measurement: 35 cm From Medial Instep 36 cm cm Ankle Left: Right: Point of Measurement: 11 cm From Medial Instep 23 cm cm Vascular Assessment Pulses: Posterior Tibial Dorsalis Pedis Palpable: [Left:Yes] Extremity colors, hair growth, and conditions: Extremity Color: [Left:Normal] Hair Growth on Extremity: [Left:Yes] Temperature of Extremity: [Left:Warm] Capillary Refill: [Left:< 3 seconds] Toe Nail Assessment Left: Right: Thick: No Discolored: No Deformed: No Improper Length and Hygiene: No Electronic Signature(s) Signed: 02/29/2016 5:06:33 PM By: Elliot GurneyWoody, RN, BSN, Kim RN, BSN Entered By: Elliot GurneyWoody, RN, BSN, Kim on 02/28/2016 09:44:03 [name], [name]  ([id number]) -------------------------------------------------------------------------------- Multi Wound Chart Details Patient Name: [name], [name] Date of Service: 02/28/2016 9:30 AM Medical Record Number: [id number] Patient Account Number: [id number] Date of Birth/Sex: 09/30/1955 [age] y.o. Male) Treating RN: Huel CoventryWoody, Kim Primary Care Physician: Maurine MinisterMCLAUGHLIN, MIRIAM Other Clinician: Referring Physician: Maurine MinisterMCLAUGHLIN, MIRIAM Treating Physician/Extender: Rudene ReBritto, Errol Weeks in Treatment: 2 Vital Signs Height(in): 71 Pulse(bpm): 70 Weight(lbs): 171 Blood Pressure 102/72 (mmHg): Body Mass Index(BMI): 24 Temperature(F): 97.8 Respiratory Rate 18 (breaths/min): Photos: [N/A:N/A] Wound Location: Left Lower Leg - Medial N/A N/A Wounding Event: Gradually Appeared N/A N/A Primary Etiology: Venous Leg Ulcer N/A N/A Comorbid History: Neuropathy  N/A N/A Date Acquired: 12/18/2015 N/A N/A Weeks of Treatment: 2 N/A N/A Wound Status: Open N/A N/A Measurements L x W x D 1x1x0.2 N/A N/A (cm) Area (cm) : 0.785 N/A N/A Volume (cm) : 0.157 N/A N/A % Reduction in Area: 77.40% N/A N/A % Reduction in Volume: 77.40% N/A N/A Classification: Full Thickness Without N/A N/A Exposed Support Structures Exudate Amount: Large N/A N/A Exudate Type: Serous N/A N/A Exudate Color: amber N/A N/A Wound Margin: Flat and Intact N/A N/A Granulation Amount: Small (1-33%) N/A N/A Granulation Quality: Red, Pink N/A N/A Necrotic Amount: Medium (34-66%) N/A N/A [name], [name] ([id number]) Exposed Structures: Fascia: No N/A N/A Fat: No Tendon: No Muscle: No Joint: No Bone: No Limited to Skin Breakdown Epithelialization: Small (1-33%) N/A N/A Periwound Skin Texture: Scarring: Yes N/A N/A Edema: No Excoriation: No Induration: No Callus: No Crepitus: No Fluctuance: No Friable: No Rash: No Periwound Skin Moist: Yes N/A N/A Moisture: Maceration: No Dry/Scaly: No Periwound Skin Color:  Erythema: Yes N/A N/A Atrophie Blanche: No Cyanosis: No Ecchymosis: No Hemosiderin Staining: No Mottled: No Pallor: No Rubor: No Erythema Location: Circumferential N/A N/A Temperature: No Abnormality N/A N/A Tenderness on Yes N/A N/A Palpation: Wound Preparation: Ulcer Cleansing: N/A N/A Rinsed/Irrigated with Saline, Other: soap and water Topical Anesthetic Applied: Other: lidocaine 4% Treatment Notes Electronic Signature(s) Signed: 02/29/2016 5:06:33 PM By: Elliot Gurney, RN, BSN, Kim RN, BSN Entered By: Elliot Gurney, RN, BSN, Kim on 02/28/2016 09:44:36 [name], [name] ([id number]) -------------------------------------------------------------------------------- Multi-Disciplinary Care Plan Details Patient Name: [name], [name] Date of Service: 02/28/2016 9:30 AM Medical Record Number: [id number] Patient Account Number: [id number] Date of Birth/Sex: 1956-01-21 (59 y.o. Male) Treating RN: Huel Coventry Primary Care Physician: Maurine Minister Other Clinician: Referring Physician: Maurine Minister Treating Physician/Extender: Rudene Re in Treatment: 2 Active Inactive Venous Leg Ulcer Nursing Diagnoses: Actual venous Insuffiency (use after diagnosis is confirmed) Goals: Patient/caregiver will verbalize understanding of disease process and disease management Date Initiated: 02/14/2016 Goal Status: Active Interventions: Provide education on venous insufficiency Notes: Wound/Skin Impairment Nursing Diagnoses: Impaired tissue integrity Goals: Patient/caregiver will verbalize understanding of skin care regimen Date Initiated: 02/14/2016 Goal Status: Active Ulcer/skin breakdown will have a volume reduction of 30% by week 4 Date Initiated: 02/14/2016 Goal Status: Active Ulcer/skin breakdown will have a volume reduction of 50% by week 8 Date Initiated: 02/14/2016 Goal Status: Active Ulcer/skin breakdown will have a volume reduction of 80% by week 12 Date Initiated:  02/14/2016 Goal Status: Active Ulcer/skin breakdown will heal within 14 weeks Date Initiated: 02/14/2016 Goal Status: Active [name], [name] ([id number]) Interventions: Assess patient/caregiver ability to obtain necessary supplies Assess patient/caregiver ability to perform ulcer/skin care regimen upon admission and as needed Assess ulceration(s) every visit Notes: Electronic Signature(s) Signed: 02/29/2016 5:06:33 PM By: Elliot Gurney, RN, BSN, Kim RN, BSN Entered By: Elliot Gurney, RN, BSN, Kim on 02/28/2016 09:44:27 [name], [name] ([id number]) -------------------------------------------------------------------------------- Pain Assessment Details Patient Name: [name], [name] Date of Service: 02/28/2016 9:30 AM Medical Record Number: [id number] Patient Account Number: [id number] Date of Birth/Sex: November 01, 1955 (60 y.o. Male) Treating RN: Huel Coventry Primary Care Physician: Maurine Minister Other Clinician: Referring Physician: Maurine Minister Treating Physician/Extender: Rudene Re in Treatment: 2 Active Problems Location of Pain Severity and Description of Pain Patient Has Paino Yes Site Locations Pain Location: Pain in Ulcers With Dressing Change: Yes Rate the pain. Current Pain Level: 3 Pain Management and Medication Current Pain Management: Electronic Signature(s) Signed: 02/29/2016 5:06:33 PM By: Elliot Gurney, RN, BSN, Kim RN, BSN Entered By: Elliot Gurney,  RN, BSN, Kim on 02/28/2016 09:33:35 [name], [name] ([id number]) -------------------------------------------------------------------------------- Patient/Caregiver Education Details Patient Name: [name], [name] Date of Service: 02/28/2016 9:30 AM Medical Record Number: [id number] Patient Account Number: [id number] Date of Birth/Gender: [birth year] (60 y.o. Male) Treating RN: Huel Coventry Primary Care Physician: Maurine Minister Other Clinician: Referring Physician: Maurine Minister Treating  Physician/Extender: Rudene Re in Treatment: 2 Education Assessment Education Provided To: Patient Education Topics Provided Venous: Controlling Swelling with Multilayered Compression Wraps, Other: if wrap slides call wound Handouts: care center to be rewrapped Methods: Demonstration, Explain/Verbal Responses: State content correctly Electronic Signature(s) Signed: 02/29/2016 5:06:33 PM By: Elliot Gurney, RN, BSN, Kim RN, BSN Entered By: Elliot Gurney, RN, BSN, Kim on 02/28/2016 12:11:18 [name], [name] ([id number]) -------------------------------------------------------------------------------- Wound Assessment Details Patient Name: [name], [name] Date of Service: 02/28/2016 9:30 AM Medical Record Number: [id number] Patient Account Number: [id number] Date of Birth/Sex: Jun 11, 1956 (60 y.o. Male) Treating RN: Huel Coventry Primary Care Physician: Maurine Minister Other Clinician: Referring Physician: Maurine Minister Treating Physician/Extender: Rudene Re in Treatment: 2 Wound Status Wound Number: 2 Primary Etiology: Venous Leg Ulcer Wound Location: Left Lower Leg - Medial Wound Status: Open Wounding Event: Gradually Appeared Comorbid History: Neuropathy Date Acquired: 12/18/2015 Weeks Of Treatment: 2 Clustered Wound: No Photos Wound Measurements Length: (cm) 1 Width: (cm) 1 Depth: (cm) 0.2 Area: (cm) 0.785 Volume: (cm) 0.157 % Reduction in Area: 77.4% % Reduction in Volume: 77.4% Epithelialization: Small (1-33%) Tunneling: No Undermining: No Wound Description Full Thickness Without Exposed Classification: Support Structures Wound Margin: Flat and Intact Exudate Large Amount: Exudate Type: Serous Exudate Color: amber Foul Odor After Cleansing: No Wound Bed Granulation Amount: Small (1-33%) Exposed Structure Granulation Quality: Red, Pink Fascia Exposed: No Necrotic Amount: Medium (34-66%) Fat Layer Exposed: No [name], [name]  ([id number]) Necrotic Quality: Adherent Slough Tendon Exposed: No Muscle Exposed: No Joint Exposed: No Bone Exposed: No Limited to Skin Breakdown Periwound Skin Texture Texture Color No Abnormalities Noted: No No Abnormalities Noted: No Callus: No Atrophie Blanche: No Crepitus: No Cyanosis: No Excoriation: No Ecchymosis: No Fluctuance: No Erythema: Yes Friable: No Erythema Location: Circumferential Induration: No Hemosiderin Staining: No Localized Edema: No Mottled: No Rash: No Pallor: No Scarring: Yes Rubor: No Moisture Temperature / Pain No Abnormalities Noted: No Temperature: No Abnormality Dry / Scaly: No Tenderness on Palpation: Yes Maceration: No Moist: Yes Wound Preparation Ulcer Cleansing: Rinsed/Irrigated with Saline, Other: soap and water, Topical Anesthetic Applied: Other: lidocaine 4%, Treatment Notes Wound #2 (Left, Medial Lower Leg) 1. Cleansed with: Cleanse wound with antibacterial soap and water 2. Anesthetic Topical Lidocaine 4% cream to wound bed prior to debridement 3. Peri-wound Care: Barrier cream 4. Dressing Applied: Aquacel Ag 5. Secondary Dressing Applied ABD Pad 7. Secured with 3 Layer Compression System - Left Lower Extremity Electronic Signature(s) Signed: 02/29/2016 5:06:33 PM By: Elliot Gurney, RN, BSN, Kim RN, BSN Entered By: Elliot Gurney, RN, BSN, Kim on 02/28/2016 09:42:21 [name], [name] ([id number]) [name], [name] ([id number]) -------------------------------------------------------------------------------- Vitals Details Patient Name: [name], [name] Date of Service: 02/28/2016 9:30 AM Medical Record Number: [id number] Patient Account Number: [id number] Date of Birth/Sex: [birth year] (60 y.o. Male) Treating RN: Huel Coventry Primary Care Physician: Maurine Minister Other Clinician: Referring Physician: Maurine Minister Treating Physician/Extender: Rudene Re in Treatment: 2 Vital Signs Time Taken:  09:33 Temperature (F): 97.8 Height (in): 71 Pulse (bpm): 70 Weight (lbs): 171 Respiratory Rate (breaths/min): 18 Body Mass Index (BMI): 23.8 Blood Pressure (mmHg): 102/72 Reference Range: 80 - 120 mg / dl Electronic Signature(s) Signed:  02/29/2016 5:06:33 PM By: Elliot GurneyWoody, RN, BSN, Kim RN, BSN Entered By: Elliot GurneyWoody, RN, BSN, Kim on 02/28/2016 09:36:04

## 2016-03-06 ENCOUNTER — Encounter: Payer: PRIVATE HEALTH INSURANCE | Admitting: Internal Medicine

## 2016-03-06 DIAGNOSIS — I87332 Chronic venous hypertension (idiopathic) with ulcer and inflammation of left lower extremity: Secondary | ICD-10-CM | POA: Diagnosis not present

## 2016-03-07 NOTE — Progress Notes (Signed)
[name], [name] ([id number]) Visit Report for 03/06/2016 Arrival Information Details [name], Date of Service: 03/06/2016 1:30 PM Patient Name: [name] Patient Account Number: [id number] Medical Record Treating RN: Phillis Haggis [id number] Number: Other Clinician: Date of Birth/Sex: 1955-11-03 (59 y.o. Male) Treating Leanord Hawking, MICHAEL Primary Care Physician/Extender: Elwanda Brooklyn, MIRIAM Physician: Referring Physician: Maurine Minister Weeks in Treatment: 3 Visit Information History Since Last Visit All ordered tests and consults were completed: No Patient Arrived: Ambulatory Added or deleted any medications: No Arrival Time: 13:37 Any new allergies or adverse reactions: No Accompanied By: translator Had a fall or experienced change in No Transfer Assistance: None activities of daily living that may affect Patient Identification Verified: Yes risk of falls: Secondary Verification Process Yes Signs or symptoms of abuse/neglect since last No Completed: visito Patient Requires Transmission-Based No Hospitalized since last visit: No Precautions: Pain Present Now: No Patient Has Alerts: Yes Patient Alerts: ABI L 1.63 Electronic Signature(s) Signed: 03/06/2016 5:34:21 PM By: Alejandro Mulling Entered By: Alejandro Mulling on 03/06/2016 13:38:24 [name], [name] ([id number]) -------------------------------------------------------------------------------- Clinic Level of Care Assessment Details [name], Date of Service: 03/06/2016 1:30 PM Patient Name: [name] Patient Account Number: [id number] Medical Record Treating RN: Phillis Haggis [id number] Number: Other Clinician: Date of Birth/Sex: 09/13/1955 (60 y.o. Male) Treating ROBSON, MICHAEL Primary Care Physician/Extender: Elwanda Brooklyn, MIRIAM Physician: Referring Physician: Maurine Minister Weeks in Treatment: 3 Clinic Level of Care Assessment Items TOOL 4 Quantity Score X - Use when only an EandM is  performed on FOLLOW-UP visit 1 0 ASSESSMENTS - Nursing Assessment / Reassessment X - Reassessment of Co-morbidities (includes updates in patient status) 1 10 X - Reassessment of Adherence to Treatment Plan 1 5 ASSESSMENTS - Wound and Skin Assessment / Reassessment X - Simple Wound Assessment / Reassessment - one wound 1 5 []  - Complex Wound Assessment / Reassessment - multiple wounds 0 []  - Dermatologic / Skin Assessment (not related to wound area) 0 ASSESSMENTS - Focused Assessment []  - Circumferential Edema Measurements - multi extremities 0 []  - Nutritional Assessment / Counseling / Intervention 0 []  - Lower Extremity Assessment (monofilament, tuning fork, pulses) 0 []  - Peripheral Arterial Disease Assessment (using hand held doppler) 0 ASSESSMENTS - Ostomy and/or Continence Assessment and Care []  - Incontinence Assessment and Management 0 []  - Ostomy Care Assessment and Management (repouching, etc.) 0 PROCESS - Coordination of Care X - Simple Patient / Family Education for ongoing care 1 15 []  - Complex (extensive) Patient / Family Education for ongoing care 0 []  - Staff obtains Consents, Records, Test Results / Process Orders 0 [name], [name] ([id number]) []  - Staff telephones HHA, Nursing Homes / Clarify orders / etc 0 []  - Routine Transfer to another Facility (non-emergent condition) 0 []  - Routine Hospital Admission (non-emergent condition) 0 []  - New Admissions / Manufacturing engineer / Ordering NPWT, Apligraf, etc. 0 []  - Emergency Hospital Admission (emergent condition) 0 []  - Simple Discharge Coordination 0 X - Complex (extensive) Discharge Coordination 1 15 PROCESS - Special Needs []  - Pediatric / Minor Patient Management 0 []  - Isolation Patient Management 0 []  - Hearing / Language / Visual special needs 0 []  - Assessment of Community assistance (transportation, D/C planning, etc.) 0 []  - Additional assistance / Altered mentation 0 []  - Support Surface(s)  Assessment (bed, cushion, seat, etc.) 0 INTERVENTIONS - Wound Cleansing / Measurement []  - Simple Wound Cleansing - one wound 0 []  - Complex Wound Cleansing - multiple wounds 0 X - Wound Imaging (photographs - any  number of wounds) 1 5 []  - Wound Tracing (instead of photographs) 0 []  - Simple Wound Measurement - one wound 0 []  - Complex Wound Measurement - multiple wounds 0 INTERVENTIONS - Wound Dressings []  - Small Wound Dressing one or multiple wounds 0 []  - Medium Wound Dressing one or multiple wounds 0 []  - Large Wound Dressing one or multiple wounds 0 []  - Application of Medications - topical 0 []  - Application of Medications - injection 0 [name], [name] ([id number]) INTERVENTIONS - Miscellaneous []  - External ear exam 0 []  - Specimen Collection (cultures, biopsies, blood, body fluids, etc.) 0 []  - Specimen(s) / Culture(s) sent or taken to Lab for analysis 0 []  - Patient Transfer (multiple staff / Nurse, adultHoyer Lift / Similar devices) 0 []  - Simple Staple / Suture removal (25 or less) 0 []  - Complex Staple / Suture removal (26 or more) 0 []  - Hypo / Hyperglycemic Management (close monitor of Blood Glucose) 0 []  - Ankle / Brachial Index (ABI) - do not check if billed separately 0 X - Vital Signs 1 5 Has the patient been seen at the hospital within the last three years: Yes Total Score: 60 Level Of Care: New/Established - Level 2 Electronic Signature(s) Signed: 03/06/2016 5:34:21 PM By: Alejandro MullingPinkerton, Debra Entered By: Alejandro MullingPinkerton, Debra on 03/06/2016 17:31:35 [name], [name] ([id number]) -------------------------------------------------------------------------------- Encounter Discharge Information Details Shaune PollackACUNA GUZMAN, Date of Service: 03/06/2016 1:30 PM Patient Name: [name] Patient Account Number: [id number] Medical Record Treating RN: Phillis Haggisinkerton, Debi [id number] Number: Other Clinician: Date of Birth/Sex: February 12, 1956 [age] y.o. Male) Treating ROBSON, MICHAEL Primary Care  Physician/Extender: Elwanda BrooklynG MCLAUGHLIN, MIRIAM Physician: Referring Physician: Maurine MinisterMCLAUGHLIN, MIRIAM Weeks in Treatment: 3 Encounter Discharge Information Items Discharge Pain Level: 0 Discharge Condition: Stable Ambulatory Status: Ambulatory Discharge Destination: Home Transportation: Private Auto translator from Accompanied By: hospital Schedule Follow-up Appointment: No Medication Reconciliation completed and provided to Patient/Care No Pearson Reasons: Provided on Clinical Summary of Care: 03/06/2016 Form Type Recipient Paper Patient IAG Electronic Signature(s) Signed: 03/06/2016 2:05:05 PM By: Gwenlyn PerkingMoore, Shelia Entered By: Gwenlyn PerkingMoore, Shelia on 03/06/2016 14:05:05 [name], [name] ([id number]) -------------------------------------------------------------------------------- Lower Extremity Assessment Details Shaune PollackACUNA GUZMAN, Date of Service: 03/06/2016 1:30 PM Patient Name: [name] Patient Account Number: [id number] Medical Record Treating RN: Phillis Haggisinkerton, Debi [id number] Number: Other Clinician: Date of Birth/Sex: February 12, 1956 (59 y.o. Male) Treating Leanord HawkingOBSON, MICHAEL Primary Care Physician/Extender: Elwanda BrooklynG MCLAUGHLIN, MIRIAM Physician: Referring Physician: Maurine MinisterMCLAUGHLIN, MIRIAM Weeks in Treatment: 3 Edema Assessment Assessed: [Left: No] [Right: No] E[Left: dema] [Right: :] Calf Left: Right: Point of Measurement: 35 cm From Medial Instep 35.5 cm cm Ankle Left: Right: Point of Measurement: 11 cm From Medial Instep 22.2 cm cm Vascular Assessment Pulses: Posterior Tibial Dorsalis Pedis Palpable: [Left:Yes] Extremity colors, hair growth, and conditions: Extremity Color: [Left:Hyperpigmented] Temperature of Extremity: [Left:Warm] Capillary Refill: [Left:< 3 seconds] Toe Nail Assessment Left: Right: Thick: Yes Discolored: Yes Deformed: No Improper Length and Hygiene: No Electronic Signature(s) Signed: 03/06/2016 5:34:21 PM By: Alejandro MullingPinkerton, Debra Entered By: Alejandro MullingPinkerton, Debra on 03/06/2016  13:44:50 [name], [name] ([id number]) [address][name], [name] ([id number]) -------------------------------------------------------------------------------- Multi Wound Chart Details Shaune PollackACUNA GUZMAN, Date of Service: 03/06/2016 1:30 PM Patient Name: [name] Patient Account Number: [id number] Medical Record Treating RN: Phillis Haggisinkerton, Debi [id number] Number: Other Clinician: Date of Birth/Sex: February 12, 1956 (59 y.o. Male) Treating ROBSON, MICHAEL Primary Care Physician/Extender: Elwanda BrooklynG MCLAUGHLIN, MIRIAM Physician: Referring Physician: Maurine MinisterMCLAUGHLIN, MIRIAM Weeks in Treatment: 3 Vital Signs Height(in): 71 Pulse(bpm): 75 Weight(lbs): 171 Blood Pressure 102/67 (mmHg): Body Mass Index(BMI): 24 Temperature(F): 98.4 Respiratory Rate 20 (breaths/min): Photos: [2:No Photos] [N/A:N/A]  Wound Location: [2:Left Lower Leg - Medial] [N/A:N/A] Wounding Event: [2:Gradually Appeared] [N/A:N/A] Primary Etiology: [2:Venous Leg Ulcer] [N/A:N/A] Comorbid History: [2:Neuropathy] [N/A:N/A] Date Acquired: [2:12/18/2015] [N/A:N/A] Weeks of Treatment: [2:3] [N/A:N/A] Wound Status: [2:Open] [N/A:N/A] Measurements L x W x D 0.1x0.1x0.1 [N/A:N/A] (cm) Area (cm) : [2:0.008] [N/A:N/A] Volume (cm) : [2:0.001] [N/A:N/A] % Reduction in Area: [2:99.80%] [N/A:N/A] % Reduction in Volume: 99.90% [N/A:N/A] Classification: [2:Full Thickness Without Exposed Support Structures] [N/A:N/A] Exudate Amount: [2:Medium] [N/A:N/A] Exudate Type: [2:Serosanguineous] [N/A:N/A] Exudate Color: [2:red, brown] [N/A:N/A] Wound Margin: [2:Flat and Intact] [N/A:N/A] Granulation Amount: [2:None Present (0%)] [N/A:N/A] Necrotic Amount: [2:Large (67-100%)] [N/A:N/A] Necrotic Tissue: [2:Eschar] [N/A:N/A] Exposed Structures: [2:Fascia: No Fat: No Tendon: No] [N/A:N/A] Muscle: No Joint: No Bone: No Limited to Skin Breakdown Epithelialization: Small (1-33%) N/A N/A Periwound Skin Texture: Scarring: Yes N/A N/A Edema: No Excoriation:  No Induration: No Callus: No Crepitus: No Fluctuance: No Friable: No Rash: No Periwound Skin Maceration: Yes N/A N/A Moisture: Dry/Scaly: Yes Moist: No Periwound Skin Color: Erythema: Yes N/A N/A Atrophie Blanche: No Cyanosis: No Ecchymosis: No Hemosiderin Staining: No Mottled: No Pallor: No Rubor: No Erythema Location: Circumferential N/A N/A Temperature: No Abnormality N/A N/A Tenderness on Yes N/A N/A Palpation: Wound Preparation: Ulcer Cleansing: N/A N/A Rinsed/Irrigated with Saline, Other: soap and water Topical Anesthetic Applied: Other: lidocaine 4% Treatment Notes Electronic Signature(s) Signed: 03/06/2016 5:34:21 PM By: Alejandro Mulling Entered By: Alejandro Mulling on 03/06/2016 13:50:02 [name], [name] ([id number]) -------------------------------------------------------------------------------- Multi-Disciplinary Care Plan Details [name], Date of Service: 03/06/2016 1:30 PM Patient Name: [name] Patient Account Number: [id number] Medical Record Treating RN: Phillis Haggis [id number] Number: Other Clinician: Date of Birth/Sex: 05/03/1956 (59 y.o. Male) Treating Baltazar Najjar Primary Care Physician/Extender: Elwanda Brooklyn, MIRIAM Physician: Referring Physician: Maurine Minister Weeks in Treatment: 3 Active Inactive Electronic Signature(s) Signed: 03/06/2016 5:34:21 PM By: Alejandro Mulling Entered By: Alejandro Mulling on 03/06/2016 14:14:48 [name], [name] ([id number]) -------------------------------------------------------------------------------- Pain Assessment Details [name], Date of Service: 03/06/2016 1:30 PM Patient Name: [name] Patient Account Number: [id number] Medical Record Treating RN: Phillis Haggis [id number] Number: Other Clinician: Date of Birth/Sex: 1956/01/22 (59 y.o. Male) Treating ROBSON, MICHAEL Primary Care Physician/Extender: Elwanda Brooklyn, MIRIAM Physician: Referring Physician: Maurine Minister Weeks  in Treatment: 3 Active Problems Location of Pain Severity and Description of Pain Patient Has Paino No Site Locations With Dressing Change: No Pain Management and Medication Current Pain Management: Electronic Signature(s) Signed: 03/06/2016 5:34:21 PM By: Alejandro Mulling Entered By: Alejandro Mulling on 03/06/2016 13:38:30 [name], [name] ([id number]) -------------------------------------------------------------------------------- Patient/Caregiver Education Details [name], Date of Service: 03/06/2016 1:30 PM Patient Name: [name] Patient Account Number: [id number] Medical Record Treating RN: Phillis Haggis [id number] Number: Other Clinician: Date of Birth/Gender: April 11, 1956 (59 y.o. Male) Treating Leanord Hawking, MICHAEL Primary Care Physician/Extender: Elwanda Brooklyn, MIRIAM Physician: Tania Ade in Treatment: 3 Referring Physician: Maurine Minister Education Assessment Education Provided To: Patient Education Topics Provided Wound/Skin Impairment: Other: Protect the wound area. Keep clean and dry. Please contact out office if you have any Handouts: questions. Methods: Demonstration, Explain/Verbal Responses: State content correctly Electronic Signature(s) Signed: 03/06/2016 5:34:21 PM By: Alejandro Mulling Entered By: Alejandro Mulling on 03/06/2016 13:59:46 [name], [name] ([id number]) -------------------------------------------------------------------------------- Wound Assessment Details [name], Date of Service: 03/06/2016 1:30 PM Patient Name: [name] Patient Account Number: [id number] Medical Record Treating RN: Phillis Haggis [id number] Number: Other Clinician: Date of Birth/Sex: 04/10/1956 (60 y.o. Male) Treating Leanord Hawking, MICHAEL Primary Care Physician/Extender: Elwanda Brooklyn, MIRIAM Physician: Referring Physician: Maurine Minister Weeks in Treatment: 3 Wound Status Wound Number: 2 Primary Etiology: Venous  Leg Ulcer Wound Location: Left Lower Leg -  Medial Wound Status: Open Wounding Event: Gradually Appeared Comorbid History: Neuropathy Date Acquired: 12/18/2015 Weeks Of Treatment: 3 Clustered Wound: No Photos Photo Uploaded By: Alejandro MullingPinkerton, Debra on 03/06/2016 14:45:37 Wound Measurements Length: (cm) 0 % Reduction in Width: (cm) 0 % Reduction in Depth: (cm) 0 Epithelializati Area: (cm) 0 Tunneling: Volume: (cm) 0 Undermining: Area: 100% Volume: 100% on: Large (67-100%) No No Wound Description Full Thickness Without Exposed Classification: Support Structures Wound Margin: Flat and Intact Exudate None Present Amount: Foul Odor After Cleansing: No Wound Bed Granulation Amount: None Present (0%) Exposed Structure Necrotic Amount: None Present (0%) Fascia Exposed: No [name], [name] ([id number]) Fat Layer Exposed: No Tendon Exposed: No Muscle Exposed: No Joint Exposed: No Bone Exposed: No Limited to Skin Breakdown Periwound Skin Texture Texture Color No Abnormalities Noted: No No Abnormalities Noted: No Callus: No Atrophie Blanche: No Crepitus: No Cyanosis: No Excoriation: No Ecchymosis: No Fluctuance: No Erythema: Yes Friable: No Erythema Location: Circumferential Induration: No Hemosiderin Staining: No Localized Edema: No Mottled: No Rash: No Pallor: No Scarring: Yes Rubor: No Moisture Temperature / Pain No Abnormalities Noted: No Temperature: No Abnormality Dry / Scaly: Yes Tenderness on Palpation: Yes Maceration: No Moist: No Wound Preparation Ulcer Cleansing: Rinsed/Irrigated with Saline, Other: soap and water, Topical Anesthetic Applied: None Electronic Signature(s) Signed: 03/06/2016 5:34:21 PM By: Alejandro MullingPinkerton, Debra Entered By: Alejandro MullingPinkerton, Debra on 03/06/2016 13:57:20 [name], [name] ([id number]) -------------------------------------------------------------------------------- Vitals Details Shaune PollackACUNA GUZMAN, Date of Service: 03/06/2016 1:30 PM Patient Name: [name] Patient  Account Number: [id number] Medical Record Treating RN: Phillis Haggisinkerton, Debi [id number] Number: Other Clinician: Date of Birth/Sex: [birth year] (59 y.o. Male) Treating ROBSON, MICHAEL Primary Care Physician/Extender: Elwanda BrooklynG MCLAUGHLIN, MIRIAM Physician: Referring Physician: Maurine MinisterMCLAUGHLIN, MIRIAM Weeks in Treatment: 3 Vital Signs Time Taken: 13:42 Temperature (F): 98.4 Height (in): 71 Pulse (bpm): 75 Weight (lbs): 171 Respiratory Rate (breaths/min): 20 Body Mass Index (BMI): 23.8 Blood Pressure (mmHg): 102/67 Reference Range: 80 - 120 mg / dl Electronic Signature(s) Signed: 03/06/2016 5:34:21 PM By: Alejandro MullingPinkerton, Debra Entered By: Alejandro MullingPinkerton, Debra on 03/06/2016 13:42:44

## 2016-03-07 NOTE — Progress Notes (Signed)
[name], [name] ([id number]) Visit Report for 03/06/2016 Chief Complaint Document Details Shaune PollackCUNA Bell, Date of Service: 03/06/2016 1:30 PM Patient Name: [name] Patient Account Number: [id number] Medical Record Treating RN: Phillis Haggisinkerton, Debi [id number] Number: Other Clinician: Date of Birth/Sex: [birth year] (60 y.o. Male) Treating Loye Reininger [facility] Physician/Extender: G Physician: Referring Physician: Maurine MinisterMCLAUGHLIN, MIRIAM Weeks in Treatment: 3 Information Obtained from: Patient Chief Complaint Chronic left calf/ankle ulcer. 02/14/16 patient is back in clinic with a wound in the same area of his left medial ankle Electronic Signature(s) Signed: 03/06/2016 5:34:52 PM By: Baltazar Najjarobson, Nadege Carriger MD Entered By: Baltazar Najjarobson, Alnita Aybar on 03/06/2016 14:24:27 [name], [name] ([id number]) -------------------------------------------------------------------------------- HPI Details Shaune PollackACUNA Bell, Date of Service: 03/06/2016 1:30 PM Patient Name: [name] Patient Account Number: [id number] Medical Record Treating RN: Phillis Haggisinkerton, Debi [id number] Number: Other Clinician: Date of Birth/Sex: [birth year] (59 y.o. Male) Treating Esti Demello Primary Care [facility] Physician/Extender: G Physician: Referring Physician: Maurine MinisterMCLAUGHLIN, MIRIAM Weeks in Treatment: 3 History of Present Illness HPI Description: Pleasant 60 year old with no significant past medical history. No history of diabetes or peripheral arterial disease. History obtained from interpreter. He says that he developed a spontaneous ulceration on his left medial ankle in July 2016. He thinks that it was secondary to a "ruptured vein". He denies any history of venous stasis ulcerations or of DVT. Ambulating per his baseline. 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 No claudication or rest pain. Underwent ultrasound on 04/18/2015 at [facility]. No DVT. No evidence for venous insufficiency. Foam  sclerotherapy recommended. Performing dressing changes with Medihoney and using Tubigrip for edema control. Declined compression bandages. He returns to clinic for follow-up and is without new complaints. No significant pain. No fever or chills. Minimal to no drainage. Declined debridement and compression bandage today. 02/14/16; this is a [age] year old Hispanic man who speaks minimal [place]. He is accompanied today by his daughter who speaks [place] as well as a Nurse, learning disabilitytranslator. 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 He was here last year in the fall cared for by Dr. Lawerance BachBurns., I was not involved his with his care and as far as I know none of our current physician staff was. In any case at that time he was apparently noncompliant with compression. He would not allow debridement. He did go to see vein and vascular who recommended that ablation of his varicosities however he apparently did not go through this. According to his daughter the wound actually healed but then reopened 2 months ago. The patient is a Financial controllerworker on a Radiographer, therapeuticfactory floor using steel toed work boots however he is currently off on 3 weeks vacation his insurance doesn't. He is not a known diabetic although his ABI in this clinic is 1.63. He is going for a primary care clinic appointment at the end of this month. Quit smoking 20 years ago. 02/21/2016 -- review of his vascular consult in October 2016 --venous duplex study showed no incompetence of the left greater small saphenous vein and no evidence of deep vein thrombosis or superficial thrombophlebitis. Dr. Wyn Quakerew was surprised at this result as he did have visible enlarged veins throughout the left calf. Clinical exam did confirm extensive varicosities and mild stasis dermatitis left lower extremity and scattered varicosities on the right. Dr. 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 Wyn Quakerew on 02/23/2016  for the new ulceration on the left medial ankle. He also had clinical signs of venous insufficiency and varicosities and he recommended repeating a duplex since [name] ([id number]) was a year since the last one, and foam sclerotherapy or endovenous ablation if there was significant saphenous vein reflux. 03/06/16; patient's area on his left medial ankle has healed. I removed surface eschar there is no open area, everything is epithelialized. He does have a circular area of chronic hemosiderin deposition. Large varicosities. He has been back to see Dr. dew at vascular surgery who is apparently proceeding with some form of sclerotherapy or ablation next month. Electronic Signature(s) Signed: 03/06/2016 5:34:52 PM By: Baltazar Najjarobson, Isaak Delmundo MD Entered By: Baltazar Najjarobson, Jamaal Bernasconi on 03/06/2016 14:30:19 [name], [name] ([id number]) -------------------------------------------------------------------------------- Physical Exam Details Shaune PollackACUNA Bell, Date of Service: 03/06/2016 1:30 PM Patient Name: [name] Patient Account Number: [id number] Medical Record Treating RN: Phillis Haggisinkerton, Debi [id number] Number: Other Clinician: Date of Birth/Sex: 14-Nov-1955 (59 y.o. Male) Treating Sibbie Flammia [facility]IR[facility] Physician/Extender: G Physician: Referring Physician: Maurine MinisterMCLAUGHLIN, MIRIAM Weeks in Treatment: 3 Constitutional Sitting or standing Blood Pressure is within target range for patient.. Pulse regular and within target range for patient.Marland Kitchen. Respirations regular, non-labored and within target range.. Temperature is normal and within the target range for the patient.. Patient appears well. Respiratory Respiratory effort is easy and symmetric bilaterally. Rate is normal at rest and on room air.. Cardiovascular Pedal pulses palpable and strong bilaterally.. Very prominent varicosities but no  uncontrolled edema. Lymphatic Nonpalpable in the popliteal or inguinal area. Integumentary (Hair, Skin) Other than the area of hemosiderin deposition around his wound in the left medial ankle I see no other skin issues.Marland Kitchen. Psychiatric No evidence of depression, anxiety, or agitation. Calm, cooperative, and communicative. Appropriate interactions and affect.. Notes Wound exam; after careful removal of the surface eschar there is no open area here. His area is totally epithelialized. There is surrounding hemosiderin deposition but no uncontrolled edema Electronic Signature(s) Signed: 03/06/2016 5:34:52 PM By: Baltazar Najjarobson, Dwane Andres MD Entered By: Baltazar Najjarobson, Skii Cleland on 03/06/2016 14:32:15 [name], [name] ([id number]) -------------------------------------------------------------------------------- Physician Orders Details Shaune PollackACUNA Bell, Date of Service: 03/06/2016 1:30 PM Patient Name: [name] Patient Account Number: [id number] Medical Record Treating RN: Phillis Haggisinkerton, Debi [id number] Number: Other Clinician: Date of Birth/Sex: 14-Nov-1955 (59 y.o. Male) Treating Emilygrace Grothe [facility] Care [facility] Physician/Extender: G Physician: Referring Physician: Maurine MinisterMCLAUGHLIN, MIRIAM Weeks in Treatment: 3 Verbal / Phone Orders: Yes ClinicianAshok Cordia: Pinkerton, Debi Read Back and Verified: Yes Diagnosis Coding Wound Cleansing o Cleanse wound with mild soap and water o May Shower, gently pat wound dry prior to applying new dressing. [facility] o Discharge from Wound Care Center - Protect the wound area. Keep clean and dry. Please contact out office if you have any questions or concerns. Electronic Signature(s) Signed: 03/06/2016 5:34:21 PM By: Alejandro MullingPinkerton, Debra Signed: 03/06/2016 5:34:52 PM By: Baltazar Najjarobson, Skylee Baird MD Entered By: Alejandro MullingPinkerton, Debra on 03/06/2016 13:59:10 [name], [name]  ([id number]) -------------------------------------------------------------------------------- Problem List Details Shaune PollackACUNA Bell, Date of Service: 03/06/2016 1:30 PM Patient Name: [name] Patient Account Number: [id number] Medical Record Treating RN: Phillis Haggisinkerton, Debi [id number] Number: Other Clinician: Date of Birth/Sex: 14-Nov-1955 (59 y.o. Male) Treating Kaileia Flow [facility] Care [facility] Physician/Extender: G Physician: Referring Physician: Maurine MinisterMCLAUGHLIN, MIRIAM Weeks in Treatment: 3 Active Problems ICD-10 Encounter Code Description Active Date Diagnosis L97.221 Non-pressure chronic ulcer of left calf limited to 02/14/2016 Yes breakdown of skin I87.332 Chronic venous hypertension (idiopathic) with ulcer and 02/14/2016 Yes inflammation of left lower extremity Inactive Problems  Resolved Problems Electronic Signature(s) Signed: 03/06/2016 5:34:52 PM By: Baltazar Najjar MD Entered By: Baltazar Najjar on 03/06/2016 14:23:01 [name], Cameron ([id number]) -------------------------------------------------------------------------------- Progress Note Details [name], Date of Service: 03/06/2016 1:30 PM Patient Name: Cameron Patient Account Number: [id number] Medical Record Treating RN: Phillis Haggis [id number] Number: Other Clinician: Date of Birth/Sex: 09-17-1955 (59 y.o. Male) Treating Samanda Buske Primary Care [facility], MIRIAM Physician/Extender: G Physician: Referring Physician: Maurine Minister Weeks in Treatment: 3 Subjective Chief Complaint Information obtained from Patient Chronic left calf/ankle ulcer. 02/14/16 patient is back in clinic with a wound in the same area of his left medial ankle History of Present Illness (HPI) Pleasant 60 year old with no significant past medical history. No history of diabetes or peripheral arterial disease. History obtained from interpreter. He says that he developed a spontaneous ulceration on his left medial ankle in  July 2016. He thinks that it was secondary to a "ruptured vein". He denies any history of venous stasis ulcerations or of DVT. Ambulating per his baseline. 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 No claudication or rest pain. Underwent ultrasound on 04/18/2015 at [facility]. No DVT. No evidence for venous insufficiency. Foam sclerotherapy recommended. Performing dressing changes with Medihoney and using Tubigrip for edema control. Declined compression bandages. He returns to clinic for follow-up and is without new complaints. No significant pain. No fever or chills. Minimal to no drainage. Declined debridement and compression bandage today. 02/14/16; this is a 60 year old Hispanic man who speaks minimal [place]. He is accompanied today by his daughter who speaks [place] as well as a Nurse, learning disability. 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 He was here last year in the fall cared for by Dr. Lawerance Bach., I was not involved his with his care and as far as I know none of our current physician staff was. In any case at that time he was apparently noncompliant with compression. He would not allow debridement. He did go to see vein and vascular who recommended that ablation of his varicosities however he apparently did not go through this. According to his daughter the wound actually healed but then reopened 2 months ago. The patient is a Financial controller on a Radiographer, therapeutic toed work boots however he is currently off on 3 weeks vacation his insurance doesn't. He is not a known diabetic although his ABI in this clinic is 1.63. He is going for a primary care clinic appointment at the end of this month. Quit smoking 20 years ago. 02/21/2016 -- review of his vascular consult in October 2016 --venous duplex study showed no incompetence of the left greater small saphenous vein and no evidence of deep vein thrombosis or superficial thrombophlebitis. Dr. Wyn Quaker was surprised at this  result as he did have visible enlarged veins [name], Cameron ([id number]) throughout the left calf. Clinical exam did confirm extensive varicosities and mild stasis dermatitis left lower extremity and scattered varicosities on the right. Dr. Wyn Quaker had recommended foam sclerotherapy to be performed for treatment of his varicose disease in the left lower extremity and had discussed this with the patient in October 2016 02/28/2016 -- reviewed by Dr. Wyn Quaker on 02/23/2016 for the new ulceration on the left medial ankle. He also had clinical signs of venous insufficiency and varicosities and he recommended repeating a duplex since it was a year since the last one, and foam sclerotherapy or endovenous ablation if there was significant saphenous vein reflux. 03/06/16; patient's area on his left medial ankle  has healed. I removed surface eschar there is no open area, everything is epithelialized. He does have a circular area of chronic hemosiderin deposition. Large varicosities. He has been back to see Dr. dew at vascular surgery who is apparently proceeding with some form of sclerotherapy or ablation next month. Objective Constitutional Sitting or standing Blood Pressure is within target range for patient.. Pulse regular and within target range for patient.Marland Kitchen Respirations regular, non-labored and within target range.. Temperature is normal and within the target range for the patient.. Patient appears well. Vitals Time Taken: 1:42 PM, Height: 71 in, Weight: 171 lbs, BMI: 23.8, Temperature: 98.4 F, Pulse: 75 bpm, Respiratory Rate: 20 breaths/min, Blood Pressure: 102/67 mmHg. Respiratory Respiratory effort is easy and symmetric bilaterally. Rate is normal at rest and on room air.. Cardiovascular Pedal pulses palpable and strong bilaterally.. Very prominent varicosities but no uncontrolled edema. Lymphatic Nonpalpable in the popliteal or inguinal area. Psychiatric No evidence of depression, anxiety,  or agitation. Calm, cooperative, and communicative. Appropriate interactions and affect.. General Notes: Wound exam; after careful removal of the surface eschar there is no open area here. His area is totally epithelialized. There is surrounding hemosiderin deposition but no uncontrolled edema Integumentary (Hair, Skin) Other than the area of hemosiderin deposition around his wound in the left medial ankle I see no other skin [name], Cameron ([id number]) issues.. Wound #2 status is Open. Original cause of wound was Gradually Appeared. The wound is located on the Left,Medial Lower Leg. The wound measures 0cm length x 0cm width x 0cm depth; 0cm^2 area and 0cm^3 volume. The wound is limited to skin breakdown. There is no tunneling or undermining noted. There is a none present amount of drainage noted. The wound margin is flat and intact. There is no granulation within the wound bed. There is no necrotic tissue within the wound bed. The periwound skin appearance exhibited: Scarring, Dry/Scaly, Erythema. The periwound skin appearance did not exhibit: Callus, Crepitus, Excoriation, Fluctuance, Friable, Induration, Localized Edema, Rash, Maceration, Moist, Atrophie Blanche, Cyanosis, Ecchymosis, Hemosiderin Staining, Mottled, Pallor, Rubor. The surrounding wound skin color is noted with erythema which is circumferential. Periwound temperature was noted as No Abnormality. The periwound has tenderness on palpation. Assessment Active Problems ICD-10 L97.221 - Non-pressure chronic ulcer of left calf limited to breakdown of skin I87.332 - Chronic venous hypertension (idiopathic) with ulcer and inflammation of left lower extremity Plan Wound Cleansing: Cleanse wound with mild soap and water May Shower, gently pat wound dry prior to applying new dressing. [facility]charge From [facility]: Discharge from Wound Care Center - Protect the wound area. Keep clean and dry. Please contact out office if you  have any questions or concerns. His wound is closed. I have suggested protedting this area especially when in work boots eg gauze. he is going for ablation next month. [name], Cameron ([id number]) can be discharged Electronic Signature(s) Signed: 03/06/2016 5:34:52 PM By: Baltazar Najjar MD Entered By: Baltazar Najjar on 03/06/2016 14:33:40 [name], Cameron ([id number]) -------------------------------------------------------------------------------- SuperBill Details [name], Date of Service: 03/06/2016 Patient Name: Cameron Patient Account Number: [id number] Medical Record Treating RN: Phillis Haggis [id number] Number: Other Clinician: Date of Birth/Sex: 1955/08/16 (59 y.o. Male) Treating Demontrez Rindfleisch Primary Care [facility], MIRIAM Physician/Extender: G Physician: Weeks in Treatment: 3 Referring Physician: Maurine Minister Diagnosis Coding ICD-10 Codes Code Description [phone] Non-pressure chronic ulcer of left calf limited to breakdown of skin Chronic venous hypertension (idiopathic) with ulcer and inflammation of left lower I87.332 extremity Facility Procedures CPT4 Code: 41324401 Description:  40981 - WOUND CARE VISIT-LEV 2 EST PT Modifier: Quantity: 1 Physician Procedures CPT4 Code Description: 1914782 99213 - WC PHYS LEVEL 3 - EST PT ICD-10 Description Diagnosis L97.221 Non-pressure chronic ulcer of left calf limited to Modifier: breakdown of sk Quantity: 1 in Electronic Signature(s) Signed: 03/06/2016 5:34:21 PM By: Alejandro Mulling Signed: 03/06/2016 5:34:52 PM By: Baltazar Najjar MD Entered By: Alejandro Mulling on 03/06/2016 17:31:45

## 2017-12-06 DIAGNOSIS — L97921 Non-pressure chronic ulcer of unspecified part of left lower leg limited to breakdown of skin: Secondary | ICD-10-CM | POA: Diagnosis not present

## 2017-12-06 DIAGNOSIS — L97329 Non-pressure chronic ulcer of left ankle with unspecified severity: Secondary | ICD-10-CM | POA: Diagnosis not present

## 2017-12-06 DIAGNOSIS — Z87891 Personal history of nicotine dependence: Secondary | ICD-10-CM | POA: Diagnosis not present

## 2017-12-06 DIAGNOSIS — M7989 Other specified soft tissue disorders: Secondary | ICD-10-CM | POA: Diagnosis not present

## 2017-12-06 DIAGNOSIS — I872 Venous insufficiency (chronic) (peripheral): Secondary | ICD-10-CM | POA: Diagnosis not present

## 2018-01-13 ENCOUNTER — Emergency Department
Admission: EM | Admit: 2018-01-13 | Discharge: 2018-01-13 | Disposition: A | Discharge status: Home / Self Care | Payer: BLUE CROSS/BLUE SHIELD | Attending: Emergency Medicine | Admitting: Emergency Medicine

## 2018-01-13 ENCOUNTER — Encounter: Payer: Self-pay | Admitting: Emergency Medicine

## 2018-01-13 ENCOUNTER — Emergency Department: Payer: BLUE CROSS/BLUE SHIELD

## 2018-01-13 DIAGNOSIS — I83028 Varicose veins of left lower extremity with ulcer other part of lower leg: Secondary | ICD-10-CM

## 2018-01-13 DIAGNOSIS — M25572 Pain in left ankle and joints of left foot: Secondary | ICD-10-CM | POA: Diagnosis not present

## 2018-01-13 DIAGNOSIS — I83029 Varicose veins of left lower extremity with ulcer of unspecified site: Secondary | ICD-10-CM | POA: Insufficient documentation

## 2018-01-13 DIAGNOSIS — L97829 Non-pressure chronic ulcer of other part of left lower leg with unspecified severity: Secondary | ICD-10-CM | POA: Diagnosis not present

## 2018-01-13 DIAGNOSIS — M7989 Other specified soft tissue disorders: Secondary | ICD-10-CM | POA: Diagnosis not present

## 2018-01-13 DIAGNOSIS — I83228 Varicose veins of left lower extremity with both ulcer of other part of lower extremity and inflammation: Secondary | ICD-10-CM | POA: Diagnosis not present

## 2018-01-13 LAB — CBC
HCT: 43.9 % (ref 40.0–52.0)
HEMOGLOBIN: 14.4 g/dL (ref 13.0–18.0)
MCH: 26.9 pg (ref 26.0–34.0)
MCHC: 32.8 g/dL (ref 32.0–36.0)
MCV: 81.8 fL (ref 80.0–100.0)
Platelets: 294 10*3/uL (ref 150–440)
RBC: 5.37 MIL/uL (ref 4.40–5.90)
RDW: 13.9 % (ref 11.5–14.5)
WBC: 6 10*3/uL (ref 3.8–10.6)

## 2018-01-13 LAB — COMPREHENSIVE METABOLIC PANEL
ALK PHOS: 81 U/L (ref 38–126)
ALT: 18 U/L (ref 0–44)
ANION GAP: 7 (ref 5–15)
AST: 22 U/L (ref 15–41)
Albumin: 3.9 g/dL (ref 3.5–5.0)
BUN: 8 mg/dL (ref 6–20)
CO2: 30 mmol/L (ref 22–32)
CREATININE: 0.56 mg/dL — AB (ref 0.61–1.24)
Calcium: 8.6 mg/dL — ABNORMAL LOW (ref 8.9–10.3)
Chloride: 101 mmol/L (ref 98–111)
Glucose, Bld: 117 mg/dL — ABNORMAL HIGH (ref 70–99)
Potassium: 3.6 mmol/L (ref 3.5–5.1)
SODIUM: 138 mmol/L (ref 135–145)
Total Bilirubin: 0.7 mg/dL (ref 0.3–1.2)
Total Protein: 8.9 g/dL — ABNORMAL HIGH (ref 6.5–8.1)

## 2018-01-13 MED ORDER — SULFAMETHOXAZOLE-TRIMETHOPRIM 800-160 MG PO TABS: 1.0000 | ORAL_TABLET | Freq: Two times a day (BID) | ORAL | 0 refills | Status: DC

## 2018-01-13 NOTE — ED Triage Notes (Signed)
Pt arrived with complaints of left foot pain. Pt had reddened-open ulcer to left ankle. Pt's pulses present on both feet

## 2018-01-13 NOTE — ED Provider Notes (Signed)
[facility] Emergency Department Provider Note   ____________________________________________    I have reviewed the triage vital signs and the nursing notes.   HISTORY  Chief Complaint Ankle Pain  Interpreter used   HPI [name] is a 62 y.o. male who presents with complaints of pain and redness in his left medial distal lower leg.  Patient is apparently had an ulceration there for some time.  Apparently it did improve for a brief period of time a year ago but is gotten worse more recently.  He describes working 12-hour days and the pain has worsened and has redness extending approximately from the ulcer.  He does not know that he is diabetic.  Denies fevers or chills.  [facility] but he does not know details.  Review of medical records demonstrates the patient has been seen multiple times in the ED for this ulceration with what appears to be repeat referrals to vascular surgery and wound care  History reviewed. No pertinent past medical history.  There are no active problems to display for this patient.   History reviewed. No pertinent surgical history.  Prior to Admission medications   Medication Sig Start Date End Date Taking? Authorizing Provider  sulfamethoxazole-trimethoprim (BACTRIM DS,SEPTRA DS) 800-160 MG tablet Take 1 tablet by mouth 2 (two) times daily. 01/13/18   Jene EveryKinner, Deserai Cansler, MD     Allergies Patient has no known allergies.  No family history on file.  Social History Social History   Tobacco Use  . Smoking status: Never Smoker  Substance Use Topics  . Alcohol use: No  . Drug use: Not on file    Review of Systems  Constitutional: No fever/chills Eyes: No discharge ENT: No sore throat. Cardiovascular: No palpitation Respiratory: No cough Gastrointestinal: No abdominal pain.   Genitourinary: Negative for dysuria. Musculoskeletal: Lower leg pain as above Skin: As above Neurological:  Negative for headaches or weakness   ____________________________________________   PHYSICAL EXAM:  VITAL SIGNS: ED Triage Vitals  Enc Vitals Group     BP 01/13/18 0818 112/74     Pulse Rate 01/13/18 0818 74     Resp 01/13/18 0818 18     Temp 01/13/18 0818 98.8 F (37.1 C)     Temp Source 01/13/18 0818 Oral     SpO2 01/13/18 0818 100 %     Weight 01/13/18 0816 80.7 kg (178 lb)     Height 01/13/18 0816 1.778 m (5\' 10" )     Head Circumference --      Peak Flow --      Pain Score 01/13/18 0816 8     Pain Loc --      Pain Edu? --      Excl. in GC? --     Constitutional: Alert and oriented.  Pleasant and interactive Eyes: Conjunctivae are normal.   Nose: No congestion/rhinnorhea. Mouth/Throat: Mucous membranes are moist.    Cardiovascular: Normal rate, regular rhythm. Grossly normal heart sounds.  Good peripheral circulation. Respiratory: Normal respiratory effort.  No retractions. Lungs CTAB. Gastrointestinal: Soft and nontender. No distention.    Musculoskeletal: Left medial distal lower leg, approximately 10 cm x 15 cm oval-shaped ulceration with some erythema proximal to the wound.  No significant discharge.  1+ distal pulses Neurologic:  Normal speech and language. No gross focal neurologic deficits are appreciated.  Skin:  Skin is warm, dry, see above Psychiatric: Mood and affect are normal. Speech and behavior are normal.  ____________________________________________   LABS (all labs ordered are listed, but only abnormal results are displayed)  Labs Reviewed  COMPREHENSIVE METABOLIC PANEL - Abnormal; Notable for the following components:      Result Value   Glucose, Bld 117 (*)    Creatinine, Ser 0.56 (*)    Calcium 8.6 (*)    Total Protein 8.9 (*)    All other components within normal limits  CBC   ____________________________________________  EKG None ____________________________________________  RADIOLOGY  X-ray distal  foot ____________________________________________   PROCEDURES  Procedure(s) performed: No  Procedures   Critical Care performed: No ____________________________________________   INITIAL IMPRESSION / ASSESSMENT AND PLAN / ED COURSE  Pertinent labs & imaging results that were available during my care of the patient were reviewed by me and considered in my medical decision making (see chart for details).  Exam is consistent with stasis ulcer, the patient does have multiple varicose veins on the left lower leg.  There may be a mild infection, will check labs, x-ray but emphasized to the patient that vascular evaluation and wound care is critical    ____________________________________________   FINAL CLINICAL IMPRESSION(S) / ED DIAGNOSES  Final diagnoses:  Venous stasis ulcer of other part of left lower leg with varicose veins, unspecified ulcer stage (HCC)        Note:  This document was prepared using Dragon voice recognition software and may include unintentional dictation errors.    Jene Every, MD 01/13/18 [phone]

## 2018-01-23 ENCOUNTER — Encounter (INDEPENDENT_AMBULATORY_CARE_PROVIDER_SITE_OTHER): Payer: Self-pay | Admitting: Vascular Surgery

## 2018-01-23 ENCOUNTER — Ambulatory Visit (INDEPENDENT_AMBULATORY_CARE_PROVIDER_SITE_OTHER): Payer: BLUE CROSS/BLUE SHIELD | Admitting: Vascular Surgery

## 2018-01-23 VITALS — BP 128/71 | HR 83 | Resp 14 | Ht 71.0 in | Wt 170.0 lb

## 2018-01-23 DIAGNOSIS — I83009 Varicose veins of unspecified lower extremity with ulcer of unspecified site: Secondary | ICD-10-CM | POA: Insufficient documentation

## 2018-01-23 DIAGNOSIS — M7989 Other specified soft tissue disorders: Secondary | ICD-10-CM

## 2018-01-23 DIAGNOSIS — L97322 Non-pressure chronic ulcer of left ankle with fat layer exposed: Secondary | ICD-10-CM

## 2018-01-23 DIAGNOSIS — I83023 Varicose veins of left lower extremity with ulcer of ankle: Secondary | ICD-10-CM | POA: Diagnosis not present

## 2018-01-23 DIAGNOSIS — L97909 Non-pressure chronic ulcer of unspecified part of unspecified lower leg with unspecified severity: Secondary | ICD-10-CM

## 2018-01-23 NOTE — Progress Notes (Signed)
Patient ID: [name], male   DOB: 05/05/1956, 62 y.o.   MRN: [id number]  Chief Complaint  Patient presents with  . New Patient (Initial Visit)    Left Leg pain and redness    HPI Cameron Acuna Maryan Rued is a 62 y.o. male.  I am asked to see the patient by Dr. Mable Paris in the ER for evaluation of leg leg ulceration, pain and redness.  The patient reports about a month of a nonhealing ulceration on the left medial ankle and lower leg.  He has marked stasis dermatitis changes on the left medial lower leg already.  He says there is no trauma, injury, or inciting event that caused the ulceration.  He stands on his feet for 12 hours at a time at work.  He denies any fevers or chills.  No nausea or vomiting.  He has had prominent varicose veins for many years more so on the left than the right but there is also varicose veins on the right.  He has some mild swelling as well as heaviness and pain in his legs bilaterally.  The ER referred him to [place] for further evaluation and treatment.   PMH Varicose veins  PSH None  Family History No bleeding disorders, clotting disorders, porphyria, autoimmune diseases, or aneurysms  Social History Social History   Tobacco Use  . Smoking status: Never Smoker  . Smokeless tobacco: Never Used  Substance Use Topics  . Alcohol use: No  . Drug use: Not on file     No Known Allergies  Current Outpatient Medications  Medication Sig Dispense Refill  . sulfamethoxazole-trimethoprim (BACTRIM DS,SEPTRA DS) 800-160 MG tablet Take 1 tablet by mouth 2 (two) times daily. (Patient not taking: Reported on 01/23/2018) 14 tablet 0   No current facility-administered medications for this visit.       REVIEW OF SYSTEMS (Negative unless checked)  Constitutional: [] Weight loss  [] Fever  [] Chills Cardiac: [] Chest pain   [] Chest pressure   [] Palpitations   [] Shortness of breath when laying flat   [] Shortness of breath at rest   [] Shortness of breath with  exertion. Vascular:  [x] Pain in legs with walking   [] Pain in legs at rest   [] Pain in legs when laying flat   [] Claudication   [x] Pain in feet when walking  [] Pain in feet at rest  [] Pain in feet when laying flat   [] History of DVT   [] Phlebitis   [x] Swelling in legs   [x] Varicose veins   [x] Non-healing ulcers Pulmonary:   [] Uses home oxygen   [] Productive cough   [] Hemoptysis   [] Wheeze  [] COPD   [] Asthma Neurologic:  [] Dizziness  [] Blackouts   [] Seizures   [] History of stroke   [] History of TIA  [] Aphasia   [] Temporary blindness   [] Dysphagia   [] Weakness or numbness in arms   [] Weakness or numbness in legs Musculoskeletal:  [] Arthritis   [] Joint swelling   [] Joint pain   [] Low back pain Hematologic:  [] Easy bruising  [] Easy bleeding   [] Hypercoagulable state   [] Anemic  [] Hepatitis Gastrointestinal:  [] Blood in stool   [] Vomiting blood  [] Gastroesophageal reflux/heartburn   [] Abdominal pain Genitourinary:  [] Chronic kidney disease   [] Difficult urination  [] Frequent urination  [] Burning with urination   [] Hematuria Skin:  [] Rashes   [x] Ulcers   [x] Wounds Psychological:  [] History of anxiety   []  History of major depression.    Physical Exam BP 128/71 (BP Location: Right Arm, Patient Position: Sitting)   Pulse 83  Resp 14   Ht 5' 11"  (1.803 m)   Wt 170 lb (77.1 kg)   BMI 23.71 kg/m  Gen:  WD/WN, NAD Head: [place]/AT, No temporalis wasting.  Ear/Nose/Throat: Hearing grossly intact, nares w/o erythema or drainage, oropharynx w/o Erythema/Exudate Eyes: Conjunctiva clear, sclera non-icteric  Neck: trachea midline.   Pulmonary:  Good air movement, respirations not labored, no use of accessory muscles Cardiac: RRR, no JVD Vascular:  Vessel Right Left  Radial Palpable Palpable                          PT Palpable  not palpable  DP Palpable Palpable   Gastrointestinal: soft, non-tender/non-distended.  Musculoskeletal: M/S 5/5 throughout.  Extremities without ischemic changes.  Marked  stasis dermatitis changes are present in the left lower leg.  A 5 or 6 cm shallow ulceration around the ankle and just above the ankle medially is present.  Extensive varicosities are present in the left lower extremity measuring as large as 4 mm.  Diffuse varicosities are also present in the right lower extremity.  Trace swelling in the right leg with 1-2+ swelling in the left leg Neurologic: Sensation grossly intact in extremities.  Symmetrical.  Speech is fluent. Motor exam as listed above. Psychiatric: Judgment intact, Mood & affect appropriate for pt's clinical situation. Dermatologic: Left leg ulceration as above  Radiology Dg Ankle 2 Views Left  Result Date: 01/13/2018 CLINICAL DATA:  Open sore medial side with redness. EXAM: LEFT ANKLE - 2 VIEW COMPARISON:  None. FINDINGS: Medial soft tissue swelling. No evidence of osteomyelitis. No traumatic finding. Regional arterial calcification is noted. Multiple venous phleboliths incidentally noted. No degenerative change of the ankle or visualized foot. IMPRESSION: Medial soft tissue swelling. Vascular calcification. No sign of osteomyelitis. Electronically Signed   By: Nelson Chimes M.D.   On: 01/13/2018 09:03    Labs Recent Results (from the past 2160 hour(s))  CBC     Status: None   Collection Time: 01/13/18  8:29 AM  Result Value Ref Range   WBC 6.0 3.8 - 10.6 K/uL   RBC 5.37 4.40 - 5.90 MIL/uL   Hemoglobin 14.4 13.0 - 18.0 g/dL   HCT 43.9 40.0 - 52.0 %   MCV 81.8 80.0 - 100.0 fL   MCH 26.9 26.0 - 34.0 pg   MCHC 32.8 32.0 - 36.0 g/dL   RDW 13.9 11.5 - 14.5 %   Platelets 294 150 - 440 K/uL    Comment: Performed at [facility], [address] [place], [place] [address]  Comprehensive metabolic panel     Status: Abnormal   Collection Time: 01/13/18  8:29 AM  Result Value Ref Range   Sodium 138 135 - 145 mmol/L   Potassium 3.6 3.5 - 5.1 mmol/L   Chloride 101 98 - 111 mmol/L    Comment: Please note change in reference range.     CO2 30 22 - 32 mmol/L   Glucose, Bld 117 (H) 70 - 99 mg/dL    Comment: Please note change in reference range.   BUN 8 6 - 20 mg/dL    Comment: Please note change in reference range.   Creatinine, Ser 0.56 (L) 0.61 - 1.24 mg/dL   Calcium 8.6 (L) 8.9 - 10.3 mg/dL   Total Protein 8.9 (H) 6.5 - 8.1 g/dL   Albumin 3.9 3.5 - 5.0 g/dL   AST 22 15 - 41 U/L   ALT 18 0 - 44 U/L  Comment: Please note change in reference range.   Alkaline Phosphatase 81 38 - 126 U/L   Total Bilirubin 0.7 0.3 - 1.2 mg/dL   GFR calc non Af Amer >60 >60 mL/min   GFR calc Af Amer >60 >60 mL/min    Comment: (NOTE) The eGFR has been calculated using the CKD EPI equation. This calculation has not been validated in all clinical situations. eGFR's persistently <60 mL/min signify possible Chronic Kidney Disease.    Anion gap 7 5 - 15    Comment: Performed at [facility], [address] [place], [place] [address]    Assessment/Plan:  Varicose veins of lower extremities with ulcer ([place]) The patient has a reasonably extensive venous stasis ulceration of the left lower extremity.  We have had a long discussion today about the pathophysiology and natural history of venous insufficiency.  I discussed the need to get him in an Unna boot to try to control the swelling and get the skin healed.  This will take several weeks.  I discussed the plan for venous evaluation with duplex and I will check both legs as he has some symptoms on the right leg as well.  This could be done in several weeks.  I discussed that venous intervention is likely going to be necessary to prevent recurrent ulceration or at least reduce that risk.  I have discussed this would likely be a staged process.  The patient voices his understanding and is agreeable with our plan of care.  Lower limb ulcer, ankle, left, with fat layer exposed ([place]) From venous disease.  Unna boots and venous work-up as above  Swelling of limb From venous disease.   In Unna boots and work-up as above      Leotis Pain 01/23/2018, 11:23 AM   This note was created with Dragon medical transcription system.  Any errors from dictation are unintentional.

## 2018-01-23 NOTE — Assessment & Plan Note (Signed)
From venous disease.  In Unna boots and work-up as above

## 2018-01-23 NOTE — Patient Instructions (Signed)
Estasis venosa o insuficiencia venosa crnica (Venous Stasis or Chronic Venous Insufficiency) La insuficiencia venosa crnica, tambin llamada estasis venosa, es una enfermedad que afecta las venas de las piernas. Esta afeccin evita el bombeo eficaz de la sangre a travs de las venas. La sangre ya no se bombea correctamente de las piernas al corazn. La enfermedad puede ser de leve a grave. Con un tratamiento adecuado, podr llevar una vida activa. CAUSAS La insuficiencia venosa crnica ocurre cuando las paredes de las venas se estiran, debilitan o daan, o cuando las vlvulas de las venas estn daadas. Algunas causas comunes incluyen:  Hipertensin arterial en las venas (hipertensin venosa).  Aumento de la tensin arterial en las venas de las piernas por pasar largos perodos sentado o de pie.  Un cogulo sanguneo que bloquea la circulacin en una vena (trombosis venosa profunda).  Una inflamacin de una vena superficial (flebitis) que forma un cogulo sanguneo . FACTORES DE RIESGO Hay diversos factores que aumentan las probabilidades de desarrollar insuficiencia venosa crnica, por ejemplo:  Antecedentes familiares de la enfermedad.  Obesidad.  Embarazo.  Estilo de vida sedentario.  Fumar.  Trabajos que requieren permanecer largos perodos de pie o sentado en un lugar.  Tener cierta edad. Las mujeres entre 40 y 50aos y los hombres de ms de 70 aos tienen una probabilidad mayor de desarrollar esta enfermedad. SIGNOS Y SNTOMAS Los sntomas pueden ser:  Venas varicosas  Laceracin o lceras en la piel.  Enrojecimiento o cambio de color en la piel de la pierna.  Piel amarronada, suave y tirante, y con dolor por arriba del tobillo, generalmente sobre la superficie interna (lipodermatosclerosis).  Hinchazn. DIAGNSTICO Para diagnosticar la enfermedad, el mdico le har una historia clnica y un examen fsico. Para confirmar el diagnstico, le indicarn las siguientes  pruebas:  Ecografa dplex: procedimiento que produce una imagen de los vasos sanguneos y los rganos cercanos, y adems proporciona informacin sobre el flujo sanguneo a travs de los vasos.  Pletismografa: procedimiento que estudia el flujo sanguneo.  Venograma o venografa: procedimiento utilizado para observar las venas mediante una radiografa y una sustancia de contraste. TRATAMIENTO Los objetivos del tratamiento es que la persona vuelva a tener una vida activa y minimizar el dolor o la discapacidad. El tratamiento depender de la gravedad de la enfermedad. Los procedimientos mdicos pueden necesitarse para casos graves. Las opciones de tratamiento son:  Uso de medias de compresin. Ayudan a aliviar los sntomas y a disminuir las probabilidades de que el problema empeore, pero no lo curan.  Escleroterapia, un procedimiento que implica la aplicacin de una inyeccin con una sustancia que "disuelve" las venas daadas. Otras venas toman la funcin de las venas daadas.  Ciruga para extraer la vena o cortar el flujo sanguneo a travs de la vena (extirpacin de la vena o ciruga de ablacin con lser).  Ciruga para reparar una vlvula. INSTRUCCIONES PARA EL CUIDADO EN EL HOGAR  Use medias de compresin como le haya indicado su mdico.  Utilice los medicamentos de venta libre o recetados para calmar el dolor, el malestar o la fiebre, segn se lo indique el mdico.  Concurra a las consultas de control con su mdico segn las indicaciones.  SOLICITE ATENCIN MDICA SI:  Tiene enrojecimiento, hinchazn o aumento del dolor en la zona afectada.  Observa una lnea roja que se extiende por arriba o por debajo de la zona afectada.  Tiene una laceracin o prdida de la piel en la zona afectada, aunque sea pequea.  Se   lesiona la zona afectada.  SOLICITE ATENCIN MDICA DE INMEDIATO SI:  Tiene una lesin y una herida abierta en la zona afectada.  El dolor es intenso y no mejora  con los medicamentos.  Tiene adormecimiento o debilidad de repente en el pie o el tobillo por debajo de la zona afectada, o tiene dificultad para mover el pie o el tobillo.  Tiene fiebre o sntomas persistentes durante ms de 2a 3das.  Tiene fiebre y los sntomas empeoran repentinamente.  ASEGRESE DE QUE:  Comprende estas instrucciones.  Controlar su afeccin.  Recibir ayuda de inmediato si no mejora o si empeora.  Esta informacin no tiene como fin reemplazar el consejo del mdico. Asegrese de hacerle al mdico cualquier pregunta que tenga. Document Released: 10/17/2008 Document Revised: 04/21/2013 Document Reviewed: 03/08/2013 Elsevier Interactive Patient Education  2017 Elsevier Inc.   

## 2018-01-23 NOTE — Assessment & Plan Note (Signed)
From venous disease.  Unna boots and venous work-up as above

## 2018-01-23 NOTE — Assessment & Plan Note (Signed)
The patient has a reasonably extensive venous stasis ulceration of the left lower extremity.  We have had a long discussion today about the pathophysiology and natural history of venous insufficiency.  I discussed the need to get him in an Unna boot to try to control the swelling and get the skin healed.  This will take several weeks.  I discussed the plan for venous evaluation with duplex and I will check both legs as he has some symptoms on the right leg as well.  This could be done in several weeks.  I discussed that venous intervention is likely going to be necessary to prevent recurrent ulceration or at least reduce that risk.  I have discussed this would likely be a staged process.  The patient voices his understanding and is agreeable with our plan of care.

## 2018-01-30 ENCOUNTER — Encounter (INDEPENDENT_AMBULATORY_CARE_PROVIDER_SITE_OTHER): Payer: Self-pay

## 2018-01-30 ENCOUNTER — Ambulatory Visit (INDEPENDENT_AMBULATORY_CARE_PROVIDER_SITE_OTHER): Payer: BLUE CROSS/BLUE SHIELD | Admitting: Vascular Surgery

## 2018-01-30 VITALS — BP 122/75 | HR 78 | Resp 16 | Ht 71.0 in | Wt 170.0 lb

## 2018-01-30 DIAGNOSIS — I83023 Varicose veins of left lower extremity with ulcer of ankle: Secondary | ICD-10-CM

## 2018-01-30 DIAGNOSIS — R600 Localized edema: Secondary | ICD-10-CM

## 2018-01-30 DIAGNOSIS — M7989 Other specified soft tissue disorders: Secondary | ICD-10-CM

## 2018-01-30 DIAGNOSIS — L97322 Non-pressure chronic ulcer of left ankle with fat layer exposed: Secondary | ICD-10-CM

## 2018-01-30 NOTE — Progress Notes (Signed)
History of Present Illness  There is no documented history at this time  Assessments & Plan   There are no diagnoses linked to this encounter.    Additional instructions  Subjective:  Patient presents with venous ulcer of the Bilateral lower extremity.    Procedure:  3 layer unna wrap was placed Bilateral lower extremity.   Plan:   Follow up in one week.  

## 2018-02-06 ENCOUNTER — Ambulatory Visit (INDEPENDENT_AMBULATORY_CARE_PROVIDER_SITE_OTHER): Payer: BLUE CROSS/BLUE SHIELD | Admitting: Vascular Surgery

## 2018-02-06 ENCOUNTER — Encounter (INDEPENDENT_AMBULATORY_CARE_PROVIDER_SITE_OTHER): Payer: Self-pay | Admitting: Vascular Surgery

## 2018-02-06 VITALS — BP 109/63 | HR 77 | Resp 14 | Ht 70.0 in | Wt 178.0 lb

## 2018-02-06 DIAGNOSIS — I83023 Varicose veins of left lower extremity with ulcer of ankle: Secondary | ICD-10-CM

## 2018-02-06 DIAGNOSIS — L97322 Non-pressure chronic ulcer of left ankle with fat layer exposed: Secondary | ICD-10-CM | POA: Diagnosis not present

## 2018-02-06 DIAGNOSIS — M7989 Other specified soft tissue disorders: Secondary | ICD-10-CM

## 2018-02-06 NOTE — Progress Notes (Signed)
History of Present Illness  There is no documented history at this time  Assessments & Plan   There are no diagnoses linked to this encounter.    Additional instructions  Subjective:  Patient presents with venous ulcer of the Left lower extremity.    Procedure:  3 layer unna wrap was placed Left lower extremity.   Plan:   Follow up in one week.  

## 2018-02-13 ENCOUNTER — Encounter (INDEPENDENT_AMBULATORY_CARE_PROVIDER_SITE_OTHER): Payer: Self-pay

## 2018-02-13 ENCOUNTER — Ambulatory Visit (INDEPENDENT_AMBULATORY_CARE_PROVIDER_SITE_OTHER): Payer: BLUE CROSS/BLUE SHIELD | Admitting: Vascular Surgery

## 2018-02-13 VITALS — BP 101/60 | HR 72 | Resp 13 | Ht 71.0 in | Wt 172.0 lb

## 2018-02-13 DIAGNOSIS — I83023 Varicose veins of left lower extremity with ulcer of ankle: Secondary | ICD-10-CM | POA: Diagnosis not present

## 2018-02-13 DIAGNOSIS — M7989 Other specified soft tissue disorders: Secondary | ICD-10-CM

## 2018-02-13 DIAGNOSIS — L97322 Non-pressure chronic ulcer of left ankle with fat layer exposed: Secondary | ICD-10-CM

## 2018-02-13 MED ORDER — CEPHALEXIN 500 MG PO CAPS: 500.0000 mg | ORAL_CAPSULE | Freq: Four times a day (QID) | ORAL | 0 refills | Status: DC

## 2018-02-13 NOTE — Progress Notes (Signed)
Subjective:    Patient ID: [name], male    DOB: March 26, 1956, 62 y.o.   MRN: [id number] Chief Complaint  Patient presents with  . Follow-up    Unna boot check   Patient presents for a monthly Unna boot therapy follow-up.  Interpreter was used.  The patient notes that he was cutting off the Unna wrap the same day that it was put on before he went to bed.  He was not wearing the left lower extremity 3 layer zinc oxide Unna wrap 24/7 for a week as prescribed by Dr. Wyn Quakerew for 1 month.  He would cut it off every week that night it was put on.  The patient did not wear any type of compression when he took off the boot.  The patient presents today still complaining of left lower extremity pain, no healing to his medial ankle ulceration and swelling.  She notes that after working for 12 hours his left lower extremity hurts.  The patient denies any worsening of the ulceration.  The patient denies any worsening of the edema.  The patient denies any fever, nausea vomiting.  Review of Systems  Constitutional: Negative.   HENT: Negative.   Eyes: Negative.   Respiratory: Negative.   Cardiovascular: Positive for leg swelling.  Gastrointestinal: Negative.   Endocrine: Negative.   Genitourinary: Negative.   Musculoskeletal: Negative.   Skin: Positive for wound.  Allergic/Immunologic: Negative.   Neurological: Negative.   Hematological: Negative.   Psychiatric/Behavioral: Negative.       Objective:   Physical Exam  Constitutional: He is oriented to person, place, and time. He appears well-developed and well-nourished. No distress.  HENT:  Head: Normocephalic and atraumatic.  Right Ear: External ear normal.  Left Ear: External ear normal.  Eyes: Pupils are equal, round, and reactive to light. Conjunctivae and EOM are normal.  Neck: Normal range of motion.  Cardiovascular: Normal rate, regular rhythm and normal heart sounds.  Pulmonary/Chest: Effort normal and breath sounds normal.    Musculoskeletal: Normal range of motion. He exhibits edema (Mild right lower extremity edema.  Moderate left lower extremity edema.).  Neurological: He is alert and oriented to person, place, and time.  Skin: He is not diaphoretic.  Left medial ankle: Cellulitic with ulcer noninfected noted.  The patient has severe stasis dermatitis to the left calf.  There is no fibrosis  Psychiatric: He has a normal mood and affect. His behavior is normal. Judgment and thought content normal.  Vitals reviewed.  BP 101/60 (BP Location: Left Arm, Patient Position: Sitting)   Pulse 72   Resp 13   Ht 5\' 11"  (1.803 m)   Wt 172 lb (78 kg)   BMI 23.99 kg/m   History reviewed. No pertinent past medical history.  Social History   Socioeconomic History  . Marital status: Married    Spouse name: Not on file  . Number of children: Not on file  . Years of education: Not on file  . Highest education level: Not on file  Occupational History  . Not on file  Social Needs  . Financial resource strain: Not on file  . Food insecurity:    Worry: Not on file    Inability: Not on file  . Transportation needs:    Medical: Not on file    Non-medical: Not on file  Tobacco Use  . Smoking status: Never Smoker  . Smokeless tobacco: Never Used  Substance and Sexual Activity  . Alcohol use: No  .  Drug use: Not on file  . Sexual activity: Not on file  Lifestyle  . Physical activity:    Days per week: Not on file    Minutes per session: Not on file  . Stress: Not on file  Relationships  . Social connections:    Talks on phone: Not on file    Gets together: Not on file    Attends religious service: Not on file    Active member of club or organization: Not on file    Attends meetings of clubs or organizations: Not on file    Relationship status: Not on file  . Intimate partner violence:    Fear of current or ex partner: Not on file    Emotionally abused: Not on file    Physically abused: Not on file     Forced sexual activity: Not on file  Other Topics Concern  . Not on file  Social History Narrative  . Not on file   History reviewed. No pertinent surgical history.  History reviewed. No pertinent family history.  No Known Allergies     Assessment & Plan:  Patient presents for a monthly Unna boot therapy follow-up.  Interpreter was used.  The patient notes that he was cutting off the Unna wrap the same day that it was put on before he went to bed.  He was not wearing the left lower extremity 3 layer zinc oxide Unna wrap 24/7 for a week as prescribed by Dr. Wyn Quaker for 1 month.  He would cut it off every week that night it was put on.  The patient did not wear any type of compression when he took off the boot.  The patient presents today still complaining of left lower extremity pain, no healing to his medial ankle ulceration and swelling.  She notes that after working for 12 hours his left lower extremity hurts.  The patient denies any worsening of the ulceration.  The patient denies any worsening of the edema.  The patient denies any fever, nausea vomiting.  1. Varicose veins of left lower extremity with ulcer of ankle with fat layer exposed ([facility]) - Stable Unfortunately the patient was cutting off the Unna wraps and not keeping them on 24 7 for a week at a time. Recommend another month of 3 layer zinc oxide Unna wraps to the left lower extremity. Keflex 500 mg 1 tab every 6 hours x10 days for cellulitis Patient is to elevate his leg is much as possible Patient is to take an anti-inflammatory like ibuprofen or Aleve for any discomfort Patient will follow-up in 1 month so I can assess his progress Moved his ABI and bilateral lower extremity venous duplex up because it was originally scheduled for end of January 2020.  2. Lower limb ulcer, ankle, left, with fat layer exposed ([facility]) - Stable As above  3. Swelling of limb - Stable As above  No current outpatient medications on file prior to  visit.   No current facility-administered medications on file prior to visit.    There are no Patient Instructions on file for this visit. No follow-ups on file.  Gurvir Schrom A Sequoya Hogsett, PA-C

## 2018-02-20 ENCOUNTER — Ambulatory Visit (INDEPENDENT_AMBULATORY_CARE_PROVIDER_SITE_OTHER): Payer: PRIVATE HEALTH INSURANCE | Admitting: Vascular Surgery

## 2018-02-20 ENCOUNTER — Ambulatory Visit (INDEPENDENT_AMBULATORY_CARE_PROVIDER_SITE_OTHER): Payer: BLUE CROSS/BLUE SHIELD | Admitting: Nurse Practitioner

## 2018-02-20 ENCOUNTER — Encounter (INDEPENDENT_AMBULATORY_CARE_PROVIDER_SITE_OTHER): Payer: Self-pay

## 2018-02-20 VITALS — BP 120/71 | HR 82 | Resp 15 | Ht 72.0 in | Wt 176.0 lb

## 2018-02-20 DIAGNOSIS — L97322 Non-pressure chronic ulcer of left ankle with fat layer exposed: Secondary | ICD-10-CM | POA: Diagnosis not present

## 2018-02-20 NOTE — Progress Notes (Signed)
History of Present Illness  There is no documented history at this time  Assessments & Plan   There are no diagnoses linked to this encounter.    Additional instructions  Subjective:  Patient presents with venous ulcer of the Left lower extremity.    Procedure:  3 layer unna wrap was placed Left lower extremity.   Plan:   Follow up in one week.  

## 2018-02-27 ENCOUNTER — Ambulatory Visit (INDEPENDENT_AMBULATORY_CARE_PROVIDER_SITE_OTHER): Payer: BLUE CROSS/BLUE SHIELD | Admitting: Nurse Practitioner

## 2018-02-27 ENCOUNTER — Encounter (INDEPENDENT_AMBULATORY_CARE_PROVIDER_SITE_OTHER): Payer: Self-pay

## 2018-02-27 VITALS — BP 125/74 | HR 80 | Resp 15 | Ht 72.0 in | Wt 175.0 lb

## 2018-02-27 DIAGNOSIS — L97322 Non-pressure chronic ulcer of left ankle with fat layer exposed: Secondary | ICD-10-CM | POA: Diagnosis not present

## 2018-02-27 NOTE — Progress Notes (Signed)
History of Present Illness  There is no documented history at this time  Assessments & Plan   There are no diagnoses linked to this encounter.    Additional instructions  Subjective:  Patient presents with venous ulcer of the Left lower extremity.    Procedure:  3 layer unna wrap was placed Left lower extremity.   Plan:   Follow up in one week.  

## 2018-03-06 ENCOUNTER — Encounter (INDEPENDENT_AMBULATORY_CARE_PROVIDER_SITE_OTHER): Payer: Self-pay

## 2018-03-06 ENCOUNTER — Ambulatory Visit (INDEPENDENT_AMBULATORY_CARE_PROVIDER_SITE_OTHER): Payer: BLUE CROSS/BLUE SHIELD | Admitting: Nurse Practitioner

## 2018-03-06 VITALS — BP 106/61 | HR 76 | Resp 12 | Ht 70.0 in | Wt 174.0 lb

## 2018-03-06 DIAGNOSIS — L97322 Non-pressure chronic ulcer of left ankle with fat layer exposed: Secondary | ICD-10-CM | POA: Diagnosis not present

## 2018-03-06 NOTE — Progress Notes (Signed)
History of Present Illness  There is no documented history at this time  Assessments & Plan   There are no diagnoses linked to this encounter.    Additional instructions  Subjective:  Patient presents with venous ulcer of the Left lower extremity.    Procedure:  3 layer unna wrap was placed Left lower extremity.   Plan:   Follow up in one week.  

## 2018-03-13 ENCOUNTER — Encounter (INDEPENDENT_AMBULATORY_CARE_PROVIDER_SITE_OTHER): Payer: PRIVATE HEALTH INSURANCE

## 2018-03-13 ENCOUNTER — Ambulatory Visit (INDEPENDENT_AMBULATORY_CARE_PROVIDER_SITE_OTHER): Payer: BLUE CROSS/BLUE SHIELD | Admitting: Vascular Surgery

## 2018-03-13 ENCOUNTER — Encounter (INDEPENDENT_AMBULATORY_CARE_PROVIDER_SITE_OTHER): Payer: Self-pay | Admitting: Vascular Surgery

## 2018-03-13 VITALS — BP 116/66 | HR 69 | Resp 16 | Ht 70.0 in | Wt 175.8 lb

## 2018-03-13 DIAGNOSIS — I83023 Varicose veins of left lower extremity with ulcer of ankle: Secondary | ICD-10-CM | POA: Diagnosis not present

## 2018-03-13 DIAGNOSIS — L97322 Non-pressure chronic ulcer of left ankle with fat layer exposed: Secondary | ICD-10-CM

## 2018-03-13 DIAGNOSIS — M7989 Other specified soft tissue disorders: Secondary | ICD-10-CM | POA: Diagnosis not present

## 2018-03-13 NOTE — Progress Notes (Signed)
MRN : [id number]  [name] is a 62 y.o. ([birth year]) male who presents with chief complaint of  Chief Complaint  Patient presents with  . Follow-up    unna boot check  .  History of Present Illness: Patient returns today in follow up of venous disease and ulceration.  His ulceration on the left medial ankle has finally healed.  He still has a lot of dark discoloration and enlarged varicosities.  He has swelling, pain, and varicosities on the right leg as well.  PMH Varicose veins  PSH None  Family History No bleeding disorders, clotting disorders, porphyria, autoimmune diseases, or aneurysms  Social History Social History       Tobacco Use  . Smoking status: Never Smoker  . Smokeless tobacco: Never Used  Substance Use Topics  . Alcohol use: No  . Drug use: Not on file     No Known Allergies        Current Outpatient Medications  Medication Sig Dispense Refill  . sulfamethoxazole-trimethoprim (BACTRIM DS,SEPTRA DS) 800-160 MG tablet Take 1 tablet by mouth 2 (two) times daily. (Patient not taking: Reported on 01/23/2018) 14 tablet 0   No current facility-administered medications for this visit.       REVIEW OF SYSTEMS (Negative unless checked)  Constitutional: _0 Weight loss  _1 Fever  _2 Chills Cardiac: _3 Chest pain   _4 Chest pressure   _5 Palpitations   _6 Shortness of breath when laying flat   _7 Shortness of breath at rest   _8 Shortness of breath with exertion. Vascular:  _9 Pain in legs with walking   _10 Pain in legs at rest   _11 Pain in legs when laying flat   _12 Claudication   _13 Pain in feet when walking  _14 Pain in feet at rest  _15 Pain in feet when laying flat   _16 History of DVT   _17 Phlebitis   _18 Swelling in legs   _19 Varicose veins   _20 Non-healing ulcers Pulmonary:   _21 Uses home oxygen   _22 Productive cough   _23 Hemoptysis   _24 Wheeze  _25 COPD   _26 Asthma Neurologic:  _27 Dizziness  _28 Blackouts   _29 Seizures   _30 History of stroke   _31 History of  TIA  _32 Aphasia   _33 Temporary blindness   _34 Dysphagia   _35 Weakness or numbness in arms   _36 Weakness or numbness in legs Musculoskeletal:  _37 Arthritis   _38 Joint swelling   _39 Joint pain   _40 Low back pain Hematologic:  _41 Easy bruising  _42 Easy bleeding   _43 Hypercoagulable state   _44 Anemic  _45 Hepatitis Gastrointestinal:  _46 Blood in stool   _47 Vomiting blood  _48 Gastroesophageal reflux/heartburn   _49 Abdominal pain Genitourinary:  _50 Chronic kidney disease   _51 Difficult urination  _52 Frequent urination  _53 Burning with urination   _54 Hematuria Skin:  _55 Rashes   _56 Ulcers   _57 Wounds Psychological:  _58 History of anxiety   _59  History of major depression.    Physical Examination  BP 116/66 (BP Location: Right Arm)   Pulse 69   Resp 16   Ht _60  (1.778 m)   Wt 175 lb 12.8 oz (79.7 kg)   BMI 25.22 kg/m  Gen:  WD/WN, NAD Head: [place]/AT, No temporalis wasting. Ear/Nose/Throat: Hearing grossly intact, nares w/o erythema or drainage Eyes: Conjunctiva clear. Sclera non-icteric Neck: Supple.  Trachea midline Pulmonary:  Good air movement, no use of accessory muscles.  Cardiac: RRR, no JVD Vascular:  Vessel Right Left  Radial Palpable Palpable                          PT Palpable  Palpable  DP Palpable Palpable   Musculoskeletal: M/S 5/5 throughout.  No deformity or atrophy.  Trace right lower extremity edema, 1+ left lower extremity edema. Neurologic: Sensation grossly intact in extremities.  Symmetrical.  Speech is fluent.  Psychiatric: Judgment intact, Mood & affect appropriate for pt's clinical situation. Dermatologic: ARC stasis dermatitis changes on the left foot and ankle.  The previous left ankle ulceration has now healed       Labs Recent Results (from the past 2160 hour(s))  CBC     Status: None   Collection Time: 01/13/18  8:29 AM  Result Value Ref Range   WBC 6.0 3.8 - 10.6 K/uL   RBC 5.37 4.40 - 5.90 MIL/uL   Hemoglobin 14.4 13.0 - 18.0 g/dL   HCT 43.9 40.0 - 52.0 %   MCV  81.8 80.0 - 100.0 fL   MCH 26.9 26.0 - 34.0 pg   MCHC 32.8 32.0 - 36.0 g/dL   RDW 13.9 11.5 - 14.5 %   Platelets 294 150 - 440 K/uL    Comment: Performed at [facility], [address] [place], [place] [address]  Comprehensive metabolic panel     Status: Abnormal   Collection Time: 01/13/18  8:29 AM  Result Value Ref Range   Sodium 138 135 - 145 mmol/L   Potassium 3.6 3.5 - 5.1 mmol/L   Chloride 101 98 - 111 mmol/L    Comment: Please note change in reference range.   CO2 30 22 - 32 mmol/L   Glucose, Bld 117 (H) 70 - 99 mg/dL    Comment: Please note change in reference range.   BUN 8 6 - 20 mg/dL    Comment: Please note change in reference range.   Creatinine, Ser 0.56 (L) 0.61 - 1.24 mg/dL   Calcium 8.6 (L) 8.9 - 10.3 mg/dL   Total Protein 8.9 (H) 6.5 - 8.1 g/dL   Albumin 3.9 3.5 - 5.0 g/dL   AST 22 15 - 41 U/L   ALT 18 0 - 44 U/L    Comment: Please note change in reference range.   Alkaline Phosphatase 81 38 - 126 U/L   Total Bilirubin 0.7 0.3 - 1.2 mg/dL   GFR calc non Af Amer >60 >60 mL/min   GFR calc Af Amer >60 >60 mL/min    Comment: (NOTE) The eGFR has been calculated using the CKD EPI equation. This calculation has not been validated in all clinical situations. eGFR's persistently <60 mL/min signify possible Chronic Kidney Disease.    Anion gap 7 5 - 15    Comment: Performed at [facility], [address], [place], [place] [address]    Radiology No results found.  Assessment/Plan Lower limb ulcer, ankle, left, with fat layer exposed ([place]) From venous disease.  Unna boots and venous work-up as above  Swelling of limb From venous disease.  In Unna boots and work-up as above  Varicose veins of lower extremities with ulcer ([place]) The patient has symptomatic varicosities bilaterally worse on the left than the right.  We need to go ahead and get him a duplex study for further evaluation to determine what we can do to prevent recurrence and  improve his symptoms.  We will see him back with the study in the near future at his convenience.    Leotis Pain, MD  03/13/2018 10:35 AM    This note was created with Dragon medical transcription system.  Any errors from dictation are purely unintentional

## 2018-03-13 NOTE — Assessment & Plan Note (Signed)
The patient has symptomatic varicosities bilaterally worse on the left than the right.  We need to go ahead and get him a duplex study for further evaluation to determine what we can do to prevent recurrence and improve his symptoms.  We will see him back with the study in the near future at his convenience.

## 2018-03-17 ENCOUNTER — Encounter (INDEPENDENT_AMBULATORY_CARE_PROVIDER_SITE_OTHER): Payer: Self-pay | Admitting: Vascular Surgery

## 2018-03-17 ENCOUNTER — Ambulatory Visit (INDEPENDENT_AMBULATORY_CARE_PROVIDER_SITE_OTHER): Payer: PRIVATE HEALTH INSURANCE | Admitting: Vascular Surgery

## 2018-03-17 ENCOUNTER — Other Ambulatory Visit: Payer: Self-pay

## 2018-03-17 ENCOUNTER — Ambulatory Visit (INDEPENDENT_AMBULATORY_CARE_PROVIDER_SITE_OTHER): Payer: BLUE CROSS/BLUE SHIELD

## 2018-03-17 VITALS — BP 116/73 | HR 64 | Ht 70.0 in | Wt 175.0 lb

## 2018-03-17 DIAGNOSIS — I83023 Varicose veins of left lower extremity with ulcer of ankle: Secondary | ICD-10-CM

## 2018-03-17 DIAGNOSIS — L97321 Non-pressure chronic ulcer of left ankle limited to breakdown of skin: Secondary | ICD-10-CM | POA: Diagnosis not present

## 2018-03-17 DIAGNOSIS — L97322 Non-pressure chronic ulcer of left ankle with fat layer exposed: Secondary | ICD-10-CM | POA: Diagnosis not present

## 2018-03-17 DIAGNOSIS — M7989 Other specified soft tissue disorders: Secondary | ICD-10-CM

## 2018-03-17 NOTE — Progress Notes (Signed)
MRN : [id number]  [name] is a 62 y.o. ([birth year]) male who presents with chief complaint of  Chief Complaint  Patient presents with  . Varicose Veins  .  History of Present Illness: Patient returns today in follow up of venous disease.  His ulcer remains healed but his leg still has discoloration and swelling.  No drainage or erythema.  Venous reflux study today shows no evidence of DVT or superficial thrombophlebitis.  Reflux is identified in the common femoral vein but not in the saphenous vein system.  PMH Varicose veins  PSH None  Family History No bleeding disorders, clotting disorders, porphyria, autoimmune diseases, or aneurysms  Social History Social History       Tobacco Use  . Smoking status: Never Smoker  . Smokeless tobacco: Never Used  Substance Use Topics  . Alcohol use: No  . Drug use: Not on file     No Known Allergies        Current Outpatient Medications  Medication Sig Dispense Refill  . sulfamethoxazole-trimethoprim (BACTRIM DS,SEPTRA DS) 800-160 MG tablet Take 1 tablet by mouth 2 (two) times daily. (Patient not taking: Reported on 01/23/2018) 14 tablet 0   No current facility-administered medications for this visit.      REVIEW OF SYSTEMS(Negative unless checked)  Constitutional: [] Weight loss[] Fever[] Chills Cardiac:[] Chest pain[] Chest pressure[] Palpitations [] Shortness of breath when laying flat [] Shortness of breath at rest [] Shortness of breath with exertion. Vascular: [x] Pain in legs with walking[] Pain in legsat rest[] Pain in legs when laying flat [] Claudication [x] Pain in feet when walking [] Pain in feet at rest [] Pain in feet when laying flat [] History of DVT [] Phlebitis [x] Swelling in legs [x] Varicose veins [x] Non-healing ulcers Pulmonary: [] Uses home oxygen [] Productive cough[] Hemoptysis [] Wheeze [] COPD [] Asthma Neurologic: [] Dizziness  [] Blackouts [] Seizures [] History of stroke [] History of TIA[] Aphasia [] Temporary blindness[] Dysphagia [] Weaknessor numbness in arms [] Weakness or numbnessin legs Musculoskeletal: [] Arthritis [] Joint swelling [] Joint pain [] Low back pain Hematologic:[] Easy bruising[] Easy bleeding [] Hypercoagulable state [] Anemic [] Hepatitis Gastrointestinal:[] Blood in stool[] Vomiting blood[] Gastroesophageal reflux/heartburn[] Abdominal pain Genitourinary: [] Chronic kidney disease [] Difficulturination [] Frequenturination [] Burning with urination[] Hematuria Skin: [] Rashes [x] Ulcers [x] Wounds Psychological: [] History of anxiety[] History of major depression.    Physical Examination  BP 116/73 (BP Location: Right Arm)   Pulse 64   Ht 5' 10"  (1.778 m)   Wt 175 lb (79.4 kg)   BMI 25.11 kg/m  Gen:  WD/WN, NAD Head: [place]/AT, No temporalis wasting. Ear/Nose/Throat: Hearing grossly intact, nares w/o erythema or drainage Eyes: Conjunctiva clear. Sclera non-icteric Neck: Supple.  Trachea midline Pulmonary:  Good air movement, no use of accessory muscles.  Cardiac: RRR, no JVD Vascular: Large varicosities present bilaterally particularly in the medial calf and posterior calf area. Vessel Right Left  Radial Palpable Palpable                          PT Palpable Palpable  DP Palpable Palpable    Musculoskeletal: M/S 5/5 throughout.  No deformity or atrophy.  Trace left lower extremity edema. Neurologic: Sensation grossly intact in extremities.  Symmetrical.  Speech is fluent.  Psychiatric: Judgment intact, Mood & affect appropriate for pt's clinical situation. Dermatologic: No rashes or ulcers noted.  No cellulitis or open wounds.       Labs Recent Results (from the past 2160 hour(s))  CBC     Status: None   Collection Time: 01/13/18  8:29 AM  Result Value Ref Range   WBC 6.0 3.8 - 10.6 K/uL   RBC 5.37 4.40 - 5.90 MIL/uL  Hemoglobin 14.4 13.0 - 18.0 g/dL   HCT 43.9 40.0 - 52.0 %   MCV 81.8 80.0 - 100.0 fL   MCH 26.9 26.0 - 34.0 pg   MCHC 32.8 32.0 - 36.0 g/dL   RDW 13.9 11.5 - 14.5 %   Platelets 294 150 - 440 K/uL    Comment: Performed at [facility], [address] [place], [place] [address]  Comprehensive metabolic panel     Status: Abnormal   Collection Time: 01/13/18  8:29 AM  Result Value Ref Range   Sodium 138 135 - 145 mmol/L   Potassium 3.6 3.5 - 5.1 mmol/L   Chloride 101 98 - 111 mmol/L    Comment: Please note change in reference range.   CO2 30 22 - 32 mmol/L   Glucose, Bld 117 (H) 70 - 99 mg/dL    Comment: Please note change in reference range.   BUN 8 6 - 20 mg/dL    Comment: Please note change in reference range.   Creatinine, Ser 0.56 (L) 0.61 - 1.24 mg/dL   Calcium 8.6 (L) 8.9 - 10.3 mg/dL   Total Protein 8.9 (H) 6.5 - 8.1 g/dL   Albumin 3.9 3.5 - 5.0 g/dL   AST 22 15 - 41 U/L   ALT 18 0 - 44 U/L    Comment: Please note change in reference range.   Alkaline Phosphatase 81 38 - 126 U/L   Total Bilirubin 0.7 0.3 - 1.2 mg/dL   GFR calc non Af Amer >60 >60 mL/min   GFR calc Af Amer >60 >60 mL/min    Comment: (NOTE) The eGFR has been calculated using the CKD EPI equation. This calculation has not been validated in all clinical situations. eGFR's persistently <60 mL/min signify possible Chronic Kidney Disease.    Anion gap 7 5 - 15    Comment: Performed at [facility], [address], [place], [place] [address]    Radiology No results found.  Assessment/Plan Lower limb ulcer, ankle, left, with fat layer exposed ([place]) From venous disease. Now healed.  Swelling of limb From venous disease. Compression stockings and venous treatment.  Varicose veins of lower extremities with ulcer ([place]) His venous reflux study did not identify reflux in the greater small saphenous veins, but there was reflux in the deep venous system.  He has large superficial  branches that should still be treated with foam sclerotherapy bilaterally.  On the left, this clearly correlates with the area of ulceration.  Risks and benefits of foam sclerotherapy were discussed and he is agreeable to proceed.    Leotis Pain, MD  03/17/2018 9:36 AM    This note was created with Dragon medical transcription system.  Any errors from dictation are purely unintentional

## 2018-03-17 NOTE — Assessment & Plan Note (Signed)
His venous reflux study did not identify reflux in the greater small saphenous veins, but there was reflux in the deep venous system.  He has large superficial branches that should still be treated with foam sclerotherapy bilaterally.  On the left, this clearly correlates with the area of ulceration.  Risks and benefits of foam sclerotherapy were discussed and he is agreeable to proceed.

## 2018-03-17 NOTE — Patient Instructions (Signed)
Sclerotherapy Sclerotherapy is a procedure that is done to improve the appearance of varicose veins and spider veins and to help relieve aching, swelling, cramping, and pain in the legs. Varicose veins are veins that have become enlarged, bulging, and twisted due to a damaged valve that causes blood to collect (pool) in the veins. Spider veins are small varicose veins. Sclerotherapy usually works best for smaller spider and varicose veins. This procedure involves injecting a chemical into the vein to close it off. You may need more than one treatment to close a vein all the way. Sclerotherapy is usually performed on the legs because that is where varicose and spider veins most often occur. Tell a health care provider about:  Any allergies you have.  All medicines you are taking, including vitamins, herbs, eye drops, creams, and over-the-counter medicines.  Any blood disorders you have.  Any surgeries you have had.  Any medical conditions you have.  Whether you are pregnant or may be pregnant. What are the risks? Generally, this is a safe procedure. However, problems may occur, including:  Infection.  Bleeding.  Allergic reactions to medicines or dyes.  Blood clots.  Nerve damage.  Bruising and scarring.  Darkened skin around the area.  What happens before the procedure?  Do not use lotions or creams on your legs unless your health care provider approves.  Follow instructions from your health care provider about eating and drinking restrictions.  Do not use any products that contain nicotine or tobacco, such as cigarettes and e-cigarettes. If you need help quitting, ask your health care provider.  Ask your health care provider about: ? Changing or stopping your regular medicines. This is especially important if you are taking diabetes medicines or blood thinners. ? Taking medicines such as aspirin and ibuprofen. These medicines can thin your blood. Do not take these  medicines before your procedure if your health care provider instructs you not to.  You may have an ultrasound of the affected area to check for blood clots and to check blood flow.  In rare cases, you may have an X-ray procedure to check how blood flows through your veins (angiogram). For an angiogram, a dye is injected to outline your veins on X-rays. What happens during the procedure?  To lower your risk of infection: ? Your health care team will wash or sanitize their hands. ? Your skin will be washed with soap. ? Hair may be removed from the treatment area.  A small, thin needle will be used to inject a chemical (sclerosant) into your varicose vein. The sclerosant will irritate the lining of the vein and cause the vein to close below the injection site. You may feel some stinging, burning, or irritation.  The injection may be repeated for more than one varicose vein.  The injection area will be wrapped with elastic bandages. The procedure may vary among health care providers and hospitals. What happens after the procedure?  Your injection area will be wrapped with elastic bandages. If there is bleeding, the bandages may be changed.  Do not drive until your health care provider approves. You may need to wait 1-2 days before driving.  You will need to wear compression stockings for about a week, or as long as your health care provider recommends. Summary  Sclerotherapy is a procedure that is done to improve the appearance of varicose veins and spider veins and to help relieve aching, swelling, cramping, and pain in the legs.  A small, thin needle is   used to inject a chemical (sclerosant) into a spider vein or varicose vein to close it off.  Elastic bandages will be wrapped around the injection area after the procedure. This information is not intended to replace advice given to you by your health care provider. Make sure you discuss any questions you have with your health care  provider. Document Released: 08/20/2016 Document Revised: 08/20/2016 Document Reviewed: 08/20/2016 Elsevier Interactive Patient Education  2018 Elsevier Inc.  

## 2018-03-19 ENCOUNTER — Ambulatory Visit (INDEPENDENT_AMBULATORY_CARE_PROVIDER_SITE_OTHER): Payer: PRIVATE HEALTH INSURANCE | Admitting: Vascular Surgery

## 2018-08-11 ENCOUNTER — Encounter (INDEPENDENT_AMBULATORY_CARE_PROVIDER_SITE_OTHER): Payer: PRIVATE HEALTH INSURANCE

## 2018-08-11 ENCOUNTER — Ambulatory Visit (INDEPENDENT_AMBULATORY_CARE_PROVIDER_SITE_OTHER): Payer: PRIVATE HEALTH INSURANCE | Admitting: Vascular Surgery

## 2018-08-11 ENCOUNTER — Encounter

## 2019-11-04 DIAGNOSIS — M170 Bilateral primary osteoarthritis of knee: Secondary | ICD-10-CM | POA: Diagnosis not present

## 2019-11-07 DIAGNOSIS — M170 Bilateral primary osteoarthritis of knee: Secondary | ICD-10-CM | POA: Insufficient documentation

## 2019-11-07 DIAGNOSIS — M1711 Unilateral primary osteoarthritis, right knee: Secondary | ICD-10-CM | POA: Insufficient documentation

## 2019-12-31 ENCOUNTER — Other Ambulatory Visit: Payer: Self-pay

## 2019-12-31 ENCOUNTER — Encounter
Admission: RE | Admit: 2019-12-31 | Discharge: 2019-12-31 | Disposition: A | Discharge status: Home / Self Care | Payer: BC Managed Care – PPO | Source: Ambulatory Visit | Attending: Orthopedic Surgery | Admitting: Orthopedic Surgery

## 2019-12-31 NOTE — Patient Instructions (Signed)
Your procedure is scheduled on: Wednesday January 12, 2020. Su procedimiento est programado para: [place] 2021. Report to Day Surgery inside [facility] 2[facility] floor. Presntese a: [place] Psychologist, occupational 2do piso. To find out your arrival time please call [phone] between 1PM - 3PM on Tuesday January 11, 2020. Para saber su hora de llegada por favor llame al [phone] entre la 1PM - [place]: [place] 2021.   Remember: Instructions that are not followed completely may result in serious medical risk, up to and including death,  or upon the discretion of your surgeon and anesthesiologist your surgery may need to be rescheduled.  Recuerde: Las instrucciones que no se siguen completamente Heritage manager en un riesgo de salud grave, incluyendo hasta  la [place] o a discrecin de su cirujano y Environmental health practitioner, su ciruga se puede posponer.   __X_ 1.Do not eat food after midnight the night before your procedure. No    gum chewing or hard candies. You may drink clear liquids up to 2 hours     before you are scheduled to arrive for your surgery- DO not drink clear     Liquids within 2 hours of the start of your surgery.     Clear Liquids include:    water, apple juice without pulp, clear carbohydrate drink such as    Clearfast of [place], Black Coffee or Tea (Do not add anything to coffee or tea).      No coma nada despus de la medianoche de la noche anterior a su    procedimiento. No coma chicles ni caramelos duros. Puede tomar    lquidos claros hasta 2 horas antes de su hora programada de llegada al     hospital para su procedimiento. No tome lquidos claros durante el     transcurso de las 2 horas de su llegada programada al hospital para su     procedimiento, ya que esto puede llevar a que su procedimiento se    retrase o tenga que volver a Health and safety inspector.  Los lquidos claros incluyen:          - Agua o jugo de [place] sin pulpa           - Bebidas claras con carbohidratos como ClearFast o Gatorade          - Caf negro o t claro (sin leche, sin cremas, no agregue nada al caf ni al t)  No tome nada que no est en esta lista.  Los pacientes con diabetes tipo 1 y tipo 2 solo deben Agricultural engineer.  Llame a la clnica de PreCare o a la unidad de Same Day Surgery si  tiene alguna pregunta sobre estas instrucciones.    __X__2. Complete the carbohydrate drink provided to you, 2 hours before arrival.   Complete la bebida de carbohidratos que se le proporcion, 2 horas antes de la llegada   __X__3. On the morning of surgery brush your teeth with toothpaste and water, you                 may rinse your mouth with mouthwash if you wish.  Do not swallow any toothpaste of mouthwash.   En la maana de la [place], cepllese los dientes con pasta de dientes y [place],                [place] enjuagarse la boca con enjuague bucal si lo desea. No trague ninguna pasta de  dientes o enjuague bucal.              _X__ 4.Do Not Smoke or use e-cigarettes For 24 Hours Prior to Your Surgery.    Do not use any chewable tobacco products for at least 6   hours prior to surgery.    No fume ni use cigarrillos electrnicos durante las 24 horas previas    a su [place].  No use ningn producto de tabaco masticable durante   al menos 6 horas antes de la [place].     __X_ 5. No alcohol for 24 hours before or after surgery.    No tome alcohol durante las 24 horas antes ni despus de la [place].   __X__ 6. Notify your doctor if there is any change in your medical condition (cold,fever, infections).    Informe a su mdico si hay algn cambio en su condicin mdica  (resfriado, fiebre, infecciones).   Do not wear jewelry, make-up, hairpins, clips or nail polish.  No use joyas, maquillajes, pinzas/ganchos para el cabello ni esmalte de uas.  Do not wear lotions, powders, or perfumes. You may wear deodorant.  No use lociones, polvos o perfumes.  Puede usar  desodorante.    Do not shave 48 hours prior to surgery. Men may shave face and neck.  No se afeite 48 horas antes de la [place].  Los hombres pueden [employer] cara  y el cuello.   Do not bring valuables to the hospital.   No lleve objetos de valor al hospital.  [facility] is not responsible for any belongings or valuables.  [place] no se hace responsable de ningn tipo de pertenencias u objetos de Licensed conveyancer.               Contacts, dentures or bridgework may not be worn into surgery.  Los lentes de [place], las dentaduras postizas o puentes no se pueden usar en la [place].   Leave your suitcase in the car. After surgery it may be brought to your room.  Deje su maleta en el auto.  Despus de la ciruga podr traerla a su habitacin.   For patients admitted to the hospital, discharge time is determined by your  treatment team.  Para los pacientes que sean ingresados al hospital, el tiempo en el cual se le  dar de alta es determinado por su equipo de [place].   Patients discharged the day of surgery will not be allowed to drive home. A los pacientes que se les da de alta el mismo da de la ciruga no se les permitir conducir a Higher education careers adviser.   Please read over the following fact sheets that you were given: Por favor lea las siguientes hojas de informacin que le dieron:   Total Joint Packet   ____ Take these medicines the morning of surgery with A SIP OF WATER:          [place] medicinas la maana de la ciruga con UN SORBO DE AGUA:  1. ninguna   __X__ Use CHG Soap as directed          Utilice el jabn de CHG segn lo indicado  __X__ Stop Anti-inflammatories such as Ibuprofen, Motrin, Aleve, naproxen, Advil, aspirin and or BC powders on 01/02/20.          Deje de tomar antiinflamatorios como ibuprofen, Motrin, naproxen, Advil, aspirinas y polvos de BC powers el 20 de Junio del 2021.   __X__ Stop supplements until after surgery  Deje de tomar suplementos hasta despus  de la cir[place]  __X__ Do not start any herbal supplements before your surgery.           No empieze a tomar suplementos de hierbas antes de su cirugia.

## 2019-12-31 NOTE — Discharge Instructions (Signed)
Instructions after Total Knee Replacement   Jaeli Grubb P. Othello Dickenson, Jr., M.D.     Dept. of Orthopaedics & Sports Medicine  [facility]  [address]  [place], [place]  [address]  Phone: [phone]   Fax: [phone]    DIET: Drink plenty of non-alcoholic fluids. Resume your normal diet. Include foods high in fiber.  ACTIVITY:  You may use crutches or a walker with weight-bearing as tolerated, unless instructed otherwise. You may be weaned off of the walker or crutches by your Physical Therapist.  Do NOT place pillows under the knee. Anything placed under the knee could limit your ability to straighten the knee.   Continue doing gentle exercises. Exercising will reduce the pain and swelling, increase motion, and prevent muscle weakness.   Please continue to use the TED compression stockings for 6 weeks. You may remove the stockings at night, but should reapply them in the morning. Do not drive or operate any equipment until instructed.  WOUND CARE:  Continue to use the PolarCare or ice packs periodically to reduce pain and swelling. You may bathe or shower after the staples are removed at the first office visit following surgery.  MEDICATIONS: You may resume your regular medications. Please take the pain medication as prescribed on the medication. Do not take pain medication on an empty stomach. You have been given a prescription for a blood thinner (Lovenox or Coumadin). Please take the medication as instructed. (NOTE: After completing a 2 week course of Lovenox, take one Enteric-coated aspirin once a day. This along with elevation will help reduce the possibility of phlebitis in your operated leg.) Do not drive or drink alcoholic beverages when taking pain medications.  CALL THE OFFICE FOR: Temperature above 101 degrees Excessive bleeding or drainage on the dressing. Excessive swelling, coldness, or paleness of the toes. Persistent nausea and vomiting.  FOLLOW-UP:  You  should have an appointment to return to the office in 10-14 days after surgery. Arrangements have been made for continuation of Physical Therapy (either home therapy or outpatient therapy).   [facility] Department Directory         [email or web address]       [email or web address]          Cardiology  Appointments: [place] - [phone] [place] - [phone]  Endocrinology  Appointments: [place] - [phone] [place] - [phone]  Gastroenterology  Appointments: [place] - [phone] [place] - [phone]        General Surgery   Appointments: [place] - [phone]  Internal Medicine/Family Medicine  Appointments: [place] - [phone] [place] - [phone] [place] - [phone]  Metabolic and Weigh Loss Surgery  Appointments: [place] - [phone]        Neurology  Appointments: [place] - [phone] [place] - [phone]  Neurosurgery  Appointments: [place] - [phone]  Obstetrics & Gynecology  Appointments: [place] - [phone] [place] - [phone]        Pediatrics  Appointments: [place] - [phone] [place] - [phone]  Physiatry  Appointments: [place] -[phone]  Physical Therapy  Appointments: [place] - [phone] [place] - [phone]        Podiatry  Appointments: [place] - [phone] [place] - [phone]  Pulmonology  Appointments: [place] - [phone]  Rheumatology  Appointments: [place] - [phone]        [place] Location: [facility]  [address] , [place]  [address]  [place] Location: [facility] [address] [place], [place]  [address]  [place] Location: [facility] [address] [place], [place]  [address]    

## 2020-01-06 ENCOUNTER — Other Ambulatory Visit: Payer: Self-pay

## 2020-01-06 ENCOUNTER — Encounter
Admission: RE | Admit: 2020-01-06 | Discharge: 2020-01-06 | Disposition: A | Discharge status: Home / Self Care | Payer: BC Managed Care – PPO | Source: Ambulatory Visit | Attending: Orthopedic Surgery | Admitting: Orthopedic Surgery

## 2020-01-06 DIAGNOSIS — Z01818 Encounter for other preprocedural examination: Secondary | ICD-10-CM | POA: Insufficient documentation

## 2020-01-06 LAB — CBC
HCT: 41.1 % (ref 39.0–52.0)
Hemoglobin: 13.4 g/dL (ref 13.0–17.0)
MCH: 26.3 pg (ref 26.0–34.0)
MCHC: 32.6 g/dL (ref 30.0–36.0)
MCV: 80.6 fL (ref 80.0–100.0)
Platelets: 393 10*3/uL (ref 150–400)
RBC: 5.1 MIL/uL (ref 4.22–5.81)
RDW: 13.9 % (ref 11.5–15.5)
WBC: 7.6 10*3/uL (ref 4.0–10.5)
nRBC: 0 % (ref 0.0–0.2)

## 2020-01-06 LAB — PROTIME-INR
INR: 1.1 (ref 0.8–1.2)
Prothrombin Time: 13.5 seconds (ref 11.4–15.2)

## 2020-01-06 LAB — COMPREHENSIVE METABOLIC PANEL
ALT: 12 U/L (ref 0–44)
AST: 15 U/L (ref 15–41)
Albumin: 3.6 g/dL (ref 3.5–5.0)
Alkaline Phosphatase: 92 U/L (ref 38–126)
Anion gap: 11 (ref 5–15)
BUN: 8 mg/dL (ref 8–23)
CO2: 25 mmol/L (ref 22–32)
Calcium: 9 mg/dL (ref 8.9–10.3)
Chloride: 96 mmol/L — ABNORMAL LOW (ref 98–111)
Creatinine, Ser: 0.66 mg/dL (ref 0.61–1.24)
GFR calc Af Amer: >60 mL/min (ref >60)
GFR calc non Af Amer: >60 mL/min (ref >60)
Glucose, Bld: 100 mg/dL — ABNORMAL HIGH (ref 70–99)
Potassium: 3.9 mmol/L (ref 3.5–5.1)
Sodium: 132 mmol/L — ABNORMAL LOW (ref 135–145)
Total Bilirubin: 0.6 mg/dL (ref 0.3–1.2)
Total Protein: 9.1 g/dL — ABNORMAL HIGH (ref 6.5–8.1)

## 2020-01-06 LAB — TYPE AND SCREEN
ABO/RH(D): A POS
Antibody Screen: NEGATIVE

## 2020-01-06 LAB — URINALYSIS, ROUTINE W REFLEX MICROSCOPIC
Bilirubin Urine: NEGATIVE
Glucose, UA: NEGATIVE mg/dL
Hgb urine dipstick: NEGATIVE
Ketones, ur: NEGATIVE mg/dL
Leukocytes,Ua: NEGATIVE
Nitrite: NEGATIVE
Protein, ur: NEGATIVE mg/dL
Specific Gravity, Urine: 1.002 — ABNORMAL LOW (ref 1.005–1.030)
pH: 7 (ref 5.0–8.0)

## 2020-01-06 LAB — SURGICAL PCR SCREEN
MRSA, PCR: NEGATIVE
Staphylococcus aureus: NEGATIVE

## 2020-01-06 LAB — APTT: aPTT: 31 seconds (ref 24–36)

## 2020-01-06 LAB — C-REACTIVE PROTEIN: CRP: 1.1 mg/dL — ABNORMAL HIGH (ref <1.0)

## 2020-01-06 LAB — SEDIMENTATION RATE: Sed Rate: 28 mm/hr — ABNORMAL HIGH (ref 0–20)

## 2020-01-07 LAB — URINE CULTURE
Culture: NO GROWTH
Special Requests: NORMAL

## 2020-01-10 ENCOUNTER — Other Ambulatory Visit
Admission: RE | Admit: 2020-01-10 | Discharge: 2020-01-10 | Disposition: A | Discharge status: Home / Self Care | Payer: BC Managed Care – PPO | Source: Ambulatory Visit | Attending: Orthopedic Surgery | Admitting: Orthopedic Surgery

## 2020-01-10 ENCOUNTER — Other Ambulatory Visit: Payer: Self-pay

## 2020-01-10 DIAGNOSIS — Z20822 Contact with and (suspected) exposure to covid-19: Secondary | ICD-10-CM | POA: Insufficient documentation

## 2020-01-10 DIAGNOSIS — Z01812 Encounter for preprocedural laboratory examination: Secondary | ICD-10-CM | POA: Insufficient documentation

## 2020-01-10 LAB — SARS CORONAVIRUS 2 (TAT 6-24 HRS): SARS Coronavirus 2: NEGATIVE

## 2020-01-11 ENCOUNTER — Encounter: Payer: Self-pay | Admitting: Orthopedic Surgery

## 2020-01-11 NOTE — H&P (Signed)
ORTHOPAEDIC HISTORY & PHYSICAL Progress Notes Michelene Gardener, PA - 01/07/2020 9:00 AM EDT [facility] Chief Complaint:   Chief Complaint  Patient presents with  . Knee Pain  H & P LEFT KNEE   History of Present Illness:   [name] is a 64 y.o. male that presents to clinic today for his preoperative history and evaluation. Patient presents with his wife. The patient is scheduled to undergo a left total knee arthroplasty on 01/12/20 by Dr. Ernest Pine. His pain began several years ago. The pain is located along the medial and lateral aspects of the knees. He describes his pain as worse with weightbearing. He reports associated swelling with some giving way of the knees. He denies associated numbness or tingling, denies locking.   The patient's symptoms have progressed to the point that they decrease his quality of life. The patient has previously undergone conservative treatment including NSAIDS, Tylenol, and topical agents without adequate control of his symptoms.  Denies history of blood clots, denies significant cardiac history, denies history of lower back surgery.  Past Medical, Surgical, Family, Social History, Allergies, Medications:   Past Medical History:  Past Medical History:  Diagnosis Date  . Varicose veins of both lower extremities   Past Surgical History:  Past Surgical History:  Procedure Laterality Date  . ORIF left distal radius Left 05/11/2013   Current Medications:  Current Outpatient Medications  Medication Sig Dispense Refill  . celecoxib (CELEBREX) 200 MG capsule Take 1 capsule (200 mg total) by mouth once daily 30 capsule 1  . ibuprofen (MOTRIN) 200 MG tablet Take 400 mg by mouth   No current facility-administered medications for this visit.   Allergies: No Known Allergies  Social History:  Social History   Socioeconomic History  . Marital status: Married  Spouse name: Abbey Chatters  . Number  of children: 2  . Years of education: 5  . Highest education level: Not on file  Occupational History  . Occupation: Full-time- [employer]  Tobacco Use  . Smoking status: Former Smoker  Packs/day: 1.00  Years: 20.00  Pack years: 20.00  Types: Cigarettes  Quit date: 1998  Years since quitting: 23.5  . Smokeless tobacco: Never Used  Substance and Sexual Activity  . Alcohol use: Not on file  . Drug use: Never  . Sexual activity: Yes  Partners: Female  Other Topics Concern  . Not on file  Social History Narrative  . Not on file   Social Determinants of Health   Financial Resource Strain:  . Difficulty of Paying Living Expenses:  Food Insecurity:  . Worried About Programme researcher, broadcasting/film/video in the Last Year:  . Barista in the Last Year:  Transportation Needs:  . Freight forwarder (Medical):  Marland Kitchen Lack of Transportation (Non-Medical):  Physical Activity:  . Days of Exercise per Week:  . Minutes of Exercise per Session:  Stress:  . Feeling of Stress :  Social Connections:  . Frequency of Communication with Friends and Family:  . Frequency of Social Gatherings with Friends and Family:  . Attends Religious Services:  . Active Member of Clubs or Organizations:  . Attends Banker Meetings:  Marland Kitchen Marital Status:   Family History:  Family History  Problem Relation Age of Onset  . Stroke Mother   Review of Systems:   A 10+ ROS was performed, reviewed, and the pertinent orthopaedic findings are documented in the HPI.  Physical Examination:   BP 110/80  Ht 177.8 cm (5\' 10" )  Wt 72.4 kg (159 lb 9.6 oz)  BMI 22.90 kg/m   Patient is a well-developed, well-nourished male in no acute distress. Patient has normal mood and affect. Patient is alert and oriented to person, place, and time.   HEENT: Atraumatic, normocephalic. Pupils equal and reactive to light. Extraocular motion intact. Noninjected sclera.  Cardiovascular: Regular rate and rhythm, with no  murmurs, rubs, or gallops. Distal pulses palpable.  Respiratory: Lungs clear to auscultation bilaterally.   Left Knee: Soft tissue swelling: mild Effusion: minimal Erythema: none Crepitance: mild Tenderness: medial, lateral Alignment: relative varus Mediolateral laxity: medial pseudolaxity Posterior sag: negative Patellar tracking: Good tracking without evidence of subluxation or tilt Atrophy: No significant atrophy.  Quadriceps tone was fair to good. Range of motion: 0/15/104 degrees  Sensation intact over the saphenous, lateral sural cutaneous, superficial fibular, and deep fibular nerve distributions.  Tests Performed/Reviewed:  X-rays  No new radiographs were obtained today. Previous radiographs were reviewed of the left knee and revealed complete loss of medial compartment joint space with osteophyte formation. Lateral compartment reveals loss of joint space with osteophyte formation. Patellofemoral joint reveals mild loss of joint space without significant osteophyte formation.  Impression:   ICD-10-CM  1. Primary osteoarthritis of left knee M17.12   Plan:   The patient has end-stage degenerative changes of the left knee. It was explained to the patient that the condition is progressive in nature. Having failed conservative treatment, the patient has elected to proceed with a total joint arthroplasty. The patient will undergo a total joint arthroplasty with Dr. 12-24-1984. The risks of surgery, including blood clot and infection, were discussed with the patient. Measures to reduce these risks, including the use of anticoagulation, perioperative antibiotics, and early ambulation were discussed. The importance of postoperative physical therapy was discussed with the patient. The patient elects to proceed with surgery. The patient is instructed to stop all blood thinners prior to surgery. The patient is instructed to call the hospital the day before surgery to learn of the proper  arrival time.   Contact our office with any questions or concerns. Follow up as indicated, or sooner should any new problems arise, if conditions worsen, or if they are otherwise concerned.   Ernest Pine, PA-C [facility] [address] [place], [place] Phone: [phone]  This note was generated in part with voice recognition software and I apologize for any typographical errors that were not detected and corrected.   Electronically signed by [phone], PA at 01/10/2020 7:37 PM EDT

## 2020-01-12 ENCOUNTER — Encounter: Payer: Self-pay | Admitting: Orthopedic Surgery

## 2020-01-12 ENCOUNTER — Inpatient Hospital Stay
Admission: RE | Admit: 2020-01-12 | Discharge: 2020-01-14 | DRG: 470 | Disposition: A | Discharge status: Home Health | Payer: BC Managed Care – PPO | Attending: Orthopedic Surgery | Admitting: Orthopedic Surgery

## 2020-01-12 ENCOUNTER — Inpatient Hospital Stay: Payer: BC Managed Care – PPO | Admitting: Anesthesiology

## 2020-01-12 ENCOUNTER — Encounter
Admission: RE | Disposition: A | Discharge status: Home / Self Care | Payer: Self-pay | Source: Home / Self Care | Attending: Orthopedic Surgery

## 2020-01-12 ENCOUNTER — Inpatient Hospital Stay: Payer: BC Managed Care – PPO

## 2020-01-12 ENCOUNTER — Other Ambulatory Visit: Payer: Self-pay

## 2020-01-12 DIAGNOSIS — Z87891 Personal history of nicotine dependence: Secondary | ICD-10-CM | POA: Diagnosis not present

## 2020-01-12 DIAGNOSIS — Z20822 Contact with and (suspected) exposure to covid-19: Secondary | ICD-10-CM | POA: Diagnosis present

## 2020-01-12 DIAGNOSIS — Z96659 Presence of unspecified artificial knee joint: Secondary | ICD-10-CM

## 2020-01-12 DIAGNOSIS — Z79899 Other long term (current) drug therapy: Secondary | ICD-10-CM

## 2020-01-12 DIAGNOSIS — Z96652 Presence of left artificial knee joint: Secondary | ICD-10-CM

## 2020-01-12 DIAGNOSIS — M1712 Unilateral primary osteoarthritis, left knee: Secondary | ICD-10-CM | POA: Diagnosis present

## 2020-01-12 DIAGNOSIS — Z791 Long term (current) use of non-steroidal anti-inflammatories (NSAID): Secondary | ICD-10-CM

## 2020-01-12 HISTORY — PX: KNEE ARTHROPLASTY: SHX992

## 2020-01-12 LAB — ABO/RH: ABO/RH(D): A POS

## 2020-01-12 SURGERY — ARTHROPLASTY, KNEE, TOTAL, USING IMAGELESS COMPUTER-ASSISTED NAVIGATION
Anesthesia: Spinal | Site: Knee | Laterality: Left

## 2020-01-12 MED ORDER — FENTANYL CITRATE (PF) 100 MCG/2ML IJ SOLN
INTRAMUSCULAR | Status: AC
  Filled 2020-01-12: qty 2

## 2020-01-12 MED ORDER — CELECOXIB 200 MG PO CAPS: 400.0000 mg | ORAL_CAPSULE | Freq: Once | ORAL | Status: AC

## 2020-01-12 MED ORDER — FENTANYL CITRATE (PF) 100 MCG/2ML IJ SOLN
INTRAMUSCULAR | Status: AC
  Administered 2020-01-12: 50 ug via INTRAVENOUS
  Filled 2020-01-12: qty 2

## 2020-01-12 MED ORDER — GABAPENTIN 300 MG PO CAPS
300.0000 mg | ORAL_CAPSULE | Freq: Every day | ORAL | Status: DC
  Administered 2020-01-12 – 2020-01-13 (×2): 300 mg via ORAL
  Filled 2020-01-12 (×2): qty 1

## 2020-01-12 MED ORDER — MIDAZOLAM HCL 2 MG/2ML IJ SOLN
INTRAMUSCULAR | Status: DC | PRN
  Administered 2020-01-12 (×2): 1 mg via INTRAVENOUS

## 2020-01-12 MED ORDER — CEFAZOLIN SODIUM-DEXTROSE 2-4 GM/100ML-% IV SOLN
INTRAVENOUS | Status: AC
  Administered 2020-01-13: 2 g via INTRAVENOUS
  Filled 2020-01-12: qty 100

## 2020-01-12 MED ORDER — PROPOFOL 10 MG/ML IV BOLUS
INTRAVENOUS | Status: AC
  Filled 2020-01-12: qty 20

## 2020-01-12 MED ORDER — DEXAMETHASONE SODIUM PHOSPHATE 10 MG/ML IJ SOLN
INTRAMUSCULAR | Status: AC
  Administered 2020-01-12: 8 mg via INTRAVENOUS
  Filled 2020-01-12: qty 1

## 2020-01-12 MED ORDER — CEFAZOLIN SODIUM-DEXTROSE 2-4 GM/100ML-% IV SOLN
2.0000 g | Freq: Four times a day (QID) | INTRAVENOUS | Status: AC
  Administered 2020-01-12 – 2020-01-13 (×3): 2 g via INTRAVENOUS
  Filled 2020-01-12 (×4): qty 100

## 2020-01-12 MED ORDER — CHLORHEXIDINE GLUCONATE 4 % EX LIQD: 60.0000 mL | Freq: Once | CUTANEOUS | Status: DC

## 2020-01-12 MED ORDER — NEOMYCIN-POLYMYXIN B GU 40-200000 IR SOLN
Status: DC | PRN
  Administered 2020-01-12: 14 mL

## 2020-01-12 MED ORDER — SENNOSIDES-DOCUSATE SODIUM 8.6-50 MG PO TABS
1.0000 | ORAL_TABLET | Freq: Two times a day (BID) | ORAL | Status: DC
  Administered 2020-01-12 – 2020-01-13 (×3): 1 via ORAL
  Filled 2020-01-12 (×3): qty 1

## 2020-01-12 MED ORDER — TRANEXAMIC ACID-NACL 1000-0.7 MG/100ML-% IV SOLN
1000.0000 mg | INTRAVENOUS | Status: AC
  Administered 2020-01-12: 1000 mg via INTRAVENOUS

## 2020-01-12 MED ORDER — ONDANSETRON HCL 4 MG/2ML IJ SOLN
INTRAMUSCULAR | Status: DC | PRN
  Administered 2020-01-12: 4 mg via INTRAVENOUS

## 2020-01-12 MED ORDER — BUPIVACAINE LIPOSOME 1.3 % IJ SUSP
INTRAMUSCULAR | Status: AC
  Filled 2020-01-12: qty 20

## 2020-01-12 MED ORDER — CELECOXIB 200 MG PO CAPS
ORAL_CAPSULE | ORAL | Status: AC
  Administered 2020-01-12: 400 mg via ORAL
  Filled 2020-01-12: qty 2

## 2020-01-12 MED ORDER — PROPOFOL 500 MG/50ML IV EMUL
INTRAVENOUS | Status: DC | PRN
  Administered 2020-01-12: 100 ug/kg/min via INTRAVENOUS

## 2020-01-12 MED ORDER — ACETAMINOPHEN 10 MG/ML IV SOLN
1000.0000 mg | Freq: Four times a day (QID) | INTRAVENOUS | Status: AC
  Administered 2020-01-12 – 2020-01-13 (×4): 1000 mg via INTRAVENOUS
  Filled 2020-01-12 (×4): qty 100

## 2020-01-12 MED ORDER — ONDANSETRON HCL 4 MG/2ML IJ SOLN
INTRAMUSCULAR | Status: AC
  Filled 2020-01-12: qty 2

## 2020-01-12 MED ORDER — BISACODYL 10 MG RE SUPP
10.0000 mg | Freq: Every day | RECTAL | Status: DC | PRN
  Administered 2020-01-14: 10 mg via RECTAL
  Filled 2020-01-12: qty 1

## 2020-01-12 MED ORDER — FENTANYL CITRATE (PF) 100 MCG/2ML IJ SOLN
25.0000 ug | INTRAMUSCULAR | Status: DC | PRN
  Administered 2020-01-12: 50 ug via INTRAVENOUS

## 2020-01-12 MED ORDER — PANTOPRAZOLE SODIUM 40 MG PO TBEC
40.0000 mg | DELAYED_RELEASE_TABLET | Freq: Two times a day (BID) | ORAL | Status: DC
  Administered 2020-01-12 – 2020-01-14 (×4): 40 mg via ORAL
  Filled 2020-01-12 (×4): qty 1

## 2020-01-12 MED ORDER — SODIUM CHLORIDE 0.9 % IV SOLN: INTRAVENOUS | Status: DC

## 2020-01-12 MED ORDER — MENTHOL 3 MG MT LOZG
1.0000 | LOZENGE | OROMUCOSAL | Status: DC | PRN
  Filled 2020-01-12: qty 9

## 2020-01-12 MED ORDER — NEOMYCIN-POLYMYXIN B GU 40-200000 IR SOLN
Status: AC
  Filled 2020-01-12: qty 20

## 2020-01-12 MED ORDER — BUPIVACAINE HCL (PF) 0.5 % IJ SOLN
INTRAMUSCULAR | Status: AC
  Filled 2020-01-12: qty 10

## 2020-01-12 MED ORDER — TRANEXAMIC ACID 1000 MG/10ML IV SOLN
1000.0000 mg | Freq: Once | INTRAVENOUS | Status: AC
  Administered 2020-01-12: 1000 mg via INTRAVENOUS
  Filled 2020-01-12: qty 10

## 2020-01-12 MED ORDER — ENSURE ENLIVE PO LIQD: 296.0000 mL | Freq: Once | ORAL | Status: DC

## 2020-01-12 MED ORDER — BUPIVACAINE HCL (PF) 0.5 % IJ SOLN
INTRAMUSCULAR | Status: DC | PRN
  Administered 2020-01-12: 3 mL

## 2020-01-12 MED ORDER — ONDANSETRON HCL 4 MG PO TABS: 4.0000 mg | ORAL_TABLET | Freq: Four times a day (QID) | ORAL | Status: DC | PRN

## 2020-01-12 MED ORDER — SODIUM CHLORIDE 0.9 % IV SOLN
INTRAVENOUS | Status: DC | PRN
  Administered 2020-01-12: 20 ug/min via INTRAVENOUS

## 2020-01-12 MED ORDER — CEFAZOLIN SODIUM-DEXTROSE 2-4 GM/100ML-% IV SOLN
2.0000 g | INTRAVENOUS | Status: AC
  Administered 2020-01-12: 2 g via INTRAVENOUS

## 2020-01-12 MED ORDER — FLEET ENEMA 7-19 GM/118ML RE ENEM: 1.0000 | ENEMA | Freq: Once | RECTAL | Status: DC | PRN

## 2020-01-12 MED ORDER — SODIUM CHLORIDE FLUSH 0.9 % IV SOLN
INTRAVENOUS | Status: AC
  Filled 2020-01-12: qty 40

## 2020-01-12 MED ORDER — TRANEXAMIC ACID-NACL 1000-0.7 MG/100ML-% IV SOLN
INTRAVENOUS | Status: AC
  Filled 2020-01-12: qty 100

## 2020-01-12 MED ORDER — PHENOL 1.4 % MT LIQD
1.0000 | OROMUCOSAL | Status: DC | PRN
  Filled 2020-01-12: qty 177

## 2020-01-12 MED ORDER — FAMOTIDINE 20 MG PO TABS
ORAL_TABLET | ORAL | Status: AC
  Administered 2020-01-12: 20 mg
  Filled 2020-01-12: qty 1

## 2020-01-12 MED ORDER — OXYCODONE HCL 5 MG PO TABS
10.0000 mg | ORAL_TABLET | ORAL | Status: DC | PRN
  Administered 2020-01-13 (×3): 10 mg via ORAL
  Filled 2020-01-12 (×3): qty 2

## 2020-01-12 MED ORDER — MAGNESIUM HYDROXIDE 400 MG/5ML PO SUSP
30.0000 mL | Freq: Every day | ORAL | Status: DC
  Administered 2020-01-12 – 2020-01-13 (×2): 30 mL via ORAL
  Filled 2020-01-12 (×3): qty 30

## 2020-01-12 MED ORDER — FERROUS SULFATE 325 (65 FE) MG PO TABS
325.0000 mg | ORAL_TABLET | Freq: Two times a day (BID) | ORAL | Status: DC
  Administered 2020-01-12 – 2020-01-14 (×4): 325 mg via ORAL
  Filled 2020-01-12 (×4): qty 1

## 2020-01-12 MED ORDER — CELECOXIB 200 MG PO CAPS
200.0000 mg | ORAL_CAPSULE | Freq: Two times a day (BID) | ORAL | Status: DC
  Administered 2020-01-12 – 2020-01-14 (×4): 200 mg via ORAL
  Filled 2020-01-12 (×4): qty 1

## 2020-01-12 MED ORDER — BUPIVACAINE HCL (PF) 0.25 % IJ SOLN
INTRAMUSCULAR | Status: DC | PRN
  Administered 2020-01-12: 60 mL

## 2020-01-12 MED ORDER — ENOXAPARIN SODIUM 30 MG/0.3ML ~~LOC~~ SOLN
30.0000 mg | Freq: Two times a day (BID) | SUBCUTANEOUS | Status: DC
  Administered 2020-01-13 – 2020-01-14 (×3): 30 mg via SUBCUTANEOUS
  Filled 2020-01-12 (×3): qty 0.3

## 2020-01-12 MED ORDER — DEXAMETHASONE SODIUM PHOSPHATE 10 MG/ML IJ SOLN: 8.0000 mg | Freq: Once | INTRAMUSCULAR | Status: AC

## 2020-01-12 MED ORDER — MIDAZOLAM HCL 2 MG/2ML IJ SOLN
INTRAMUSCULAR | Status: AC
  Filled 2020-01-12: qty 2

## 2020-01-12 MED ORDER — METOCLOPRAMIDE HCL 5 MG/ML IJ SOLN: 5.0000 mg | Freq: Three times a day (TID) | INTRAMUSCULAR | Status: DC | PRN

## 2020-01-12 MED ORDER — SODIUM CHLORIDE 0.9 % IV SOLN
INTRAVENOUS | Status: DC | PRN
  Administered 2020-01-12: 60 mL

## 2020-01-12 MED ORDER — METOCLOPRAMIDE HCL 10 MG PO TABS
10.0000 mg | ORAL_TABLET | Freq: Three times a day (TID) | ORAL | Status: DC
  Administered 2020-01-12 – 2020-01-14 (×7): 10 mg via ORAL
  Filled 2020-01-12 (×7): qty 1

## 2020-01-12 MED ORDER — PROPOFOL 10 MG/ML IV BOLUS
INTRAVENOUS | Status: DC | PRN
  Administered 2020-01-12: 20 mg via INTRAVENOUS
  Administered 2020-01-12: 50 mg via INTRAVENOUS
  Administered 2020-01-12: 40 mg via INTRAVENOUS

## 2020-01-12 MED ORDER — GABAPENTIN 300 MG PO CAPS: 300.0000 mg | ORAL_CAPSULE | Freq: Once | ORAL | Status: AC

## 2020-01-12 MED ORDER — ACETAMINOPHEN 10 MG/ML IV SOLN
1000.0000 mg | Freq: Once | INTRAVENOUS | Status: DC | PRN
  Administered 2020-01-12: 1000 mg via INTRAVENOUS

## 2020-01-12 MED ORDER — ONDANSETRON HCL 4 MG/2ML IJ SOLN: 4.0000 mg | Freq: Once | INTRAMUSCULAR | Status: DC | PRN

## 2020-01-12 MED ORDER — ONDANSETRON HCL 4 MG/2ML IJ SOLN: 4.0000 mg | Freq: Four times a day (QID) | INTRAMUSCULAR | Status: DC | PRN

## 2020-01-12 MED ORDER — METOCLOPRAMIDE HCL 10 MG PO TABS: 5.0000 mg | ORAL_TABLET | Freq: Three times a day (TID) | ORAL | Status: DC | PRN

## 2020-01-12 MED ORDER — BUPIVACAINE HCL (PF) 0.25 % IJ SOLN
INTRAMUSCULAR | Status: AC
  Filled 2020-01-12: qty 60

## 2020-01-12 MED ORDER — PHENYLEPHRINE HCL (PRESSORS) 10 MG/ML IV SOLN
INTRAVENOUS | Status: DC | PRN
  Administered 2020-01-12 (×2): 100 ug via INTRAVENOUS

## 2020-01-12 MED ORDER — ACETAMINOPHEN 10 MG/ML IV SOLN
INTRAVENOUS | Status: AC
  Filled 2020-01-12: qty 100

## 2020-01-12 MED ORDER — OXYCODONE HCL 5 MG/5ML PO SOLN: 5.0000 mg | Freq: Once | ORAL | Status: DC | PRN

## 2020-01-12 MED ORDER — HYDROMORPHONE HCL 1 MG/ML IJ SOLN: 0.5000 mg | INTRAMUSCULAR | Status: DC | PRN

## 2020-01-12 MED ORDER — DIPHENHYDRAMINE HCL 12.5 MG/5ML PO ELIX: 12.5000 mg | ORAL_SOLUTION | ORAL | Status: DC | PRN

## 2020-01-12 MED ORDER — CHLORHEXIDINE GLUCONATE 0.12 % MT SOLN: 15.0000 mL | Freq: Once | OROMUCOSAL | Status: AC

## 2020-01-12 MED ORDER — ALUM & MAG HYDROXIDE-SIMETH 200-200-20 MG/5ML PO SUSP: 30.0000 mL | ORAL | Status: DC | PRN

## 2020-01-12 MED ORDER — TRAMADOL HCL 50 MG PO TABS
50.0000 mg | ORAL_TABLET | ORAL | Status: DC | PRN
  Administered 2020-01-13 – 2020-01-14 (×2): 100 mg via ORAL
  Filled 2020-01-12 (×2): qty 2

## 2020-01-12 MED ORDER — ACETAMINOPHEN 325 MG PO TABS: 325.0000 mg | ORAL_TABLET | Freq: Four times a day (QID) | ORAL | Status: DC | PRN

## 2020-01-12 MED ORDER — PROPOFOL 500 MG/50ML IV EMUL
INTRAVENOUS | Status: AC
  Filled 2020-01-12: qty 50

## 2020-01-12 MED ORDER — GABAPENTIN 300 MG PO CAPS
ORAL_CAPSULE | ORAL | Status: AC
  Administered 2020-01-12: 300 mg via ORAL
  Filled 2020-01-12: qty 1

## 2020-01-12 MED ORDER — ORAL CARE MOUTH RINSE: 15.0000 mL | Freq: Once | OROMUCOSAL | Status: AC

## 2020-01-12 MED ORDER — OXYCODONE HCL 5 MG PO TABS
5.0000 mg | ORAL_TABLET | ORAL | Status: DC | PRN
  Administered 2020-01-12: 5 mg via ORAL
  Filled 2020-01-12: qty 1

## 2020-01-12 MED ORDER — LACTATED RINGERS IV SOLN: INTRAVENOUS | Status: DC

## 2020-01-12 MED ORDER — OXYCODONE HCL 5 MG PO TABS: 5.0000 mg | ORAL_TABLET | Freq: Once | ORAL | Status: DC | PRN

## 2020-01-12 MED ORDER — CHLORHEXIDINE GLUCONATE 0.12 % MT SOLN
OROMUCOSAL | Status: AC
  Administered 2020-01-12: 15 mL via OROMUCOSAL
  Filled 2020-01-12: qty 15

## 2020-01-12 SURGICAL SUPPLY — 76 items
ATTUNE MED DOME PAT 38 KNEE (Knees) ×1 IMPLANT
ATTUNE MED DOME PAT 38MM KNEE (Knees) ×1 IMPLANT
ATTUNE PS FEM LT SZ 7 CEM KNEE (Femur) ×2 IMPLANT
ATTUNE PSRP INSR SZ7 10 KNEE (Insert) ×1 IMPLANT
ATTUNE PSRP INSR SZ7 10MM KNEE (Insert) ×1 IMPLANT
BASE TIBIAL ROT PLAT SZ 7 KNEE (Knees) IMPLANT
BATTERY INSTRU NAVIGATION (MISCELLANEOUS) ×12 IMPLANT
BLADE SAW 70X12.5 (BLADE) ×3 IMPLANT
BLADE SAW 90X13X1.19 OSCILLAT (BLADE) ×3 IMPLANT
BLADE SAW 90X25X1.19 OSCILLAT (BLADE) ×3 IMPLANT
CANISTER SUCT 3000ML PPV (MISCELLANEOUS) ×3 IMPLANT
CEMENT HV SMART SET (Cement) ×4 IMPLANT
COOLER POLAR GLACIER W/PUMP (MISCELLANEOUS) ×3 IMPLANT
COVER LIGHT HANDLE STERIS (MISCELLANEOUS) ×2 IMPLANT
COVER WAND RF STERILE (DRAPES) ×3 IMPLANT
CUFF TOURN SGL QUICK 24 (TOURNIQUET CUFF) ×2
CUFF TOURN SGL QUICK 30 (TOURNIQUET CUFF)
CUFF TRNQT CYL 24X4X16.5-23 (TOURNIQUET CUFF) IMPLANT
CUFF TRNQT CYL 30X4X21-28X (TOURNIQUET CUFF) IMPLANT
DRAPE 3/4 80X56 (DRAPES) ×3 IMPLANT
DRSG DERMACEA 8X12 NADH (GAUZE/BANDAGES/DRESSINGS) ×3 IMPLANT
DRSG OPSITE POSTOP 4X14 (GAUZE/BANDAGES/DRESSINGS) ×3 IMPLANT
DRSG TEGADERM 4X4.75 (GAUZE/BANDAGES/DRESSINGS) ×3 IMPLANT
DURAPREP 26ML APPLICATOR (WOUND CARE) ×6 IMPLANT
ELECT REM PT RETURN 9FT ADLT (ELECTROSURGICAL) ×3
ELECTRODE REM PT RTRN 9FT ADLT (ELECTROSURGICAL) ×1 IMPLANT
EX-PIN ORTHOLOCK NAV 4X150 (PIN) ×6 IMPLANT
GLOVE BIO SURGEON STRL SZ7.5 (GLOVE) ×6 IMPLANT
GLOVE BIOGEL M STRL SZ7.5 (GLOVE) ×6 IMPLANT
GLOVE BIOGEL PI IND STRL 7.5 (GLOVE) ×1 IMPLANT
GLOVE BIOGEL PI INDICATOR 7.5 (GLOVE) ×2
GLOVE INDICATOR 8.0 STRL GRN (GLOVE) ×3 IMPLANT
GOWN STRL REUS W/ TWL LRG LVL3 (GOWN DISPOSABLE) ×2 IMPLANT
GOWN STRL REUS W/ TWL XL LVL3 (GOWN DISPOSABLE) ×1 IMPLANT
GOWN STRL REUS W/TWL LRG LVL3 (GOWN DISPOSABLE) ×4
GOWN STRL REUS W/TWL XL LVL3 (GOWN DISPOSABLE) ×8
HEMOVAC 400CC 10FR (MISCELLANEOUS) ×5 IMPLANT
HOLDER FOLEY CATH W/STRAP (MISCELLANEOUS) ×3 IMPLANT
HOOD PEEL AWAY FLYTE STAYCOOL (MISCELLANEOUS) ×6 IMPLANT
KIT TURNOVER KIT A (KITS) ×3 IMPLANT
KNIFE SCULPS 14X20 (INSTRUMENTS) ×3 IMPLANT
LABEL OR SOLS (LABEL) ×3 IMPLANT
MANIFOLD NEPTUNE II (INSTRUMENTS) ×3 IMPLANT
NDL SAFETY ECLIPSE 18X1.5 (NEEDLE) ×1 IMPLANT
NDL SPNL 20GX3.5 QUINCKE YW (NEEDLE) ×2 IMPLANT
NEEDLE HYPO 18GX1.5 SHARP (NEEDLE) ×2
NEEDLE SPNL 20GX3.5 QUINCKE YW (NEEDLE) ×6 IMPLANT
NS IRRIG 500ML POUR BTL (IV SOLUTION) ×3 IMPLANT
PACK TOTAL KNEE (MISCELLANEOUS) ×3 IMPLANT
PAD WRAPON POLAR KNEE (MISCELLANEOUS) ×1 IMPLANT
PENCIL SMOKE EVACUATOR COATED (MISCELLANEOUS) ×1 IMPLANT
PENCIL SMOKE ULTRAEVAC 22 CON (MISCELLANEOUS) ×3 IMPLANT
PIN DRILL QUICK PACK ×3 IMPLANT
PIN FIXATION 1/8DIA X 3INL (PIN) ×9 IMPLANT
PULSAVAC PLUS IRRIG FAN TIP (DISPOSABLE) ×3
SOL .9 NS 3000ML IRR  AL (IV SOLUTION) ×2
SOL .9 NS 3000ML IRR UROMATIC (IV SOLUTION) ×1 IMPLANT
SOL PREP PVP 2OZ (MISCELLANEOUS) ×3
SOLUTION PREP PVP 2OZ (MISCELLANEOUS) ×1 IMPLANT
SPONGE DRAIN TRACH 4X4 STRL 2S (GAUZE/BANDAGES/DRESSINGS) ×3 IMPLANT
STAPLER SKIN PROX 35W (STAPLE) ×3 IMPLANT
STOCKINETTE IMPERV 14X48 (MISCELLANEOUS) ×2 IMPLANT
STRAP TIBIA SHORT (MISCELLANEOUS) ×3 IMPLANT
SUCTION FRAZIER HANDLE 10FR (MISCELLANEOUS) ×2
SUCTION TUBE FRAZIER 10FR DISP (MISCELLANEOUS) ×1 IMPLANT
SUT VIC AB 0 CT1 36 (SUTURE) ×6 IMPLANT
SUT VIC AB 1 CT1 36 (SUTURE) ×6 IMPLANT
SUT VIC AB 2-0 CT2 27 (SUTURE) ×3 IMPLANT
SYR 20ML LL LF (SYRINGE) ×3 IMPLANT
SYR 30ML LL (SYRINGE) ×6 IMPLANT
TIBIAL BASE ROT PLAT SZ 7 KNEE (Knees) ×3 IMPLANT
TIP FAN IRRIG PULSAVAC PLUS (DISPOSABLE) ×1 IMPLANT
TOWEL OR 17X26 4PK STRL BLUE (TOWEL DISPOSABLE) ×3 IMPLANT
TOWER CARTRIDGE SMART MIX (DISPOSABLE) ×3 IMPLANT
TRAY FOLEY MTR SLVR 16FR STAT (SET/KITS/TRAYS/PACK) ×3 IMPLANT
WRAPON POLAR PAD KNEE (MISCELLANEOUS) ×3

## 2020-01-12 NOTE — Anesthesia Procedure Notes (Signed)
Spinal  Patient location during procedure: OR Start time: 01/12/2020 7:25 AM End time: 01/12/2020 7:29 AM Staffing Performed: resident/CRNA  Anesthesiologist: Corinda Gubler, MD Resident/CRNA: Reece Agar, CRNA Preanesthetic Checklist Completed: patient identified, IV checked, site marked, risks and benefits discussed, surgical consent, monitors and equipment checked, pre-op evaluation and timeout performed Spinal Block Patient position: sitting Prep: DuraPrep Patient monitoring: heart rate, cardiac monitor, continuous pulse ox and blood pressure Approach: midline Location: L3-4 Injection technique: single-shot Needle Needle type: Sprotte  Needle gauge: 24 G Needle length: 9 cm Assessment Sensory level: T4

## 2020-01-12 NOTE — H&P (Signed)
The patient has been re-examined, and the chart reviewed, and there have been no interval changes to the documented history and physical.    The risks, benefits, and alternatives have been discussed at length. The patient expressed understanding of the risks benefits and agreed with plans for surgical intervention.  Ayman Brull P. Maimuna Leaman, Jr. M.D.    

## 2020-01-12 NOTE — Op Note (Signed)
OPERATIVE NOTE  DATE OF SURGERY:  01/12/2020  PATIENT NAME:  [name]   DOB: 1955/11/03  MRN: [id number]  PRE-OPERATIVE DIAGNOSIS: Degenerative arthrosis of the left knee, primary  POST-OPERATIVE DIAGNOSIS:  Same  PROCEDURE:  Left total knee arthroplasty using computer-assisted navigation  SURGEON:  Jena Gauss. M.D.  ASSISTANT: Baldwin Jamaica, PA-C (present and scrubbed throughout the case, critical for assistance with exposure, retraction, instrumentation, and closure)  ANESTHESIA: spinal  ESTIMATED BLOOD LOSS: 50 mL  FLUIDS REPLACED: 1200 mL of crystalloid  TOURNIQUET TIME: 77 minutes  DRAINS: 2 medium Hemovac drains  SOFT TISSUE RELEASES: Anterior cruciate ligament, posterior cruciate ligament, deep medial collateral ligament, patellofemoral ligament  IMPLANTS UTILIZED: DePuy Attune size 7 posterior stabilized femoral component (cemented), size 7 rotating platform tibial component (cemented), 38 mm medialized dome patella (cemented), and a 10 mm stabilized rotating platform polyethylene insert.  INDICATIONS FOR SURGERY: [name] is a 64 y.o. year old male with a long history of progressive knee pain. X-rays demonstrated severe degenerative changes in tricompartmental fashion. The patient had not seen any significant improvement despite conservative nonsurgical intervention. After discussion of the risks and benefits of surgical intervention, the patient expressed understanding of the risks benefits and agree with plans for total knee arthroplasty.   The risks, benefits, and alternatives were discussed at length including but not limited to the risks of infection, bleeding, nerve injury, stiffness, blood clots, the need for revision surgery, cardiopulmonary complications, among others, and they were willing to proceed.  PROCEDURE IN DETAIL: The patient was brought into the operating room and, after adequate spinal anesthesia was achieved, a tourniquet was  placed on the patient's upper thigh. The patient's knee and leg were cleaned and prepped with alcohol and DuraPrep and draped in the usual sterile fashion. A "timeout" was performed as per usual protocol. The lower extremity was exsanguinated using an Esmarch, and the tourniquet was inflated to 300 mmHg. An anterior longitudinal incision was made followed by a standard mid vastus approach. The deep fibers of the medial collateral ligament were elevated in a subperiosteal fashion off of the medial flare of the tibia so as to maintain a continuous soft tissue sleeve. The patella was subluxed laterally and the patellofemoral ligament was incised. Inspection of the knee demonstrated severe degenerative changes with full-thickness loss of articular cartilage. Osteophytes were debrided using a rongeur. Anterior and posterior cruciate ligaments were excised. Two 4.0 mm Schanz pins were inserted in the femur and into the tibia for attachment of the array of trackers used for computer-assisted navigation. Hip center was identified using a circumduction technique. Distal landmarks were mapped using the computer. The distal femur and proximal tibia were mapped using the computer. The distal femoral cutting guide was positioned using computer-assisted navigation so as to achieve a 5 distal valgus cut. The femur was sized and it was felt that a size 7 femoral component was appropriate. A size 7 femoral cutting guide was positioned and the anterior cut was performed and verified using the computer. This was followed by completion of the posterior and chamfer cuts. Femoral cutting guide for the central box was then positioned in the center box cut was performed.  Attention was then directed to the proximal tibia. Medial and lateral menisci were excised. The extramedullary tibial cutting guide was positioned using computer-assisted navigation so as to achieve a 0 varus-valgus alignment and 3 posterior slope. The cut was  performed and verified using the computer. The proximal tibia was  sized and it was felt that a size 7 tibial tray was appropriate. Tibial and femoral trials were inserted followed by insertion of a 10 mm polyethylene insert. This allowed for excellent mediolateral soft tissue balancing both in flexion and in full extension. Finally, the patella was cut and prepared so as to accommodate a 38 mm medialized dome patella. A patella trial was placed and the knee was placed through a range of motion with excellent patellar tracking appreciated. The femoral trial was removed after debridement of posterior osteophytes. The central post-hole for the tibial component was reamed followed by insertion of a keel punch. Tibial trials were then removed. Cut surfaces of bone were irrigated with copious amounts of normal saline with antibiotic solution using pulsatile lavage and then suctioned dry. Polymethylmethacrylate cement was prepared in the usual fashion using a vacuum mixer. Cement was applied to the cut surface of the proximal tibia as well as along the undersurface of a size 7 rotating platform tibial component. Tibial component was positioned and impacted into place. Excess cement was removed using Personal assistant. Cement was then applied to the cut surfaces of the femur as well as along the posterior flanges of the size 7 femoral component. The femoral component was positioned and impacted into place. Excess cement was removed using Personal assistant. A 10 mm polyethylene trial was inserted and the knee was brought into full extension with steady axial compression applied. Finally, cement was applied to the backside of a 38 mm medialized dome patella and the patellar component was positioned and patellar clamp applied. Excess cement was removed using Personal assistant. After adequate curing of the cement, the tourniquet was deflated after a total tourniquet time of 77 minutes. Hemostasis was achieved using electrocautery.  The knee was irrigated with copious amounts of normal saline with antibiotic solution using pulsatile lavage and then suctioned dry. 20 mL of 1.3% Exparel and 60 mL of 0.25% Marcaine in 40 mL of normal saline was injected along the posterior capsule, medial and lateral gutters, and along the arthrotomy site. A 10 mm stabilized rotating platform polyethylene insert was inserted and the knee was placed through a range of motion with excellent mediolateral soft tissue balancing appreciated and excellent patellar tracking noted. 2 medium drains were placed in the wound bed and brought out through separate stab incisions. The medial parapatellar portion of the incision was reapproximated using interrupted sutures of #1 Vicryl. Subcutaneous tissue was approximated in layers using first #0 Vicryl followed #2-0 Vicryl. The skin was approximated with skin staples. A sterile dressing was applied.  The patient tolerated the procedure well and was transported to the recovery room in stable condition.    Gage Weant P. Angie Fava., M.D.

## 2020-01-12 NOTE — Evaluation (Signed)
Physical Therapy Evaluation Patient Details Name: [name] MRN: [id number] DOB: 03-18-1956 Today's Date: 01/12/2020   History of Present Illness  [name] is a 63oyM who comes to [facility] for elective Left TKA. Pt reports working up until recently. Pt reports losing about 40lbs over the past 4-5 months due to depression and poor appetite.  Clinical Impression  Pt admitted with above diagnosis. Pt currently with functional limitations due to the deficits listed below (see "PT Problem List"). Upon entry, pt in bed, awake and agreeable to participate. Pt is Spanish speaking, requires language interpreter. Pain meds coordinated prior to session at request of patient. The pt is alert and oriented x4, pleasant, conversational, and generally a good historian. HEP education and knee precuations education commenced. Bed mobility and transfers performed at supervision level. Pt able to AMB with RW for 57ft with slow, segmented gait, but has no LOB. Extension ROM is most limited at evaluation. Functional mobility assessment demonstrates increased effort/time requirements, poor tolerance, and need for physical assistance, whereas the patient performed these at a higher level of independence PTA. Pt will benefit from skilled PT intervention to increase independence and safety with basic mobility in preparation for discharge to the venue listed below.       Follow Up Recommendations Follow surgeon's recommendation for DC plan and follow-up therapies;Supervision - Intermittent    Equipment Recommendations  Rolling walker with 5" wheels;3in1 (PT)    Recommendations for Other Services       Precautions / Restrictions Precautions Precautions: Knee Precaution Booklet Issued: Yes (comment) Restrictions Weight Bearing Restrictions: Yes LLE Weight Bearing: Weight bearing as tolerated      Mobility  Bed Mobility Overal bed mobility: Modified Independent                Transfers Overall  transfer level: Modified independent Equipment used: Rolling walker (2 wheeled)             General transfer comment: heayv effort, no assist needed.  Ambulation/Gait   Gait Distance (Feet): 40 Feet Assistive device: Rolling walker (2 wheeled) Gait Pattern/deviations: Step-to pattern     General Gait Details: 3 point stp to pattern, cues to maintain walker close to body  Stairs            Wheelchair Mobility    Modified Rankin (Stroke Patients Only)       Balance                                             Pertinent Vitals/Pain Pain Assessment: 0-10 Pain Score: 5  Pain Location: surgical knee (9/10 prior to session prior to meds) Pain Descriptors / Indicators: Aching Pain Intervention(s): Limited activity within patient's tolerance;Monitored during session;Premedicated before session    Home Living Family/patient expects to be discharged to:: Private residence Living Arrangements: Spouse/significant other;Children Available Help at Discharge: Family Type of Home: House Home Access: Stairs to enter   Secretary/administrator of Steps: 1 Home Layout: One level Home Equipment: Environmental consultant - 2 wheels      Prior Function Level of Independence: Independent               Hand Dominance        Extremity/Trunk Assessment   Upper Extremity Assessment Upper Extremity Assessment: Overall WFL for tasks assessed    Lower Extremity Assessment Lower Extremity Assessment: Overall WFL for tasks assessed  Communication      Cognition Arousal/Alertness: Awake/alert Behavior During Therapy: WFL for tasks assessed/performed Overall Cognitive Status: Within Functional Limits for tasks assessed                                        General Comments      Exercises Total Joint Exercises Ankle Circles/Pumps: AROM;Both;20 reps;Supine Heel Slides: AAROM;Left;15 reps;Supine Hip ABduction/ADduction: AROM;Left;15  reps;Supine Goniometric ROM: 24-78 degrees Left knee flexion P/ROM   Assessment/Plan    PT Assessment Patient needs continued PT services  PT Problem List Decreased strength;Decreased range of motion;Decreased activity tolerance;Decreased balance;Decreased mobility;Decreased knowledge of use of DME;Decreased knowledge of precautions;Pain       PT Treatment Interventions DME instruction;Balance training;Gait training;Stair training;Functional mobility training;Therapeutic activities;Therapeutic exercise;Patient/family education    PT Goals (Current goals can be found in the Care Plan section)  Acute Rehab PT Goals Patient Stated Goal: improve ability to walk without pain PT Goal Formulation: With patient Time For Goal Achievement: 01/26/20 Potential to Achieve Goals: Good    Frequency BID   Barriers to discharge        Co-evaluation               AM-PAC PT "6 Clicks" Mobility  Outcome Measure Help needed turning from your back to your side while in a flat bed without using bedrails?: A Little Help needed moving from lying on your back to sitting on the side of a flat bed without using bedrails?: A Little Help needed moving to and from a bed to a chair (including a wheelchair)?: A Little Help needed standing up from a chair using your arms (e.g., wheelchair or bedside chair)?: A Little Help needed to walk in hospital room?: A Little Help needed climbing 3-5 steps with a railing? : A Little 6 Click Score: 18    End of Session Equipment Utilized During Treatment: Gait belt Activity Tolerance: Patient tolerated treatment well;No increased pain Patient left: in chair;with call bell/phone within reach;with chair alarm set Nurse Communication: Mobility status PT Visit Diagnosis: Unsteadiness on feet (R26.81);Other abnormalities of gait and mobility (R26.89);Difficulty in walking, not elsewhere classified (R26.2);Muscle weakness (generalized) (M62.81)    Time: 1829-9371 PT  Time Calculation (min) (ACUTE ONLY): 36 min   Charges:   PT Evaluation $PT Eval Low Complexity: 1 Low PT Treatments $Therapeutic Exercise: 8-22 mins        4:26 PM, 01/12/20 [name], PT, DPT Physical Therapist - [facility]  [phone] (ASCOM)   Edyn Popoca C 01/12/2020, 4:24 PM

## 2020-01-12 NOTE — Anesthesia Postprocedure Evaluation (Signed)
Anesthesia Post Note  Patient: [name]  Procedure(s) Performed: COMPUTER ASSISTED TOTAL KNEE ARTHROPLASTY (Left Knee)  Patient location during evaluation: PACU Anesthesia Type: Combined General/Spinal Level of consciousness: oriented and awake and alert Pain management: pain level controlled Vital Signs Assessment: post-procedure vital signs reviewed and stable Respiratory status: spontaneous breathing, respiratory function stable and patient connected to nasal cannula oxygen Cardiovascular status: blood pressure returned to baseline and stable Postop Assessment: no headache, no backache and no apparent nausea or vomiting Anesthetic complications: no   No complications documented.   Last Vitals:  Vitals:   01/12/20 1211 01/12/20 1252  BP: 94/69 100/73  Pulse: 79 66  Resp: 18 18  Temp: 36.4 C (!) 36.4 C  SpO2: 100% 100%    Last Pain:  Vitals:   01/12/20 1315  TempSrc:   PainSc: 0-No pain                 Corinda Gubler

## 2020-01-12 NOTE — Transfer of Care (Signed)
Immediate Anesthesia Transfer of Care Note  Patient: [name]  Procedure(s) Performed: COMPUTER ASSISTED TOTAL KNEE ARTHROPLASTY (Left Knee)  Patient Location: PACU  Anesthesia Type:General and Spinal  Level of Consciousness: awake, alert , oriented and patient cooperative  Airway & Oxygen Therapy: Patient Spontanous Breathing  Post-op Assessment: Report given to RN and Post -op Vital signs reviewed and stable  Post vital signs: Reviewed and stable  Last Vitals:  Vitals Value Taken Time  BP 95/71 01/12/20 1100  Temp 36 C 01/12/20 1100  Pulse 93 01/12/20 1101  Resp 26 01/12/20 1101  SpO2 98 % 01/12/20 1101    Last Pain:  Vitals:   01/12/20 0636  TempSrc: Oral  PainSc: 6          Complications: No complications documented.

## 2020-01-12 NOTE — Anesthesia Preprocedure Evaluation (Signed)
Anesthesia Evaluation  Patient identified by MRN, date of birth, ID band Patient awake    Reviewed: Allergy & Precautions, NPO status , Patient's Chart, lab work & pertinent test results  History of Anesthesia Complications Negative for: history of anesthetic complications  Airway Mallampati: I  TM Distance: >3 FB Neck ROM: Full    Dental  (+) Poor Dentition, Missing, Chipped, Dental Advisory Given   Pulmonary neg pulmonary ROS, neg sleep apnea, neg COPD, Patient abstained from smoking.Not current smoker, former smoker,    Pulmonary exam normal breath sounds clear to auscultation       Cardiovascular Exercise Tolerance: Good METS(-) hypertension(-) CAD and (-) Past MI negative cardio ROS  (-) dysrhythmias  Rhythm:Regular Rate:Normal - Systolic murmurs    Neuro/Psych negative neurological ROS  negative psych ROS   GI/Hepatic neg GERD  ,(+)     (-) substance abuse  ,   Endo/Other  neg diabetes  Renal/GU negative Renal ROS     Musculoskeletal  (+) Arthritis ,   Abdominal   Peds  Hematology   Anesthesia Other Findings History reviewed. No pertinent past medical history.  Reproductive/Obstetrics                             Anesthesia Physical Anesthesia Plan  ASA: II  Anesthesia Plan: General/Spinal   Post-op Pain Management:    Induction: Intravenous  PONV Risk Score and Plan: 3 and Ondansetron, Dexamethasone, Propofol infusion, TIVA and Midazolam  Airway Management Planned: Natural Airway  Additional Equipment: None  Intra-op Plan:   Post-operative Plan:   Informed Consent: I have reviewed the patients History and Physical, chart, labs and discussed the procedure including the risks, benefits and alternatives for the proposed anesthesia with the patient or authorized representative who has indicated his/her understanding and acceptance.     Interpreter used for  [employer] Discussed with: CRNA and Surgeon  Anesthesia Plan Comments: (Discussed R/B/A of neuraxial anesthesia technique with patient: - rare risks of spinal/epidural hematoma, nerve damage, infection - Risk of PDPH - Risk of nausea and vomiting - Risk of conversion to general anesthesia and its associated risks, including sore throat, damage to lips/teeth/oropharynx, and rare risks such as cardiac and respiratory events.  Patient voiced understanding.)        Anesthesia Quick Evaluation

## 2020-01-13 ENCOUNTER — Encounter: Payer: Self-pay | Admitting: Orthopedic Surgery

## 2020-01-13 MED ORDER — ENSURE ENLIVE PO LIQD
237.0000 mL | Freq: Two times a day (BID) | ORAL | Status: DC
  Administered 2020-01-13: 237 mL via ORAL

## 2020-01-13 NOTE — Progress Notes (Signed)
Initial Nutrition Assessment  DOCUMENTATION CODES:   Not applicable  INTERVENTION:  Provide Ensure Enlive po BID, each supplement provides 350 kcal and 20 grams of protein.  NUTRITION DIAGNOSIS:   Inadequate oral intake related to decreased appetite as evidenced by per patient/family report.  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   Met with patient at bedside with Spanish interpreter Leola Brazil Laukaitis). Patient reports he has had a decreased appetite and intake for the past 3-4 months. He reports it was related to pain in his knee and also nausea related to pain medications. He reports he could not eat any full meals and was mainly eating vegetables during that time, which led to weight loss. Patient reports his appetite is improving now. According to chart he ate 100% of his lunch yesterday and 70% of his dinner last night. He had finished about 50% of his lunch today at time of RD assessment and patient reported that was all he wanted. Patient is amenable to drinking Ensure Enlive to help meet calorie/protein needs.  Patient reports his UBW was 180 lbs and he had lost down to 150-160 lbs over the last several months. There is limited recent weight history in chart to trend. RD obtained bed scale weight of 74.5 kg today.  Medications reviewed and include: ferrous sulfate 325 mg BID, milk of magnesia 30 mL daily, Reglan 10 mg TID before meals and QHS, Protonix, senna-docusate 1 tablet BID.  Labs reviewed.  Discussed with RN. Discussed with MD via secure chat. Okay to order [employer].  Patient is at risk  for malnutrition.  NUTRITION - FOCUSED PHYSICAL EXAM:    Most Recent Value  Orbital Region No depletion  Upper Arm Region Mild depletion  Thoracic and Lumbar Region No depletion  Buccal Region No depletion  Temple Region Mild depletion  Clavicle Bone Region No depletion  Clavicle and Acromion Bone Region No depletion  Scapular Bone Region No depletion  Dorsal Hand No depletion  Patellar Region Mild depletion  Anterior Thigh Region Mild depletion  Posterior Calf Region Mild depletion  Edema (RD Assessment) None  Hair Reviewed  Eyes Reviewed  Mouth Reviewed  Skin Reviewed  Nails Reviewed     Diet Order:   Diet Order            Diet regular Room service appropriate? Yes; Fluid consistency: Thin  Diet effective now                EDUCATION NEEDS:   No education needs have been identified at this time  Skin:  Skin Assessment: Skin Integrity Issues: (closed incision left knee)  Last BM:  01/12/2020  Height:   Ht Readings from Last 1 Encounters:  01/12/20 5' 9"  (1.753 m)   Weight:   Wt Readings from Last 1 Encounters:  01/13/20 74.5 kg   BMI:  Body mass index is 24.25 kg/m.  Estimated Nutritional Needs:   Kcal:  1900-2100  Protein:  95-105 grams  Fluid:  1.9-2.1 L/day  Jacklynn Barnacle, MS, RD, LDN Pager number available on Amion

## 2020-01-13 NOTE — Evaluation (Signed)
Occupational Therapy Evaluation Patient Details Name: [name] MRN: [id number] DOB: [birth year] Today's Date: 01/13/2020    History of Present Illness [name] is a 63oyM who comes to [facility] for elective Left TKA. Pt reports working up until recently. Pt reports losing about 40lbs over the past 4-5 months due to depression and poor appetite.   Clinical Impression   Pt seen for OT evaluation this date, POD#1 from above surgery, premedicated before session and reporting no pain. Pt is Spanish speaking and an interpreter was used throughout session. Pt was independent in all ADL/IADL prior to surgery. 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 Pt describes himself as an active man and is eager to return to PLOF with less pain and improved safety and independence. Pt is currently Mod I for LB dressing, demonstrating ability to don/doff socks while seated without assist. He demonstrates safe use of RW and Mod I for bed mobility, transfers and functional mobility around the room. Pt instructed in polar care mgt, falls prevention strategies, routines modifications, safe DME use for ADL, and compression stocking mgt. He verbalized understanding throughout. Given pt functional performance and caregiver support at home, no further OT needs anticipated during hospital stay or upon discharge.     Follow Up Recommendations  No OT follow up    Equipment Recommendations  None recommended by OT    Recommendations for Other Services       Precautions / Restrictions Precautions Precautions: Knee Precaution Booklet Issued: No Restrictions Weight Bearing Restrictions: Yes LLE Weight Bearing: Weight bearing as tolerated Other Position/Activity Restrictions: wound vac in place      Mobility Bed Mobility Overal bed mobility: Modified Independent             General bed mobility comments: slightly  increased effort for lifting LLE,  repositioned self without assist or difficulty  Transfers Overall transfer level: Modified independent Equipment used: Rolling walker (2 wheeled)             General transfer comment: demonstrated good concentric and eccentric control in sit <> stand x2, RW used for support upon standing    Balance Overall balance assessment: Modified Independent                                         ADL either performed or assessed with clinical judgement   ADL Overall ADL's : Modified independent                                       General ADL Comments: Pt demonstrates ability to don/doff both socks and complete UB/LB dressing and bathing without physical assist or verbal cues. He used a RW during functional mobility with Mod I; Supervision provided per nature of evaluation.     Vision         Perception     Praxis      Pertinent Vitals/Pain Pain Assessment: No/denies pain Pain Intervention(s): Premedicated before session     Hand Dominance     Extremity/Trunk Assessment Upper Extremity Assessment Upper Extremity Assessment: Overall WFL for tasks assessed (grossly 5/5 BUE)   Lower Extremity Assessment Lower Extremity Assessment: Overall WFL for tasks assessed;LLE deficits/detail  LLE Deficits / Details: anticipated post-op deficits in strength/ROM LLE Sensation: WNL       Communication Communication Communication: Interpreter utilized   Cognition Arousal/Alertness: Awake/alert Behavior During Therapy: WFL for tasks assessed/performed Overall Cognitive Status: Within Functional Limits for tasks assessed                                     General Comments       Exercises Other Exercises Other Exercises: Educated pt re: the role of OT in recovery, adaptive strategies for ADL, falls prevention, compression stocking mgt and polar care mgt. Interpreter utilized and pt  verbalized/demonstrated understanding. Other Exercises: Used verbal and visual cues to facilitate safe and effective use of DME   Shoulder Instructions      Home Living Family/patient expects to be discharged to:: Private residence Living Arrangements: Spouse/significant other;Children (35 y/o son) Available Help at Discharge: Family Type of Home: House Home Access: Stairs to enter Secretary/administrator of Steps: 1   Home Layout: One level               Home Equipment: Environmental consultant - 2 wheels          Prior Functioning/Environment Level of Independence: Independent        Comments: pt is independent in ADL/IADL without AD, 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), is very active and 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        [employer] Problem List: Decreased strength;Decreased range of motion;Pain;Decreased knowledge of use of DME or AE      OT Treatment/Interventions:      OT Goals(Current goals can be found in the care plan section) Acute Rehab OT Goals Patient Stated Goal: To be able to go back to work OT Goal Formulation: All assessment and education complete, DC therapy  OT Frequency:     Barriers to D/C:            Co-evaluation              AM-PAC OT "6 Clicks" Daily Activity     Outcome Measure Help from another person eating meals?: None Help from another person taking care of personal grooming?: None Help from another person toileting, which includes using toliet, bedpan, or urinal?: None Help from another person bathing (including washing, rinsing, drying)?: A Little Help from another person to put on and taking off regular upper body clothing?: None Help from another person to put on and taking off regular lower body clothing?: None 6 Click Score: 23   End of Session Equipment Utilized During Treatment: Gait belt;Rolling walker Nurse Communication: Other (comment) (Premedicate before session)  Activity Tolerance: Patient  tolerated treatment well Patient left: in bed;with call bell/phone within reach;with bed alarm set;with SCD's reapplied  OT Visit Diagnosis: Other abnormalities of gait and mobility (R26.89);Pain Pain - Right/Left: Left Pain - part of body: Knee                Time: 3888-2800 OT Time Calculation (min): 33 min Charges:  OT General Charges $OT Visit: 1 Visit OT Evaluation $OT Eval Moderate Complexity: 1 Mod OT Treatments $Self Care/Home Management : 8-22 mins $Therapeutic Activity: 8-22 mins  Maurilio Lovely, OTS 01/13/20, 9:15 AM

## 2020-01-13 NOTE — Progress Notes (Signed)
Patients Hemovac is not working correctly reported by Shanon Ace, PT and Joni Reining , LPN .  The accordian is not held down by pressure at this time.

## 2020-01-13 NOTE — Progress Notes (Signed)
Physical Therapy Treatment Patient Details Name: [name] MRN: [id number] DOB: [birth year] Today's Date: 01/13/2020    History of Present Illness [name] is a 63oyM who comes to [facility] for elective Left TKA. Pt reports working up until recently. Pt reports losing about 40lbs over the past 4-5 months due to depression and poor appetite.    PT Comments    Pt received in bed, post OT eval and post pain meds, pain at 5/10 and pt agreeable to participate. Reviewed HEP with patient in full with handout. Reviewed knee precautions regarding avoiding pillow under knee. Pt able to perform all basic mobility and AMB at supervision level or better, AMB c RW >235ft without a symmetrical step-through gait. Pt will need to do stairs training prior to DC.    Follow Up Recommendations  Follow surgeon's recommendation for DC plan and follow-up therapies;Supervision - Intermittent     Equipment Recommendations  Rolling walker with 5" wheels;3in1 (PT)    Recommendations for Other Services       Precautions / Restrictions Precautions Precautions: Knee Precaution Booklet Issued: No Restrictions Weight Bearing Restrictions: Yes LLE Weight Bearing: Weight bearing as tolerated Other Position/Activity Restrictions: wound vac in place    Mobility  Bed Mobility Overal bed mobility: Modified Independent             General bed mobility comments: slightly increased effort for lifting LLE,  repositioned self without assist or difficulty  Transfers Overall transfer level: Modified independent Equipment used: Rolling walker (2 wheeled)             General transfer comment: demonstrated good concentric and eccentric control in sit <> stand x2, RW used for support upon standing  Ambulation/Gait Ambulation/Gait assistance: Supervision Gait Distance (Feet): 240 Feet Assistive device: Rolling walker (2 wheeled) Gait Pattern/deviations: Step-through pattern Gait velocity:  0.29m/s Gait velocity interpretation: <1.31 ft/sec, indicative of household ambulator General Gait Details: able to perform 2-point continuous push RW gait   Stairs             Wheelchair Mobility    Modified Rankin (Stroke Patients Only)       Balance Overall balance assessment: Modified Independent                                          Cognition Arousal/Alertness: Awake/alert Behavior During Therapy: WFL for tasks assessed/performed Overall Cognitive Status: Within Functional Limits for tasks assessed                                        Exercises Total Joint Exercises Ankle Circles/Pumps: AROM;Both;20 reps;Supine Goniometric ROM: Left knee P/ROM 22-77 degrees General Exercises - Lower Extremity Ankle Circles/Pumps: AROM;Both;15 reps;Supine Quad Sets: AROM;Left;15 reps;Supine Short Arc Quad: AROM;Left;15 reps;Supine Heel Slides: AAROM;Left;15 reps;Supine;Limitations Heel Slides Limitations: education on self assist with gait belt Hip ABduction/ADduction: AROM;Left;Supine Other Exercises Other Exercises: Educated pt re: the role of OT in recovery, adaptive strategies for ADL, falls prevention, compression stocking mgt and polar care mgt. Interpreter utilized and pt verbalized/demonstrated understanding. Other Exercises: Used verbal and visual cues to facilitate safe and effective use of DME    General Comments        Pertinent Vitals/Pain Pain Assessment: 0-10 Pain Score: 5  Pain Descriptors / Indicators: Aching Pain Intervention(s): Limited activity  within patient's tolerance;Monitored during session;Premedicated before session;Repositioned    Home Living Family/patient expects to be discharged to:: Private residence Living Arrangements: Spouse/significant other;Children (35 y/o son) Available Help at Discharge: Family Type of Home: House Home Access: Stairs to enter   Home Layout: One level Home Equipment:  Environmental consultant - 2 wheels      Prior Function Level of Independence: Independent      Comments: pt is independent in ADL/IADL without AD, 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), is very active and 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   [employer] (current goals can now be found in the care plan section) Acute Rehab PT Goals Patient Stated Goal: To be able to go back to work PT Goal Formulation: With patient Time For Goal Achievement: 01/26/20 Potential to Achieve Goals: Good Progress towards PT goals: Progressing toward goals    Frequency    BID      PT Plan Current plan remains appropriate    Co-evaluation              AM-PAC PT "6 Clicks" Mobility   Outcome Measure  Help needed turning from your back to your side while in a flat bed without using bedrails?: A Little Help needed moving from lying on your back to sitting on the side of a flat bed without using bedrails?: A Little Help needed moving to and from a bed to a chair (including a wheelchair)?: A Little Help needed standing up from a chair using your arms (e.g., wheelchair or bedside chair)?: A Little Help needed to walk in hospital room?: A Little Help needed climbing 3-5 steps with a railing? : A Little 6 Click Score: 18    End of Session Equipment Utilized During Treatment: Gait belt Activity Tolerance: Patient tolerated treatment well;No increased pain Patient left: in chair;with call bell/phone within reach;with chair alarm set Nurse Communication: Mobility status PT Visit Diagnosis: Unsteadiness on feet (R26.81);Other abnormalities of gait and mobility (R26.89);Difficulty in walking, not elsewhere classified (R26.2);Muscle weakness (generalized) (M62.81)     Time: 0932-6712 PT Time Calculation (min) (ACUTE ONLY): 32 min  Charges:  $Gait Training: 8-22 mins $Therapeutic Exercise: 8-22 mins                     11:39 AM, 01/13/20 [name], PT, DPT Physical Therapist - [facility]  [phone] (ASCOM)    Evander Macaraeg C 01/13/2020, 11:34 AM

## 2020-01-13 NOTE — Plan of Care (Signed)

## 2020-01-13 NOTE — TOC Progression Note (Signed)
Transition of Care [facility]) - Progression Note    Patient Details  Name: [name] MRN: [id number] Date of Birth: July 10, 1956  Transition of Care [facility]) CM/SW Contact  Eilleen Kempf, LCSW Phone Number: 01/13/2020, 10:24 AM  Clinical Narrative:    CSW called [employer] for arrnagement of DME upon discharge. Spoke to [place] agreed to provide 5 wheel walker and 3&1        Expected Discharge Plan and Services                                                 Social Determinants of Health (SDOH) Interventions    Readmission Risk Interventions No flowsheet data found.

## 2020-01-13 NOTE — Progress Notes (Signed)
Physical Therapy Treatment Patient Details Name: [name] MRN: [id number] DOB: [birth year] Today's Date: 01/13/2020    History of Present Illness [name] is a 63oyM who comes to [facility] for elective Left TKA. Pt reports working up until recently. Pt reports losing about 40lbs over the past 4-5 months due to depression and poor appetite.    PT Comments    Pt was long sitting in bed upon arriving finishing lunch. Spanish interpreter used throughout session. Pt was very cooperative and motivated. Was able to exit bed without assistance and stand to RW without difficulty. Ambulated 200 ft with only 1 cue for posture correction. Demonstrated safe ability to ascend/descend 4 stair with +1 rail + step to pattern. Demonstrates safe ability to ambulate and perform stairs without difficulty. He reports he feels confident he can perform at home safely. Once pt returned to room, therapist reviewed and pt performed HEP exercises without assistance. Overall pt is progressing well with all acute PT goals.  He lives with family and feels safe to DC home when medically released. Acute PT will continue to follow per POC and progress as able. Follow surgeons recommendations for skilled PT after DC from acute hospital.     Follow Up Recommendations  Follow surgeon's recommendation for DC plan and follow-up therapies;Supervision - Intermittent     Equipment Recommendations  Rolling walker with 5" wheels;3in1 (PT)    Recommendations for Other Services       Precautions / Restrictions Precautions Precautions: Knee Precaution Booklet Issued: Yes (comment) Restrictions Weight Bearing Restrictions: Yes LLE Weight Bearing: Weight bearing as tolerated Other Position/Activity Restrictions: wound vac in place    Mobility  Bed Mobility Overal bed mobility: Modified Independent                Transfers Overall transfer level: Modified independent Equipment used: Rolling walker (2  wheeled)                Ambulation/Gait Ambulation/Gait assistance: Supervision Gait Distance (Feet): 200 Feet Assistive device: Rolling walker (2 wheeled) Gait Pattern/deviations: Step-through pattern Gait velocity: WNL Gait velocity interpretation: <1.31 ft/sec, indicative of household ambulator General Gait Details: pt demonstrates safe steady gait kinematics without LOB or unsteadiness. Vcs only to stay inside RW for improve posture   Stairs Stairs: Yes Stairs assistance: Supervision Stair Management: Step to pattern;Two rails Number of Stairs: 4 General stair comments: Pt demonstarted safe ability to ascend/descend 4 stair without difficulty. Reports he feels safe he can perform at home without difficulty   Wheelchair Mobility    Modified Rankin (Stroke Patients Only)       Balance Overall balance assessment: Modified Independent                                          Cognition Arousal/Alertness: Awake/alert Behavior During Therapy: WFL for tasks assessed/performed Overall Cognitive Status: Within Functional Limits for tasks assessed                                 General Comments: Pt is A and O x 4 and agreeable and cooperative throughout      Exercises Total Joint Exercises Ankle Circles/Pumps: AROM;Both;20 reps;Supine Goniometric ROM: Left knee P/ROM 22-77 degrees General Exercises - Lower Extremity Ankle Circles/Pumps: AROM;Both;15 reps;Supine Quad Sets: AROM;Left;15 reps;Supine Short Arc Quad: AROM;Left;15 reps;Supine Heel  Slides: AAROM;Left;15 reps;Supine;Limitations Heel Slides Limitations: education on self assist with gait belt Hip ABduction/ADduction: AROM;Left;Supine Other Exercises Other Exercises: Therapist reviewed HEP with pt and pt demonstrated ability to perform I'ly. Drain has air leak and RN notified     General Comments        Pertinent Vitals/Pain Pain Assessment: 0-10 Pain Score: 2  Pain  Location: knee Pain Descriptors / Indicators: Aching;Sore Pain Intervention(s): Limited activity within patient's tolerance;Monitored during session;Premedicated before session;Repositioned    Home Living                      Prior Function            PT Goals (current goals can now be found in the care plan section) Acute Rehab PT Goals Patient Stated Goal: To be able to go back to work PT Goal Formulation: With patient Time For Goal Achievement: 01/26/20 Potential to Achieve Goals: Good Progress towards PT goals: Progressing toward goals    Frequency    BID      PT Plan Current plan remains appropriate    Co-evaluation              AM-PAC PT "6 Clicks" Mobility   Outcome Measure  Help needed turning from your back to your side while in a flat bed without using bedrails?: None Help needed moving from lying on your back to sitting on the side of a flat bed without using bedrails?: None Help needed moving to and from a bed to a chair (including a wheelchair)?: None Help needed standing up from a chair using your arms (e.g., wheelchair or bedside chair)?: A Little Help needed to walk in hospital room?: A Little Help needed climbing 3-5 steps with a railing? : A Little 6 Click Score: 21    End of Session Equipment Utilized During Treatment: Gait belt Activity Tolerance: Patient tolerated treatment well;No increased pain Patient left: in bed;with call bell/phone within reach;with bed alarm set;with nursing/sitter in room Nurse Communication: Mobility status PT Visit Diagnosis: Unsteadiness on feet (R26.81);Other abnormalities of gait and mobility (R26.89);Difficulty in walking, not elsewhere classified (R26.2);Muscle weakness (generalized) (M62.81)     Time: 1310-1336 PT Time Calculation (min) (ACUTE ONLY): 26 min  Charges:  $Gait Training: 8-22 mins $Therapeutic Exercise: 8-22 mins                     Jetta Lout PTA 01/13/20, 1:52 PM

## 2020-01-13 NOTE — Progress Notes (Signed)
ORTHOPAEDICS PROGRESS NOTE  PATIENT NAME: [name] DOB: 04-11-1956  MRN: [id number]  POD # 1: Left total knee arthroplasty  Subjective: The patient is up in a chair this morning.  Pain has been under good control.  Objective: Vital signs in last 24 hours: Temp:  [96.8 F (36 C)-97.7 F (36.5 C)] 97.7 F (36.5 C) (07/01 0446) Pulse Rate:  [66-94] 70 (07/01 0446) Resp:  [5-26] 16 (07/01 0446) BP: (92-127)/(67-86) 122/82 (07/01 0446) SpO2:  [96 %-100 %] 98 % (07/01 0446)  Intake/Output from previous day: 06/30 0701 - 07/01 0700 In: 2467.1 [P.O.:730; I.V.:1237.1; IV Piggyback:500] Out: 3825 [Urine:3575; Drains:200; Blood:50]  No results for input(s): WBC, HGB, HCT, PLT, K, CL, CO2, BUN, CREATININE, GLUCOSE, CALCIUM, LABPT, INR in the last 72 hours.  EXAM General: Well-developed well-nourished male seen in no apparent discomfort. Lungs: clear to auscultation Cardiac: normal rate and regular rhythm Abdomen: Soft, nontender, nondistended.  Bowel sounds are present. Left lower extremity: Dressing is dry and intact.  Polar Care and Hemovac are intact and functioning.  The patient is able to perform an independent straight leg raise.  He is maintaining the knee in slight flexion. Neurologic: Awake, alert, and oriented.  Sensory and motor function are grossly intact.  Assessment: Left total knee arthroplasty  Secondary diagnoses: Bilateral lower extremity varicose veins  Plan: Notes from physical therapy were reviewed.  Continue with physical therapy and Occupational Therapy as per total knee arthroplasty rehab protocol.  Emphasis on knee extension. Plan is to go Home after hospital stay. DVT Prophylaxis - Lovenox, Foot Pumps and TED hose  Aeriel Boulay P. Angie Fava M.D.

## 2020-01-13 NOTE — Plan of Care (Signed)
Polar Care working but without ice. Ice refilled. Baldwin Jamaica, PA-C

## 2020-01-14 MED ORDER — ENOXAPARIN SODIUM 40 MG/0.4ML ~~LOC~~ SOLN
40.0000 mg | SUBCUTANEOUS | 0 refills | Status: DC
Start: 2020-01-14 — End: 2020-06-23

## 2020-01-14 MED ORDER — TRAMADOL HCL 50 MG PO TABS: 50.0000 mg | ORAL_TABLET | ORAL | 0 refills | Status: DC | PRN

## 2020-01-14 MED ORDER — OXYCODONE HCL 5 MG PO TABS: 5.0000 mg | ORAL_TABLET | ORAL | 0 refills | Status: DC | PRN

## 2020-01-14 MED ORDER — CELECOXIB 200 MG PO CAPS: 200.0000 mg | ORAL_CAPSULE | Freq: Two times a day (BID) | ORAL | 0 refills | Status: DC

## 2020-01-14 NOTE — TOC Benefit Eligibility Note (Signed)
Transition of Care [facility]) Benefit Eligibility Note    Patient Details  Name: [name] MRN: [id number] Date of Birth: May 03, 1956   Medication/Dose: Enoxaparin 40 mg daily x 14 days  Covered?: Yes (Generic - Enoxaparin)  Tier: 2 Drug  Prescription Coverage Preferred Pharmacy: [facility] with Person/Company/Phone Number:: [facility] [place]  Co-Pay: $10             Verita Schneiders Viraj Liby Phone Number: 01/14/2020, 12:47 PM

## 2020-01-14 NOTE — Discharge Summary (Signed)
Physician Discharge Summary  Patient ID: Angola [name] MRN: [id number] DOB/AGE: 07/24/1955 64 y.o.  Admit date: 01/12/2020 Discharge date: 01/14/2020  Admission Diagnoses:  Total knee replacement status [Z96.659]  Surgeries:Procedure(s):  Left total knee arthroplasty using computer-assisted navigation  SURGEON:  Jena Gauss. M.D.  ASSISTANT: Baldwin Jamaica, PA-C (present and scrubbed throughout the case, critical for assistance with exposure, retraction, instrumentation, and closure)  ANESTHESIA: spinal  ESTIMATED BLOOD LOSS: 50 mL  FLUIDS REPLACED: 1200 mL of crystalloid  TOURNIQUET TIME: 77 minutes  DRAINS: 2 medium Hemovac drains  SOFT TISSUE RELEASES: Anterior cruciate ligament, posterior cruciate ligament, deep medial collateral ligament, patellofemoral ligament  IMPLANTS UTILIZED: DePuy Attune size 7 posterior stabilized femoral component (cemented), size 7 rotating platform tibial component (cemented), 38 mm medialized dome patella (cemented), and a 10 mm stabilized rotating platform polyethylene insert.  Discharge Diagnoses: Patient Active Problem List   Diagnosis Date Noted  . Total knee replacement status 01/12/2020  . Primary osteoarthritis of both knees 11/07/2019  . Varicose veins of lower extremities with ulcer (HCC) 01/23/2018  . Lower limb ulcer, ankle, left, with fat layer exposed (HCC) 01/23/2018  . Swelling of limb 01/23/2018  . Distal radius fracture 07/01/2013  . Pelvic fracture (HCC) 07/01/2013    History reviewed. No pertinent past medical history.   Transfusion: n/a   Consultants (if any):   Discharged Condition: Improved  Hospital Course: Angola Kahron Kauth is an 64 y.o. male who was admitted 01/12/2020 with a diagnosis of left knee osteoarthritis and went to the operating room on 01/12/2020 and underwent left total knee arthroplasty. The patient received perioperative antibiotics for prophylaxis (see below). The patient  tolerated the procedure well and was transported to PACU in stable condition. After meeting PACU criteria, the patient was subsequently transferred to the Orthopaedics/Rehabilitation unit.   The patient received DVT prophylaxis in the form of early mobilization, Lovenox, Foot Pumps and TED hose. A sacral pad had been placed and heels were elevated off of the bed with rolled towels in order to protect skin integrity. Foley catheter was discontinued on postoperative day #0. Wound drains were discontinued on postoperative day #2. The surgical incision was healing well without signs of infection.  Physical therapy was initiated postoperatively for transfers, gait training, and strengthening. Occupational therapy was initiated for activities of daily living and evaluation for assisted devices. Rehabilitation goals were reviewed in detail with the patient. The patient made steady progress with physical therapy and physical therapy recommended discharge to Home.   The patient achieved the preliminary goals of this hospitalization and was felt to be medically and orthopaedically appropriate for discharge.  He was given perioperative antibiotics:  Anti-infectives (From admission, onward)   Start     Dose/Rate Route Frequency Ordered Stop   01/12/20 1300  ceFAZolin (ANCEF) IVPB 2g/100 mL premix        2 g 200 mL/hr over 30 Minutes Intravenous Every 6 hours 01/12/20 1214 01/13/20 0624   01/12/20 0624  ceFAZolin (ANCEF) 2-4 GM/100ML-% IVPB       Note to Pharmacy: Mike Craze   : cabinet override      01/12/20 0624 01/13/20 0158   01/12/20 0600  ceFAZolin (ANCEF) IVPB 2g/100 mL premix        2 g 200 mL/hr over 30 Minutes Intravenous On call to O.R. 01/12/20 0205 01/12/20 0720    .  Recent vital signs:  Vitals:   01/13/20 1621 01/14/20 0033  BP: 121/78 108/76  Pulse: 75  70  Resp: 18 18  Temp: 97.6 F (36.4 C) 98 F (36.7 C)  SpO2: 100% 99%    Recent laboratory studies:  No results for  input(s): WBC, HGB, HCT, PLT, K, CL, CO2, BUN, CREATININE, GLUCOSE, CALCIUM, LABPT, INR in the last 72 hours.  Diagnostic Studies: DG Knee Left Port  Result Date: 01/12/2020 CLINICAL DATA:  Knee replacement EXAM: PORTABLE LEFT KNEE - 1-2 VIEW COMPARISON:  None. FINDINGS: Post left total knee arthroplasty. No evidence of hardware complication. Surgical drain is present. There is postoperative soft tissue air and swelling. IMPRESSION: Standard postoperative appearance of total left knee arthroplasty. Electronically Signed   By: Guadlupe Spanish M.D.   On: 01/12/2020 11:35    Discharge Medications:   Allergies as of 01/14/2020   No Known Allergies     Medication List    STOP taking these medications   Motrin IB 200 MG tablet Generic drug: ibuprofen     TAKE these medications   celecoxib 200 MG capsule Commonly known as: CELEBREX Take 1 capsule (200 mg total) by mouth 2 (two) times daily.   enoxaparin 40 MG/0.4ML injection Commonly known as: LOVENOX Inject 0.4 mLs (40 mg total) into the skin daily for 14 days.   oxyCODONE 5 MG immediate release tablet Commonly known as: Oxy IR/ROXICODONE Take 1 tablet (5 mg total) by mouth every 4 (four) hours as needed for moderate pain (pain score 4-6).   traMADol 50 MG tablet Commonly known as: ULTRAM Take 1 tablet (50 mg total) by mouth every 4 (four) hours as needed for moderate pain.            Durable Medical Equipment  (From admission, onward)         Start     Ordered   01/12/20 1215  DME Walker rolling  Once       Question:  Patient needs a walker to treat with the following condition  Answer:  Total knee replacement status   01/12/20 1214   01/12/20 1215  DME Bedside commode  Once       Question:  Patient needs a bedside commode to treat with the following condition  Answer:  Total knee replacement status   01/12/20 1214          Disposition: home with home health PT      Follow-up Information    Myrtis Ser On 01/27/2020.   Specialty: Orthopedic Surgery Why: at 9:15am Contact information: [address] [facility] [facility] [place] [address] [phone]        Donato Heinz, MD On 02/24/2020.   Specialty: Orthopedic Surgery Why: at 2:00pm Contact information: [address] [facility] [address] [facility] [place] [address] [phone]                Lasandra Beech, PA-C 01/14/2020, 7:28 AM

## 2020-01-14 NOTE — TOC Transition Note (Signed)
Transition of Care [facility]) - CM/SW Discharge Note   Patient Details  Name: [name] MRN: [id number] Date of Birth: [birth year]  Transition of Care [facility]) CM/SW Contact:  Su Hilt, RN Phone Number: 01/14/2020, 10:01 AM   Clinical Narrative:    Met with the patient at Bedside to discuss DC plan and needs He lives at home with his wife and adult son, He is set [facility] with [facility] PT and OT He has a 3 in 1 and a rolling walker in the room provided by [employer] to take home  I requested the price of Lovenox 40 mg daily and will notfiy him once the price is obtained He has transportation with family  He is able to afford his medications, he is up to date with his PCP   Final next level of care: Home w Home Health Services Barriers to Discharge: Barriers Resolved   Patient Goals and CMS Choice Patient states their goals for this hospitalization and ongoing recovery are:: get better      Discharge Placement                       Discharge Plan and Services   Discharge Planning Services: CM Consult            DME Arranged: 3-N-1, Walker rolling DME Agency: [employer] Date DME Agency Contacted: 01/14/20 Time DME Agency Contacted: [phone] Representative spoke with at DME Agency: [facility] Arranged: PT, OT [place] Agency: [facility] (formerly [employer]) Date [place]: 01/14/20 Time [place]: [phone] Representative spoke with at [place]: [place] ([place]) Interventions     Readmission Risk Interventions No flowsheet data found.

## 2020-01-14 NOTE — Progress Notes (Signed)
  Subjective: 2 Days Post-Op Procedure(s) (LRB): COMPUTER ASSISTED TOTAL KNEE ARTHROPLASTY (Left) Patient reports pain as moderate.   Patient is well, and has had no acute complaints or problems Plan is to go Home after hospital stay. Negative for chest pain and shortness of breath Fever: no Gastrointestinal: negative for nausea and vomiting.  Patient has not had a bowel movement.  Objective: Vital signs in last 24 hours: Temp:  [97.6 F (36.4 C)-98.1 F (36.7 C)] 98 F (36.7 C) (07/02 0033) Pulse Rate:  [61-75] 70 (07/02 0033) Resp:  [16-18] 18 (07/02 0033) BP: (108-121)/(75-78) 108/76 (07/02 0033) SpO2:  [99 %-100 %] 99 % (07/02 0033) Weight:  [74.5 kg] 74.5 kg (07/01 1332)  Intake/Output from previous day:  Intake/Output Summary (Last 24 hours) at 01/14/2020 0725 Last data filed at 01/14/2020 0602 Gross per 24 hour  Intake --  Output 2200 ml  Net -2200 ml    Intake/Output this shift: No intake/output data recorded.  Labs: No results for input(s): HGB in the last 72 hours. No results for input(s): WBC, RBC, HCT, PLT in the last 72 hours. No results for input(s): NA, K, CL, CO2, BUN, CREATININE, GLUCOSE, CALCIUM in the last 72 hours. No results for input(s): LABPT, INR in the last 72 hours.   EXAM General - Patient is Alert, Appropriate and Oriented Extremity - Neurovascular intact Dorsiflexion/Plantar flexion intact Compartment soft Dressing/Incision -Postoperative dressing remains in place., Polar Care in place and working. , Hemovac in place. Following removal of post-op dressing, minimal sanguinous drainage noted. Motor Function - intact, moving foot and toes well on exam.  Cardiovascular- Regular rate and rhythm, no murmurs/rubs/gallops Respiratory- Lungs clear to auscultation bilaterally Gastrointestinal- soft, nontender and active bowel sounds   Assessment/Plan: 2 Days Post-Op Procedure(s) (LRB): COMPUTER ASSISTED TOTAL KNEE ARTHROPLASTY (Left) Active  Problems:   Total knee replacement status  Estimated body mass index is 24.25 kg/m as calculated from the following:   Height as of this encounter: 5\' 9"  (1.753 m).   Weight as of this encounter: 74.5 kg. Advance diet Up with therapy Discharge home with home health  Hemovac removed. Bone Foam placed under heel.  DVT Prophylaxis - Lovenox, Ted hose and foot pumps Weight-Bearing as tolerated to left leg  , PA-C [facility] Orthopaedic Surgery 01/14/2020, 7:25 AM

## 2020-01-14 NOTE — Progress Notes (Signed)
Physical Therapy Treatment Patient Details Name: [name] MRN: [id number] DOB: 20-Jan-1956 Today's Date: 01/14/2020    History of Present Illness [name] is a 63oyM who comes to [facility] for elective Left TKA. Pt reports working up until recently. Pt reports losing about 40lbs over the past 4-5 months due to depression and poor appetite.    PT Comments    Reviewed HEP in full, pt able to complete with physical assist. Pt educated extensively about maintaining TKE stretch at rest and avoiding placement of items beneath knee (handout given with pictures, words in Spanish). Stairs performed yesterday with success.  Pt able to perform all mobility at supervision level or better. Pt ready for DC from PT perspective.      Follow Up Recommendations  Follow surgeon's recommendation for DC plan and follow-up therapies;Supervision - Intermittent     Equipment Recommendations  Rolling walker with 5" wheels;3in1 (PT)    Recommendations for Other Services       Precautions / Restrictions Precautions Precautions: Knee Precaution Booklet Issued: Yes (comment) Restrictions LLE Weight Bearing: Weight bearing as tolerated    Mobility  Bed Mobility Overal bed mobility: Modified Independent                Transfers Overall transfer level: Modified independent                  Ambulation/Gait Ambulation/Gait assistance: Supervision Gait Distance (Feet): 240 Feet Assistive device: Rolling walker (2 wheeled) Gait Pattern/deviations: Step-through pattern;WFL(Within Functional Limits) Gait velocity: 0.68m/s       Stairs             Wheelchair Mobility    Modified Rankin (Stroke Patients Only)       Balance Overall balance assessment: Modified Independent                                          Cognition Arousal/Alertness: Awake/alert Behavior During Therapy: WFL for tasks assessed/performed Overall Cognitive Status: Within  Functional Limits for tasks assessed                                        Exercises Total Joint Exercises Ankle Circles/Pumps: AROM;Both;20 reps;Supine Short Arc Quad: AROM;15 reps;Supine Heel Slides: AAROM;Left;15 reps;Supine Hip ABduction/ADduction: AROM;Left;15 reps;Supine Straight Leg Raises: 10 reps;AROM;Supine;Left Long Arc Quad: AROM;Left;10 reps;Supine Knee Flexion: AROM;Left;10 reps;Supine Goniometric ROM: Left knee P/ROM 18-83 degrees Left knee flexion.    General Comments        Pertinent Vitals/Pain Pain Assessment: 0-10 Pain Score: 6  Pain Location: knee Pain Descriptors / Indicators: Aching;Sore Pain Intervention(s): Limited activity within patient's tolerance;Monitored during session;Premedicated before session    Home Living                      Prior Function            PT Goals (current goals can now be found in the care plan section) Acute Rehab PT Goals Patient Stated Goal: To be able to go back to work PT Goal Formulation: With patient Time For Goal Achievement: 01/26/20 Potential to Achieve Goals: Good Progress towards PT goals: Progressing toward goals    Frequency    BID      PT Plan Current plan remains appropriate  Co-evaluation              AM-PAC PT "6 Clicks" Mobility   Outcome Measure  Help needed turning from your back to your side while in a flat bed without using bedrails?: None Help needed moving from lying on your back to sitting on the side of a flat bed without using bedrails?: None Help needed moving to and from a bed to a chair (including a wheelchair)?: None Help needed standing up from a chair using your arms (e.g., wheelchair or bedside chair)?: None Help needed to walk in hospital room?: A Little Help needed climbing 3-5 steps with a railing? : A Little 6 Click Score: 22    End of Session Equipment Utilized During Treatment: Gait belt Activity Tolerance: Patient tolerated  treatment well;No increased pain Patient left: with call bell/phone within reach;in chair Nurse Communication: Mobility status PT Visit Diagnosis: Unsteadiness on feet (R26.81);Other abnormalities of gait and mobility (R26.89);Difficulty in walking, not elsewhere classified (R26.2);Muscle weakness (generalized) (M62.81)     Time: 2353-6144 PT Time Calculation (min) (ACUTE ONLY): 31 min  Charges:  $Gait Training: 8-22 mins $Therapeutic Exercise: 8-22 mins                     12:08 PM, 01/14/20 [name], PT, DPT Physical Therapist - [facility]  [phone] (ASCOM)     [name] 01/14/2020, 12:01 PM

## 2020-06-25 NOTE — Discharge Instructions (Signed)
Instructions after Total Knee Replacement   [name], Jr., M.D.     Dept. of Orthopaedics & Sports Medicine  [facility]  [address]  [place], [place]  [address]  Phone: [phone]   Fax: [phone]    DIET: Drink plenty of non-alcoholic fluids. Resume your normal diet. Include foods high in fiber.  ACTIVITY:  You may use crutches or a walker with weight-bearing as tolerated, unless instructed otherwise. You may be weaned off of the walker or crutches by your Physical Therapist.  Do NOT place pillows under the knee. Anything placed under the knee could limit your ability to straighten the knee.   Continue doing gentle exercises. Exercising will reduce the pain and swelling, increase motion, and prevent muscle weakness.   Please continue to use the TED compression stockings for 6 weeks. You may remove the stockings at night, but should reapply them in the morning. Do not drive or operate any equipment until instructed.  WOUND CARE:  Continue to use the PolarCare or ice packs periodically to reduce pain and swelling. You may bathe or shower after the staples are removed at the first office visit following surgery.  MEDICATIONS: You may resume your regular medications. Please take the pain medication as prescribed on the medication. Do not take pain medication on an empty stomach. You have been given a prescription for a blood thinner (Lovenox or Coumadin). Please take the medication as instructed. (NOTE: After completing a 2 week course of Lovenox, take one Enteric-coated aspirin once a day. This along with elevation will help reduce the possibility of phlebitis in your operated leg.) Do not drive or drink alcoholic beverages when taking pain medications.  CALL THE OFFICE FOR: Temperature above 101 degrees Excessive bleeding or drainage on the dressing. Excessive swelling, coldness, or paleness of the toes. Persistent nausea and vomiting.  FOLLOW-UP:  You  should have an appointment to return to the office in 10-14 days after surgery. Arrangements have been made for continuation of Physical Therapy (either home therapy or outpatient therapy).   [facility] Department Directory         [email or web address]       [email or web address]          Cardiology  Appointments: [place] - [phone] [place] - [phone]  Endocrinology  Appointments: [place] - [phone] [place] - [phone]  Gastroenterology  Appointments: [place] - [phone] [place] - [phone]        General Surgery   Appointments: [place] - [phone]  Internal Medicine/Family Medicine  Appointments: [place] - [phone] [place] - [phone] [place] - [phone]  Metabolic and Weigh Loss Surgery  Appointments: [place] - [phone]        Neurology  Appointments: [place] - [phone] [place] - [phone]  Neurosurgery  Appointments: [place] - [phone]  Obstetrics & Gynecology  Appointments: [place] - [phone] [place] - [phone]        Pediatrics  Appointments: [place] - [phone] [place] - [phone]  Physiatry  Appointments: [place] -[phone]  Physical Therapy  Appointments: [place] - [phone] [place] - [phone]        Podiatry  Appointments: [place] - [phone] [place] - [phone]  Pulmonology  Appointments: [place] - [phone]  Rheumatology  Appointments: [place] - [phone]        [place] Location: [facility]  [address] , [place]  [address]  [place] Location: [facility] [address] [place], [place]  [address]  [place] Location: [facility] [address] [place], [place]  [address]    

## 2020-06-26 ENCOUNTER — Encounter
Admission: RE | Admit: 2020-06-26 | Discharge: 2020-06-26 | Disposition: A | Discharge status: Home / Self Care | Payer: BC Managed Care – PPO | Source: Ambulatory Visit | Attending: Orthopedic Surgery | Admitting: Orthopedic Surgery

## 2020-06-26 ENCOUNTER — Other Ambulatory Visit: Payer: Self-pay

## 2020-06-26 DIAGNOSIS — Z01812 Encounter for preprocedural laboratory examination: Secondary | ICD-10-CM | POA: Insufficient documentation

## 2020-06-26 LAB — CBC
HCT: 39.2 % (ref 39.0–52.0)
Hemoglobin: 12.8 g/dL — ABNORMAL LOW (ref 13.0–17.0)
MCH: 25.8 pg — ABNORMAL LOW (ref 26.0–34.0)
MCHC: 32.7 g/dL (ref 30.0–36.0)
MCV: 79 fL — ABNORMAL LOW (ref 80.0–100.0)
Platelets: 325 10*3/uL (ref 150–400)
RBC: 4.96 MIL/uL (ref 4.22–5.81)
RDW: 15.8 % — ABNORMAL HIGH (ref 11.5–15.5)
WBC: 7.3 10*3/uL (ref 4.0–10.5)
nRBC: 0 % (ref 0.0–0.2)

## 2020-06-26 LAB — URINALYSIS, ROUTINE W REFLEX MICROSCOPIC
Bilirubin Urine: NEGATIVE
Glucose, UA: NEGATIVE mg/dL
Hgb urine dipstick: NEGATIVE
Ketones, ur: NEGATIVE mg/dL
Leukocytes,Ua: NEGATIVE
Nitrite: NEGATIVE
Protein, ur: NEGATIVE mg/dL
Specific Gravity, Urine: 1.009 (ref 1.005–1.030)
pH: 6 (ref 5.0–8.0)

## 2020-06-26 LAB — COMPREHENSIVE METABOLIC PANEL
ALT: 15 U/L (ref 0–44)
AST: 18 U/L (ref 15–41)
Albumin: 3.7 g/dL (ref 3.5–5.0)
Alkaline Phosphatase: 89 U/L (ref 38–126)
Anion gap: 9 (ref 5–15)
BUN: 8 mg/dL (ref 8–23)
CO2: 26 mmol/L (ref 22–32)
Calcium: 9.1 mg/dL (ref 8.9–10.3)
Chloride: 102 mmol/L (ref 98–111)
Creatinine, Ser: 0.64 mg/dL (ref 0.61–1.24)
GFR, Estimated: >60 mL/min (ref >60)
Glucose, Bld: 105 mg/dL — ABNORMAL HIGH (ref 70–99)
Potassium: 3.9 mmol/L (ref 3.5–5.1)
Sodium: 137 mmol/L (ref 135–145)
Total Bilirubin: 0.6 mg/dL (ref 0.3–1.2)
Total Protein: 9 g/dL — ABNORMAL HIGH (ref 6.5–8.1)

## 2020-06-26 LAB — APTT: aPTT: 31 seconds (ref 24–36)

## 2020-06-26 LAB — TYPE AND SCREEN
ABO/RH(D): A POS
Antibody Screen: NEGATIVE

## 2020-06-26 LAB — PROTIME-INR
INR: 1 (ref 0.8–1.2)
Prothrombin Time: 13.2 seconds (ref 11.4–15.2)

## 2020-06-26 LAB — SEDIMENTATION RATE: Sed Rate: 84 mm/hr — ABNORMAL HIGH (ref 0–20)

## 2020-06-26 LAB — C-REACTIVE PROTEIN: CRP: 1.6 mg/dL — ABNORMAL HIGH (ref <1.0)

## 2020-06-26 LAB — SURGICAL PCR SCREEN
MRSA, PCR: NEGATIVE
Staphylococcus aureus: NEGATIVE

## 2020-06-26 NOTE — Patient Instructions (Addendum)
Your procedure is scheduled on: Monday July 03, 2020. Su procedimiento est programado para: Lunes 20 de Diciembre del 2021. Report to Day Surgery inside [facility] 2nd floor (stop by registration on first floor)  Presntese a: [facility] Amb[facility] dentro del [facility] 2do piso (registrese en el primer piso primero) To find out your arrival time please call [phone] between 1PM - 3PM on Friday June 30, 2020. Para saber su hora de llegada por favor llame al [phone] entre la 1PM - 3PM el da: viernes  17 de Diciembre del 2021.   Remember: Instructions that are not followed completely may result in serious medical risk, up to and including death,  or upon the discretion of your surgeon and anesthesiologist your surgery may need to be rescheduled.  Recuerde: Las instrucciones que no se siguen completamente Armed forces logistics/support/administrative officer en un riesgo de salud grave, incluyendo hasta  la [place] o a discrecin de su cirujano y Scientific laboratory technician, su ciruga se puede posponer.   __X_ 1.Do not eat food after midnight the night before your procedure. No    gum chewing or hard candies. You may drink clear liquids up to 2 hours     before you are scheduled to arrive for your surgery- DO not drink clear     Liquids within 2 hours of the start of your surgery.     Clear Liquids include:    water, apple juice without pulp, clear carbohydrate drink such as    Clearfast of [place]art[place]ade, Black Coffee or Tea (Do not add anything to coffee or tea).      No coma nada despus de la medianoche de la noche anterior a su    procedimiento. No coma chicles ni caramelos duros. Puede tomar    lquidos claros hasta 2 horas antes de su hora programada de llegada al     hospital para su procedimiento. No tome lquidos claros durante el     transcurso de las 2 horas de su llegada programada al hospital para su     procedimiento, ya que esto puede llevar a que su procedimiento se    retrase o tenga que volver a  Magazine features editor.  Los lquidos claros incluyen:          - Agua o jugo de [place] sin pulpa          - Bebidas claras con carbohidratos como ClearFast o Gatorade          - Caf negro o t claro (sin leche, sin cremas, no agregue nada al caf ni al t)  No tome nada que no est en esta lista.  Los pacientes con diabetes tipo 1 y tipo 2 solo deben Printmaker.  Llame a la clnica de PreCare o a la unidad de Same Day Surgery si  tiene alguna pregunta sobre estas instrucciones.              _X__ 2.Do Not Smoke or use e-cigarettes For 24 Hours Prior to Your Surgery.    Do not use any chewable tobacco products for at least 6   hours prior to surgery.    No fume ni use cigarrillos electrnicos durante las 24 horas previas    a su [place].  No use ningn producto de tabaco masticable durante   al menos 6 horas antes de la [place].     __X_ 3. No alcohol for 24 hours before or after surgery.    No tome alcohol durante las 24 horas antes ni despus  de la [place].   __X__4. On the morning of surgery brush your teeth with toothpaste and water, you                may rinse your mouth with mouthwash if you wish.  Do not swallow any toothpaste of mouthwash.   En la maana de la [place], cepllese los dientes con pasta de dientes y [place],                [place] enjuagarse la boca con enjuague bucal si lo desea. No ingiera ninguna pasta de dientes o enjuague bucal.   __X__ 5. Notify your doctor if there is any change in your medical condition (cold,fever, infections).    Informe a su mdico si hay algn cambio en su condicin mdica  (resfriado, fiebre, infecciones).   Do not wear jewelry, make-up, hairpins, clips or nail polish.  No use joyas, maquillajes, pinzas/ganchos para el cabello ni esmalte de uas.  Do not wear lotions, powders, or perfumes. You may wear deodorant.  No use lociones, polvos o perfumes.  Puede usar desodorante.    Do not shave 48 hours prior to surgery. Men may shave face and  neck.  No se afeite 48 horas antes de la [place].  Los hombres pueden [employer] cara  y el cuello.   Do not bring valuables to the hospital.   No lleve objetos de valor al hospital.  [facility] is not responsible for any belongings or valuables.  [facility] no se hace responsable de ningn tipo de pertenencias u objetos de Licensed conveyancer.               Contacts, dentures or bridgework may not be worn into surgery.  Los lentes de [place], las dentaduras postizas o puentes no se pueden usar en la [place].   Leave your suitcase in the car. After surgery it may be brought to your room.  Deje su maleta en el auto.  Despus de la ciruga podr traerla a su habitacin.   For patients admitted to the hospital, discharge time is determined by your  treatment team.  Para los pacientes que sean ingresados al hospital, el tiempo en el cual se le  dar de alta es determinado por su equipo de [place].   Patients discharged the day of surgery will not be allowed to drive home. A los pacientes que se les da de alta el mismo da de la ciruga no se les permitir conducir a Higher education careers adviser.   Please read over the following fact sheets that you were given: Por favor lea las siguientes hojas de informacin que le dieron:     __X__ Take these medicines the morning of surgery with A SIP OF WATER:          [employer] medicinas la maana de la ciruga con UN SORBO DE AGUA:  1. None    ____ Fleet Enema (as directed)          Enema de Fleet (segn lo indicado)    __X__ Use CHG Soap as directed          Utilice el jabn de CHG segn lo indicado  ____ Use inhalers on the day of surgery          Use los inhaladores el da de la ciruga  ____ Stop metformin 2 days prior to surgery          Deje de tomar el metformin 2 das antes de la [place]  ____ Take 1/2 of usual insulin dose the night before surgery and none on the morning of surgery           Tome la mitad de la dosis habitual de insulina la noche antes de  la [place] y no tome nada en la maana de la             ciruga  __X__ Stop Anti-inflammatories such as Ibuprofen, Aleve, Advil, naproxen, aspirin and or BC powders.          Deje de tomar antiinflamatorios como Ibuprofen, Aleve, Advil, naproxen, aspirinas o polvos de BC powders.    __X__ Stop supplements until after surgery            Deje de tomar suplementos hasta despus de la ciruga  __X__ Do not start any herbal supplements before your procedure.           No empize a tomar suplementos de hierbas antes de su cirugia.

## 2020-06-27 LAB — URINE CULTURE
Culture: NO GROWTH
Special Requests: NORMAL

## 2020-06-29 ENCOUNTER — Other Ambulatory Visit: Payer: Self-pay

## 2020-06-29 ENCOUNTER — Other Ambulatory Visit
Admission: RE | Admit: 2020-06-29 | Discharge: 2020-06-29 | Disposition: A | Discharge status: Home / Self Care | Payer: BC Managed Care – PPO | Source: Ambulatory Visit | Attending: Orthopedic Surgery | Admitting: Orthopedic Surgery

## 2020-06-29 DIAGNOSIS — Z20822 Contact with and (suspected) exposure to covid-19: Secondary | ICD-10-CM | POA: Diagnosis not present

## 2020-06-29 DIAGNOSIS — Z01812 Encounter for preprocedural laboratory examination: Secondary | ICD-10-CM | POA: Insufficient documentation

## 2020-06-29 LAB — SARS CORONAVIRUS 2 (TAT 6-24 HRS): SARS Coronavirus 2: NEGATIVE

## 2020-07-02 MED ORDER — CHLORHEXIDINE GLUCONATE 4 % EX LIQD: 60.0000 mL | Freq: Once | CUTANEOUS | Status: DC

## 2020-07-02 MED ORDER — DEXAMETHASONE SODIUM PHOSPHATE 10 MG/ML IJ SOLN: 8.0000 mg | Freq: Once | INTRAMUSCULAR | Status: DC

## 2020-07-02 MED ORDER — CELECOXIB 200 MG PO CAPS: 400.0000 mg | ORAL_CAPSULE | Freq: Once | ORAL | Status: DC

## 2020-07-02 MED ORDER — TRANEXAMIC ACID-NACL 1000-0.7 MG/100ML-% IV SOLN: 1000.0000 mg | INTRAVENOUS | Status: DC

## 2020-07-02 MED ORDER — CEFAZOLIN SODIUM-DEXTROSE 2-4 GM/100ML-% IV SOLN: 2.0000 g | INTRAVENOUS | Status: DC

## 2020-07-02 MED ORDER — LACTATED RINGERS IV SOLN: INTRAVENOUS | Status: DC

## 2020-07-02 MED ORDER — FAMOTIDINE 20 MG PO TABS: 20.0000 mg | ORAL_TABLET | Freq: Once | ORAL | Status: DC

## 2020-07-02 MED ORDER — CHLORHEXIDINE GLUCONATE 0.12 % MT SOLN: 15.0000 mL | Freq: Once | OROMUCOSAL | Status: DC

## 2020-07-02 MED ORDER — ORAL CARE MOUTH RINSE: 15.0000 mL | Freq: Once | OROMUCOSAL | Status: DC

## 2020-07-02 MED ORDER — GABAPENTIN 300 MG PO CAPS: 300.0000 mg | ORAL_CAPSULE | Freq: Once | ORAL | Status: DC

## 2020-07-03 ENCOUNTER — Inpatient Hospital Stay
Admission: RE | Admit: 2020-07-03 | Discharge: 2020-07-05 | DRG: 470 | Disposition: A | Discharge status: Home Health | Payer: BC Managed Care – PPO | Attending: Orthopedic Surgery | Admitting: Orthopedic Surgery

## 2020-07-03 ENCOUNTER — Inpatient Hospital Stay: Payer: BC Managed Care – PPO | Admitting: Anesthesiology

## 2020-07-03 ENCOUNTER — Other Ambulatory Visit: Payer: Self-pay

## 2020-07-03 ENCOUNTER — Inpatient Hospital Stay: Payer: BC Managed Care – PPO

## 2020-07-03 ENCOUNTER — Encounter
Admission: RE | Disposition: A | Discharge status: Home Health | Payer: Self-pay | Source: Home / Self Care | Attending: Orthopedic Surgery

## 2020-07-03 ENCOUNTER — Encounter: Payer: Self-pay | Admitting: Orthopedic Surgery

## 2020-07-03 DIAGNOSIS — Z96652 Presence of left artificial knee joint: Secondary | ICD-10-CM | POA: Diagnosis present

## 2020-07-03 DIAGNOSIS — Z87891 Personal history of nicotine dependence: Secondary | ICD-10-CM

## 2020-07-03 DIAGNOSIS — M7989 Other specified soft tissue disorders: Secondary | ICD-10-CM

## 2020-07-03 DIAGNOSIS — M1711 Unilateral primary osteoarthritis, right knee: Principal | ICD-10-CM | POA: Diagnosis present

## 2020-07-03 DIAGNOSIS — Z96659 Presence of unspecified artificial knee joint: Secondary | ICD-10-CM

## 2020-07-03 DIAGNOSIS — Z20822 Contact with and (suspected) exposure to covid-19: Secondary | ICD-10-CM | POA: Diagnosis present

## 2020-07-03 DIAGNOSIS — I739 Peripheral vascular disease, unspecified: Secondary | ICD-10-CM | POA: Diagnosis present

## 2020-07-03 HISTORY — PX: KNEE ARTHROPLASTY: SHX992

## 2020-07-03 SURGERY — ARTHROPLASTY, KNEE, TOTAL, USING IMAGELESS COMPUTER-ASSISTED NAVIGATION
Anesthesia: Spinal | Site: Knee | Laterality: Right

## 2020-07-03 MED ORDER — LACTATED RINGERS IV SOLN: INTRAVENOUS | Status: DC | PRN

## 2020-07-03 MED ORDER — GABAPENTIN 300 MG PO CAPS
300.0000 mg | ORAL_CAPSULE | Freq: Every day | ORAL | Status: DC
  Administered 2020-07-03 – 2020-07-04 (×2): 300 mg via ORAL
  Filled 2020-07-03 (×2): qty 1

## 2020-07-03 MED ORDER — TRANEXAMIC ACID-NACL 1000-0.7 MG/100ML-% IV SOLN
INTRAVENOUS | Status: AC
  Filled 2020-07-03: qty 100

## 2020-07-03 MED ORDER — BUPIVACAINE LIPOSOME 1.3 % IJ SUSP
INTRAMUSCULAR | Status: AC
  Filled 2020-07-03: qty 20

## 2020-07-03 MED ORDER — EPHEDRINE SULFATE 50 MG/ML IJ SOLN
INTRAMUSCULAR | Status: DC | PRN
  Administered 2020-07-03 (×2): 10 mg via INTRAVENOUS

## 2020-07-03 MED ORDER — CELECOXIB 200 MG PO CAPS: 400.0000 mg | ORAL_CAPSULE | Freq: Once | ORAL | Status: AC

## 2020-07-03 MED ORDER — ALUM & MAG HYDROXIDE-SIMETH 200-200-20 MG/5ML PO SUSP: 30.0000 mL | ORAL | Status: DC | PRN

## 2020-07-03 MED ORDER — OXYCODONE HCL 5 MG PO TABS
5.0000 mg | ORAL_TABLET | ORAL | Status: DC | PRN
  Administered 2020-07-05: 5 mg via ORAL
  Filled 2020-07-03: qty 1

## 2020-07-03 MED ORDER — CEFAZOLIN SODIUM-DEXTROSE 2-4 GM/100ML-% IV SOLN
2.0000 g | Freq: Four times a day (QID) | INTRAVENOUS | Status: AC
  Administered 2020-07-03 – 2020-07-04 (×2): 2 g via INTRAVENOUS
  Filled 2020-07-03 (×2): qty 100

## 2020-07-03 MED ORDER — MAGNESIUM HYDROXIDE 400 MG/5ML PO SUSP
30.0000 mL | Freq: Every day | ORAL | Status: DC
  Administered 2020-07-04 – 2020-07-05 (×2): 30 mL via ORAL
  Filled 2020-07-03 (×2): qty 30

## 2020-07-03 MED ORDER — ENOXAPARIN SODIUM 30 MG/0.3ML ~~LOC~~ SOLN
30.0000 mg | Freq: Two times a day (BID) | SUBCUTANEOUS | Status: DC
  Administered 2020-07-04 – 2020-07-05 (×3): 30 mg via SUBCUTANEOUS
  Filled 2020-07-03 (×3): qty 0.3

## 2020-07-03 MED ORDER — METOCLOPRAMIDE HCL 10 MG PO TABS
10.0000 mg | ORAL_TABLET | Freq: Three times a day (TID) | ORAL | Status: DC
  Administered 2020-07-03 – 2020-07-05 (×7): 10 mg via ORAL
  Filled 2020-07-03 (×7): qty 1

## 2020-07-03 MED ORDER — MIDAZOLAM HCL 2 MG/2ML IJ SOLN
INTRAMUSCULAR | Status: AC
  Filled 2020-07-03: qty 2

## 2020-07-03 MED ORDER — BUPIVACAINE HCL (PF) 0.25 % IJ SOLN
INTRAMUSCULAR | Status: AC
  Filled 2020-07-03: qty 60

## 2020-07-03 MED ORDER — SENNOSIDES-DOCUSATE SODIUM 8.6-50 MG PO TABS
1.0000 | ORAL_TABLET | Freq: Two times a day (BID) | ORAL | Status: DC
  Administered 2020-07-03 – 2020-07-05 (×4): 1 via ORAL
  Filled 2020-07-03 (×4): qty 1

## 2020-07-03 MED ORDER — GLYCOPYRROLATE 0.2 MG/ML IJ SOLN
INTRAMUSCULAR | Status: DC | PRN
  Administered 2020-07-03 (×2): .2 mg via INTRAVENOUS

## 2020-07-03 MED ORDER — SODIUM CHLORIDE 0.9 % IR SOLN
Status: DC | PRN
  Administered 2020-07-03: 15:00:00 500 mL

## 2020-07-03 MED ORDER — CHLORHEXIDINE GLUCONATE 0.12 % MT SOLN
OROMUCOSAL | Status: AC
  Filled 2020-07-03: qty 15

## 2020-07-03 MED ORDER — SODIUM CHLORIDE 0.9 % IV SOLN: INTRAVENOUS | Status: DC

## 2020-07-03 MED ORDER — PROPOFOL 10 MG/ML IV BOLUS
INTRAVENOUS | Status: DC | PRN
  Administered 2020-07-03: 50 mg via INTRAVENOUS

## 2020-07-03 MED ORDER — NEOMYCIN-POLYMYXIN B GU 40-200000 IR SOLN
Status: AC
  Filled 2020-07-03: qty 2

## 2020-07-03 MED ORDER — ONDANSETRON HCL 4 MG/2ML IJ SOLN: 4.0000 mg | Freq: Once | INTRAMUSCULAR | Status: DC | PRN

## 2020-07-03 MED ORDER — DEXAMETHASONE SODIUM PHOSPHATE 10 MG/ML IJ SOLN: 8.0000 mg | Freq: Once | INTRAMUSCULAR | Status: AC

## 2020-07-03 MED ORDER — TRANEXAMIC ACID-NACL 1000-0.7 MG/100ML-% IV SOLN
1000.0000 mg | INTRAVENOUS | Status: AC
  Administered 2020-07-03: 12:00:00 1000 mg via INTRAVENOUS

## 2020-07-03 MED ORDER — ENSURE PRE-SURGERY PO LIQD
296.0000 mL | Freq: Once | ORAL | Status: DC
  Filled 2020-07-03: qty 296

## 2020-07-03 MED ORDER — FERROUS SULFATE 325 (65 FE) MG PO TABS
325.0000 mg | ORAL_TABLET | Freq: Two times a day (BID) | ORAL | Status: DC
  Administered 2020-07-03 – 2020-07-05 (×4): 325 mg via ORAL
  Filled 2020-07-03 (×4): qty 1

## 2020-07-03 MED ORDER — FENTANYL CITRATE (PF) 100 MCG/2ML IJ SOLN
INTRAMUSCULAR | Status: AC
  Filled 2020-07-03: qty 2

## 2020-07-03 MED ORDER — PANTOPRAZOLE SODIUM 40 MG PO TBEC
40.0000 mg | DELAYED_RELEASE_TABLET | Freq: Two times a day (BID) | ORAL | Status: DC
  Administered 2020-07-03 – 2020-07-05 (×4): 40 mg via ORAL
  Filled 2020-07-03 (×4): qty 1

## 2020-07-03 MED ORDER — GABAPENTIN 300 MG PO CAPS
ORAL_CAPSULE | ORAL | Status: AC
  Administered 2020-07-03: 10:00:00 300 mg via ORAL
  Filled 2020-07-03: qty 1

## 2020-07-03 MED ORDER — SURGIPHOR WOUND IRRIGATION SYSTEM - OPTIME
TOPICAL | Status: DC | PRN
  Administered 2020-07-03: 1 via TOPICAL

## 2020-07-03 MED ORDER — FAMOTIDINE 20 MG PO TABS
ORAL_TABLET | ORAL | Status: AC
  Administered 2020-07-03: 10:00:00 20 mg via ORAL
  Filled 2020-07-03: qty 1

## 2020-07-03 MED ORDER — DEXAMETHASONE SODIUM PHOSPHATE 10 MG/ML IJ SOLN
INTRAMUSCULAR | Status: AC
  Administered 2020-07-03: 11:00:00 8 mg via INTRAVENOUS
  Filled 2020-07-03: qty 1

## 2020-07-03 MED ORDER — FENTANYL CITRATE (PF) 100 MCG/2ML IJ SOLN: 25.0000 ug | INTRAMUSCULAR | Status: DC | PRN

## 2020-07-03 MED ORDER — TRAMADOL HCL 50 MG PO TABS
50.0000 mg | ORAL_TABLET | ORAL | Status: DC | PRN
  Administered 2020-07-03 – 2020-07-04 (×3): 50 mg via ORAL
  Filled 2020-07-03 (×3): qty 1

## 2020-07-03 MED ORDER — SODIUM CHLORIDE 0.9 % IV SOLN
INTRAVENOUS | Status: DC | PRN
  Administered 2020-07-03: 15:00:00 60 mL

## 2020-07-03 MED ORDER — BISACODYL 10 MG RE SUPP
10.0000 mg | Freq: Every day | RECTAL | Status: DC | PRN
  Administered 2020-07-05: 12:00:00 10 mg via RECTAL
  Filled 2020-07-03: qty 1

## 2020-07-03 MED ORDER — EPHEDRINE 5 MG/ML INJ
INTRAVENOUS | Status: AC
  Filled 2020-07-03: qty 10

## 2020-07-03 MED ORDER — FAMOTIDINE 20 MG PO TABS: 20.0000 mg | ORAL_TABLET | Freq: Once | ORAL | Status: AC

## 2020-07-03 MED ORDER — PROPOFOL 500 MG/50ML IV EMUL
INTRAVENOUS | Status: DC | PRN
  Administered 2020-07-03: 75 ug/kg/min via INTRAVENOUS

## 2020-07-03 MED ORDER — SODIUM CHLORIDE FLUSH 0.9 % IV SOLN
INTRAVENOUS | Status: AC
  Filled 2020-07-03: qty 40

## 2020-07-03 MED ORDER — CEFAZOLIN SODIUM-DEXTROSE 2-4 GM/100ML-% IV SOLN
2.0000 g | Freq: Once | INTRAVENOUS | Status: AC
  Administered 2020-07-03: 12:00:00 2 g via INTRAVENOUS

## 2020-07-03 MED ORDER — PHENOL 1.4 % MT LIQD
1.0000 | OROMUCOSAL | Status: DC | PRN
  Filled 2020-07-03: qty 177

## 2020-07-03 MED ORDER — PROPOFOL 500 MG/50ML IV EMUL
INTRAVENOUS | Status: AC
  Filled 2020-07-03: qty 50

## 2020-07-03 MED ORDER — GLYCOPYRROLATE 0.2 MG/ML IJ SOLN
INTRAMUSCULAR | Status: AC
  Filled 2020-07-03: qty 1

## 2020-07-03 MED ORDER — CELECOXIB 200 MG PO CAPS
ORAL_CAPSULE | ORAL | Status: AC
  Administered 2020-07-03: 10:00:00 400 mg via ORAL
  Filled 2020-07-03: qty 2

## 2020-07-03 MED ORDER — ACETAMINOPHEN 10 MG/ML IV SOLN
1000.0000 mg | Freq: Four times a day (QID) | INTRAVENOUS | Status: AC
  Administered 2020-07-03 – 2020-07-04 (×4): 1000 mg via INTRAVENOUS
  Filled 2020-07-03 (×4): qty 100

## 2020-07-03 MED ORDER — ONDANSETRON HCL 4 MG PO TABS: 4.0000 mg | ORAL_TABLET | Freq: Four times a day (QID) | ORAL | Status: DC | PRN

## 2020-07-03 MED ORDER — ACETAMINOPHEN 10 MG/ML IV SOLN
INTRAVENOUS | Status: DC | PRN
  Administered 2020-07-03: 1000 mg via INTRAVENOUS

## 2020-07-03 MED ORDER — CELECOXIB 200 MG PO CAPS
200.0000 mg | ORAL_CAPSULE | Freq: Two times a day (BID) | ORAL | Status: DC
  Administered 2020-07-03 – 2020-07-05 (×4): 200 mg via ORAL
  Filled 2020-07-03 (×4): qty 1

## 2020-07-03 MED ORDER — ACETAMINOPHEN 10 MG/ML IV SOLN
INTRAVENOUS | Status: AC
  Filled 2020-07-03: qty 100

## 2020-07-03 MED ORDER — ACETAMINOPHEN 325 MG PO TABS: 325.0000 mg | ORAL_TABLET | Freq: Four times a day (QID) | ORAL | Status: DC | PRN

## 2020-07-03 MED ORDER — CEFAZOLIN SODIUM-DEXTROSE 2-4 GM/100ML-% IV SOLN
INTRAVENOUS | Status: AC
  Filled 2020-07-03: qty 100

## 2020-07-03 MED ORDER — BUPIVACAINE HCL (PF) 0.25 % IJ SOLN
INTRAMUSCULAR | Status: DC | PRN
  Administered 2020-07-03: 60 mL

## 2020-07-03 MED ORDER — GABAPENTIN 300 MG PO CAPS
300.0000 mg | ORAL_CAPSULE | Freq: Once | ORAL | Status: AC
  Administered 2020-07-03: 18:00:00 300 mg via ORAL
  Filled 2020-07-03: qty 1

## 2020-07-03 MED ORDER — HYDROMORPHONE HCL 1 MG/ML IJ SOLN: 0.5000 mg | INTRAMUSCULAR | Status: DC | PRN

## 2020-07-03 MED ORDER — TRANEXAMIC ACID-NACL 1000-0.7 MG/100ML-% IV SOLN: 1000.0000 mg | Freq: Once | INTRAVENOUS | Status: AC

## 2020-07-03 MED ORDER — FLEET ENEMA 7-19 GM/118ML RE ENEM: 1.0000 | ENEMA | Freq: Once | RECTAL | Status: DC | PRN

## 2020-07-03 MED ORDER — MIDAZOLAM HCL 5 MG/5ML IJ SOLN
INTRAMUSCULAR | Status: DC | PRN
  Administered 2020-07-03: 2 mg via INTRAVENOUS

## 2020-07-03 MED ORDER — TRANEXAMIC ACID-NACL 1000-0.7 MG/100ML-% IV SOLN
INTRAVENOUS | Status: AC
  Administered 2020-07-03: 16:00:00 1000 mg via INTRAVENOUS
  Filled 2020-07-03: qty 100

## 2020-07-03 MED ORDER — DIPHENHYDRAMINE HCL 12.5 MG/5ML PO ELIX: 12.5000 mg | ORAL_SOLUTION | ORAL | Status: DC | PRN

## 2020-07-03 MED ORDER — MENTHOL 3 MG MT LOZG
1.0000 | LOZENGE | OROMUCOSAL | Status: DC | PRN
Start: 2020-07-03 — End: 2020-07-05
  Filled 2020-07-03: qty 9

## 2020-07-03 MED ORDER — SODIUM CHLORIDE 0.9 % IV SOLN
INTRAVENOUS | Status: DC | PRN
  Administered 2020-07-03: 12:00:00 50 ug/min via INTRAVENOUS

## 2020-07-03 MED ORDER — BUPIVACAINE HCL (PF) 0.5 % IJ SOLN
INTRAMUSCULAR | Status: DC | PRN
  Administered 2020-07-03: 3 mL

## 2020-07-03 MED ORDER — ONDANSETRON HCL 4 MG/2ML IJ SOLN: 4.0000 mg | Freq: Four times a day (QID) | INTRAMUSCULAR | Status: DC | PRN

## 2020-07-03 SURGICAL SUPPLY — 78 items
ATTUNE MED DOME PAT 41 KNEE (Knees) ×1 IMPLANT
ATTUNE MED DOME PAT 41MM KNEE (Knees) ×1 IMPLANT
ATTUNE PS FEM RT SZ 7 CEM KNEE (Femur) ×2 IMPLANT
ATTUNE PSRP INSR SZ7 12 KNEE (Insert) ×1 IMPLANT
ATTUNE PSRP INSR SZ7 12MM KNEE (Insert) ×1 IMPLANT
BATTERY INSTRU NAVIGATION (MISCELLANEOUS) ×12 IMPLANT
BLADE SAW 70X12.5 (BLADE) ×3 IMPLANT
BLADE SAW 90X13X1.19 OSCILLAT (BLADE) ×3 IMPLANT
BLADE SAW 90X25X1.19 OSCILLAT (BLADE) ×3 IMPLANT
CANISTER PREVENA PLUS 150 (CANNISTER) ×3 IMPLANT
CANISTER SUCT 3000ML PPV (MISCELLANEOUS) ×3 IMPLANT
CEMENT HV SMART SET (Cement) ×4 IMPLANT
COOLER POLAR GLACIER W/PUMP (MISCELLANEOUS) ×3 IMPLANT
COVER WAND RF STERILE (DRAPES) ×3 IMPLANT
CUFF TOURN SGL QUICK 24 (TOURNIQUET CUFF) ×2
CUFF TOURN SGL QUICK 30 (TOURNIQUET CUFF)
CUFF TRNQT CYL 24X4X16.5-23 (TOURNIQUET CUFF) IMPLANT
CUFF TRNQT CYL 30X4X21-28X (TOURNIQUET CUFF) IMPLANT
DRAPE 3/4 80X56 (DRAPES) ×3 IMPLANT
DRSG DERMACEA 8X12 NADH (GAUZE/BANDAGES/DRESSINGS) ×3 IMPLANT
DRSG MEPILEX SACRM 8.7X9.8 (GAUZE/BANDAGES/DRESSINGS) ×3 IMPLANT
DRSG OPSITE POSTOP 4X14 (GAUZE/BANDAGES/DRESSINGS) ×3 IMPLANT
DRSG TEGADERM 4X4.75 (GAUZE/BANDAGES/DRESSINGS) ×3 IMPLANT
DURAPREP 26ML APPLICATOR (WOUND CARE) ×6 IMPLANT
ELECT REM PT RETURN 9FT ADLT (ELECTROSURGICAL) ×3
ELECTRODE REM PT RTRN 9FT ADLT (ELECTROSURGICAL) ×1 IMPLANT
EX-PIN ORTHOLOCK NAV 4X150 (PIN) ×6 IMPLANT
GLOVE BIO SURGEON STRL SZ7.5 (GLOVE) ×6 IMPLANT
GLOVE BIOGEL M STRL SZ7.5 (GLOVE) ×6 IMPLANT
GLOVE BIOGEL PI IND STRL 7.5 (GLOVE) ×1 IMPLANT
GLOVE BIOGEL PI INDICATOR 7.5 (GLOVE) ×2
GLOVE INDICATOR 8.0 STRL GRN (GLOVE) ×3 IMPLANT
GOWN STRL REUS W/ TWL LRG LVL3 (GOWN DISPOSABLE) ×2 IMPLANT
GOWN STRL REUS W/ TWL XL LVL3 (GOWN DISPOSABLE) ×1 IMPLANT
GOWN STRL REUS W/TWL LRG LVL3 (GOWN DISPOSABLE) ×4
GOWN STRL REUS W/TWL XL LVL3 (GOWN DISPOSABLE) ×2
HEMOVAC 400CC 10FR (MISCELLANEOUS) ×3 IMPLANT
HOLDER FOLEY CATH W/STRAP (MISCELLANEOUS) ×3 IMPLANT
HOOD PEEL AWAY FLYTE STAYCOOL (MISCELLANEOUS) ×6 IMPLANT
IRRIGATION SURGIPHOR STRL (IV SOLUTION) ×3 IMPLANT
KIT PREVENA INCISION MGT20CM45 (CANNISTER) ×3 IMPLANT
KIT PUMP PREVENA PLUS 14DAY (MISCELLANEOUS) ×3 IMPLANT
KIT TURNOVER KIT A (KITS) ×3 IMPLANT
KNIFE SCULPS 14X20 (INSTRUMENTS) ×3 IMPLANT
LABEL OR SOLS (LABEL) ×3 IMPLANT
MANIFOLD NEPTUNE II (INSTRUMENTS) ×6 IMPLANT
NDL SAFETY ECLIPSE 18X1.5 (NEEDLE) ×1 IMPLANT
NDL SPNL 20GX3.5 QUINCKE YW (NEEDLE) ×2 IMPLANT
NEEDLE HYPO 18GX1.5 SHARP (NEEDLE) ×2
NEEDLE SPNL 20GX3.5 QUINCKE YW (NEEDLE) ×6 IMPLANT
NS IRRIG 500ML POUR BTL (IV SOLUTION) ×3 IMPLANT
PACK TOTAL KNEE (MISCELLANEOUS) ×3 IMPLANT
PAD WRAPON POLAR KNEE (MISCELLANEOUS) ×1 IMPLANT
PENCIL SMOKE ULTRAEVAC 22 CON (MISCELLANEOUS) ×3 IMPLANT
PIN DRILL QUICK PACK ×3 IMPLANT
PIN FIXATION 1/8DIA X 3INL (PIN) ×9 IMPLANT
PULSAVAC PLUS IRRIG FAN TIP (DISPOSABLE) ×3
SOL .9 NS 3000ML IRR  AL (IV SOLUTION) ×2
SOL .9 NS 3000ML IRR UROMATIC (IV SOLUTION) ×1 IMPLANT
SOL PREP PVP 2OZ (MISCELLANEOUS) ×3
SOLUTION PREP PVP 2OZ (MISCELLANEOUS) ×1 IMPLANT
SPONGE DRAIN TRACH 4X4 STRL 2S (GAUZE/BANDAGES/DRESSINGS) ×3 IMPLANT
STAPLER SKIN PROX 35W (STAPLE) ×3 IMPLANT
STOCKINETTE IMPERV 14X48 (MISCELLANEOUS) IMPLANT
STRAP TIBIA SHORT (MISCELLANEOUS) ×3 IMPLANT
SUCTION FRAZIER HANDLE 10FR (MISCELLANEOUS) ×2
SUCTION TUBE FRAZIER 10FR DISP (MISCELLANEOUS) ×1 IMPLANT
SUT VIC AB 0 CT1 36 (SUTURE) ×6 IMPLANT
SUT VIC AB 1 CT1 36 (SUTURE) ×6 IMPLANT
SUT VIC AB 2-0 CT2 27 (SUTURE) ×3 IMPLANT
SYR 20ML LL LF (SYRINGE) ×3 IMPLANT
SYR 30ML LL (SYRINGE) ×6 IMPLANT
SYS TIBIA BASE ATTUNE KNEE SZ7 (Orthopedic Implant) ×2 IMPLANT
TIP FAN IRRIG PULSAVAC PLUS (DISPOSABLE) ×1 IMPLANT
TOWEL OR 17X26 4PK STRL BLUE (TOWEL DISPOSABLE) ×3 IMPLANT
TOWER CARTRIDGE SMART MIX (DISPOSABLE) ×3 IMPLANT
TRAY FOLEY MTR SLVR 16FR STAT (SET/KITS/TRAYS/PACK) ×3 IMPLANT
WRAPON POLAR PAD KNEE (MISCELLANEOUS) ×3

## 2020-07-03 NOTE — Anesthesia Procedure Notes (Signed)
Spinal  Patient location during procedure: OR Start time: 07/03/2020 11:32 AM End time: 07/03/2020 11:37 AM Staffing Performed: resident/CRNA  Resident/CRNA: Nelda Marseille, CRNA Preanesthetic Checklist Completed: patient identified, IV checked, site marked, risks and benefits discussed, surgical consent, monitors and equipment checked, pre-op evaluation and timeout performed Spinal Block Patient position: sitting Prep: Betadine Patient monitoring: heart rate, continuous pulse ox, blood pressure and cardiac monitor Approach: midline Location: L3-4 Injection technique: single-shot Needle Needle type: [place]acre and Introducer  Needle gauge: 25 G Needle length: 9 cm Additional Notes Negative paresthesia. Negative blood return. Positive free-flowing CSF. Expiration date of kit checked and confirmed. Patient tolerated procedure well, without complications.

## 2020-07-03 NOTE — Anesthesia Procedure Notes (Signed)
Date/Time: 07/03/2020 1:00 PM Performed by: Junious Silk, CRNA Pre-anesthesia Checklist: Patient identified, Emergency Drugs available, Suction available, Patient being monitored and Timeout performed Oxygen Delivery Method: Simple face mask

## 2020-07-03 NOTE — H&P (Signed)
The patient has been re-examined, and the chart reviewed, and there have been no interval changes to the documented history and physical.    The risks, benefits, and alternatives have been discussed at length. The patient expressed understanding of the risks benefits and agreed with plans for surgical intervention.  [name], Jr. M.D.    

## 2020-07-03 NOTE — Plan of Care (Signed)
  Problem: Activity: Goal: Risk for activity intolerance will decrease Outcome: Progressing   Problem: Pain Managment: Goal: General experience of comfort will improve Outcome: Progressing   

## 2020-07-03 NOTE — Op Note (Signed)
OPERATIVE NOTE  DATE OF SURGERY:  07/03/2020  PATIENT NAME:  [name]   DOB: August 22, 1955  MRN: [id number]  PRE-OPERATIVE DIAGNOSIS: Degenerative arthrosis of the right knee, primary  POST-OPERATIVE DIAGNOSIS:  Same  PROCEDURE:  Right total knee arthroplasty using computer-assisted navigation  SURGEON:  Jena Gauss. M.D.  ASSISTANT: Baldwin Jamaica, PA-C (present and scrubbed throughout the case, critical for assistance with exposure, retraction, instrumentation, and closure)  ANESTHESIA: spinal  ESTIMATED BLOOD LOSS: 50 mL  FLUIDS REPLACED: 2400 mL of crystalloid  TOURNIQUET TIME: 107 minutes  DRAINS: 2 medium Hemovac  SOFT TISSUE RELEASES: Anterior cruciate ligament, posterior cruciate ligament, deep medial collateral ligament, patellofemoral ligament  IMPLANTS UTILIZED: DePuy Attune size 7 posterior stabilized femoral component (cemented), size 7 rotating platform tibial component (cemented), 41 mm medialized dome patella (cemented), and a 12 mm stabilized rotating platform polyethylene insert.  INDICATIONS FOR SURGERY: [name] is a 64 y.o. year old male with a long history of progressive knee pain. X-rays demonstrated severe degenerative changes in tricompartmental fashion. The patient had not seen any significant improvement despite conservative nonsurgical intervention. After discussion of the risks and benefits of surgical intervention, the patient expressed understanding of the risks benefits and agree with plans for total knee arthroplasty.   The risks, benefits, and alternatives were discussed at length including but not limited to the risks of infection, bleeding, nerve injury, stiffness, blood clots, the need for revision surgery, cardiopulmonary complications, among others, and they were willing to proceed.  PROCEDURE IN DETAIL: The patient was brought into the operating room and, after adequate spinal anesthesia was achieved, a tourniquet was  placed on the patient's upper thigh. The patient's knee and leg were cleaned and prepped with alcohol and DuraPrep and draped in the usual sterile fashion. A "timeout" was performed as per usual protocol. The lower extremity was exsanguinated using an Esmarch, and the tourniquet was inflated to 300 mmHg. An anterior longitudinal incision was made followed by a standard mid vastus approach. The deep fibers of the medial collateral ligament were elevated in a subperiosteal fashion off of the medial flare of the tibia so as to maintain a continuous soft tissue sleeve. The patella was subluxed laterally and the patellofemoral ligament was incised. Inspection of the knee demonstrated severe degenerative changes with full-thickness loss of articular cartilage. Osteophytes were debrided using a rongeur. Anterior and posterior cruciate ligaments were excised. Two 4.0 mm Schanz pins were inserted in the femur and into the tibia for attachment of the array of trackers used for computer-assisted navigation. Hip center was identified using a circumduction technique. Distal landmarks were mapped using the computer. The distal femur and proximal tibia were mapped using the computer. The distal femoral cutting guide was positioned using computer-assisted navigation so as to achieve a 5 distal valgus cut. The femur was sized and it was felt that a size 7 femoral component was appropriate. A size 7 femoral cutting guide was positioned and the anterior cut was performed and verified using the computer. This was followed by completion of the posterior and chamfer cuts. Femoral cutting guide for the central box was then positioned in the center box cut was performed.  Attention was then directed to the proximal tibia. Medial and lateral menisci were excised. The extramedullary tibial cutting guide was positioned using computer-assisted navigation so as to achieve a 0 varus-valgus alignment and 3 posterior slope. The cut was  performed and verified using the computer. The proximal tibia was sized  and it was felt that a size 7 tibial tray was appropriate. Tibial and femoral trials were inserted followed by insertion of a 12 mm polyethylene insert. This allowed for excellent mediolateral soft tissue balancing both in flexion and in full extension. Finally, the patella was cut and prepared so as to accommodate a 41 mm medialized dome patella. A patella trial was placed and the knee was placed through a range of motion with excellent patellar tracking appreciated. The femoral trial was removed after debridement of posterior osteophytes. The central post-hole for the tibial component was reamed followed by insertion of a keel punch. Tibial trials were then removed. Cut surfaces of bone were irrigated with copious amounts of normal saline using pulsatile lavage and then suctioned dry. Polymethylmethacrylate cement was prepared in the usual fashion using a vacuum mixer. Cement was applied to the cut surface of the proximal tibia as well as along the undersurface of a size 7 rotating platform tibial component. Tibial component was positioned and impacted into place. Excess cement was removed using Personal assistant. Cement was then applied to the cut surfaces of the femur as well as along the posterior flanges of the size 7 femoral component. The femoral component was positioned and impacted into place. Excess cement was removed using Personal assistant. A 12 mm polyethylene trial was inserted and the knee was brought into full extension with steady axial compression applied. Finally, cement was applied to the backside of a 41 mm medialized dome patella and the patellar component was positioned and patellar clamp applied. Excess cement was removed using Personal assistant. After adequate curing of the cement, the tourniquet was deflated after a total tourniquet time of 107 minutes. Hemostasis was achieved using electrocautery. The knee was irrigated  with copious amounts of normal saline using pulsatile lavage followed by 500 ml of Surgiphor and then suctioned dry. 20 mL of 1.3% Exparel and 60 mL of 0.25% Marcaine in 40 mL of normal saline was injected along the posterior capsule, medial and lateral gutters, and along the arthrotomy site. A 12 mm stabilized rotating platform polyethylene insert was inserted and the knee was placed through a range of motion with excellent mediolateral soft tissue balancing appreciated and excellent patellar tracking noted. 2 medium drains were placed in the wound bed and brought out through separate stab incisions. The medial parapatellar portion of the incision was reapproximated using interrupted sutures of #1 Vicryl. Subcutaneous tissue was approximated in layers using first #0 Vicryl followed #2-0 Vicryl. The skin was approximated with skin staples. A sterile dressing was applied.  The patient tolerated the procedure well and was transported to the recovery room in stable condition.    Jerric Oyen P. Angie Fava., M.D.

## 2020-07-03 NOTE — Anesthesia Preprocedure Evaluation (Signed)
Anesthesia Evaluation  Patient identified by MRN, date of birth, ID band Patient awake    Reviewed: Allergy & Precautions, NPO status , Patient's Chart, lab work & pertinent test results  History of Anesthesia Complications Negative for: history of anesthetic complications  Airway Mallampati: I  TM Distance: >3 FB Neck ROM: Full    Dental  (+) Poor Dentition, Missing, Chipped, Dental Advisory Given   Pulmonary neg pulmonary ROS, neg sleep apnea, neg COPD, Patient abstained from smoking.Not current smoker, former smoker,    Pulmonary exam normal breath sounds clear to auscultation       Cardiovascular Exercise Tolerance: Good METS(-) hypertension+ Peripheral Vascular Disease  (-) CAD and (-) Past MI negative cardio ROS  (-) dysrhythmias  Rhythm:Regular Rate:Normal - Systolic murmurs    Neuro/Psych negative neurological ROS  negative psych ROS   GI/Hepatic negative GI ROS, Neg liver ROS, neg GERD  ,(+)     (-) substance abuse  ,   Endo/Other  negative endocrine ROSneg diabetes  Renal/GU negative Renal ROS     Musculoskeletal  (+) Arthritis ,   Abdominal   Peds negative pediatric ROS (+)  Hematology negative hematology ROS (+)   Anesthesia Other Findings History reviewed. No pertinent past medical history.  Reproductive/Obstetrics                             Anesthesia Physical  Anesthesia Plan  ASA: II  Anesthesia Plan: Spinal   Post-op Pain Management:    Induction: Intravenous  PONV Risk Score and Plan: 3 and Ondansetron, Dexamethasone, Propofol infusion, TIVA and Midazolam  Airway Management Planned: Natural Airway and Nasal Cannula  Additional Equipment: None  Intra-op Plan:   Post-operative Plan:   Informed Consent: I have reviewed the patients History and Physical, chart, labs and discussed the procedure including the risks, benefits and alternatives for the  proposed anesthesia with the patient or authorized representative who has indicated his/her understanding and acceptance.     Interpreter used for [employer] Discussed with: CRNA and Surgeon  Anesthesia Plan Comments: (Discussed R/B/A of neuraxial anesthesia technique with patient: - rare risks of spinal/epidural hematoma, nerve damage, infection - Risk of PDPH - Risk of nausea and vomiting - Risk of conversion to general anesthesia and its associated risks, including sore throat, damage to lips/teeth/oropharynx, and rare risks such as cardiac and respiratory events.  Patient voiced understanding.)        Anesthesia Quick Evaluation

## 2020-07-03 NOTE — Transfer of Care (Signed)
Immediate Anesthesia Transfer of Care Note  Patient: [name]  Procedure(s) Performed: COMPUTER ASSISTED TOTAL KNEE ARTHROPLASTY (Right Knee)  Patient Location: PACU  Anesthesia Type:Spinal  Level of Consciousness: awake, alert  and oriented  Airway & Oxygen Therapy: Patient Spontanous Breathing and Patient connected to face mask oxygen  Post-op Assessment: Report given to RN and Post -op Vital signs reviewed and stable  Post vital signs: Reviewed and stable  Last Vitals:  Vitals Value Taken Time  BP 99/68 07/03/20 1547  Temp    Pulse 98 07/03/20 1549  Resp 15 07/03/20 1549  SpO2 100 % 07/03/20 1549  Vitals shown include unvalidated device data.  Last Pain:  Vitals:   07/03/20 0947  TempSrc: Oral  PainSc: 6       Patients Stated Pain Goal: 3 (07/03/20 0947)  Complications: No complications documented.

## 2020-07-03 NOTE — H&P (Signed)
ORTHOPAEDIC HISTORY & PHYSICAL Progress Notes Michelene Gardener, PA - 06/29/2020 8:00 AM EST [facility] Chief Complaint:   Chief Complaint  Patient presents with  . Knee Pain  H & P RIGHT KNEE   History of Present Illness:   [name] is a 64 y.o. male that presents to clinic today for his preoperative history and evaluation. Patient presents unaccompanied. The patient is scheduled to undergo a right total knee arthroplasty on 07/03/20 by Dr. Ernest Pine. His pain began many years ago. The pain is located along the medial and lateral aspects of the knee. He describes his pain as worse with weightbearing. He reports associated swelling and some giving way of the knee. He denies associated numbness or tingling, denies locking.   The patient's symptoms have progressed to the point that they decrease his quality of life. The patient has previously undergone conservative treatment including NSAIDS and injections to the knee without adequate control of his symptoms.  Denies history of lower back surgery, significant cardiac history, or history of blood clots. Patient underwent left total knee arthroplasty on 01/12/2020 without complication and is very pleased with the results.  Past Medical, Surgical, Family, Social History, Allergies, Medications:   Past Medical History:  Past Medical History:  Diagnosis Date  . Varicose veins of both lower extremities   Past Surgical History:  Past Surgical History:  Procedure Laterality Date  . Left total knee arthroplasty using computer-assisted navigation 01/12/2020  Dr Ernest Pine  . ORIF left distal radius Left 05/11/2013   Current Medications:  Current Outpatient Medications  Medication Sig Dispense Refill  . aspirin 81 MG EC tablet Take 81 mg by mouth once daily   No current facility-administered medications for this visit.   Allergies: No Known Allergies  Social History:  Social History    Socioeconomic History  . Marital status: Married  Spouse name: Abbey Chatters  . Number of children: 2  . Years of education: 5  . Highest education level: Not on file  Occupational History  . Occupation: Full-time- [employer]  Tobacco Use  . Smoking status: Former Smoker  Packs/day: 1.00  Years: 20.00  Pack years: 20.00  Types: Cigarettes  Quit date: 1998  Years since quitting: 23.9  . Smokeless tobacco: Never Used  Substance and Sexual Activity  . Alcohol use: Not on file  . Drug use: Never  . Sexual activity: Yes  Partners: Female  Other Topics Concern  . Not on file  Social History Narrative  . Not on file   Social Determinants of Health   Financial Resource Strain: Not on file  Food Insecurity: Not on file  Transportation Needs: Not on file  Physical Activity: Not on file  Stress: Not on file  Social Connections: Not on file  Housing Stability: Not on file   Family History:  Family History  Problem Relation Age of Onset  . Stroke Mother   Review of Systems:   A 10+ ROS was performed, reviewed, and the pertinent orthopaedic findings are documented in the HPI.   Physical Examination:   BP 110/70  Ht 177.8 cm (5\' 10" )  Wt 74.1 kg (163 lb 6.4 oz)  BMI 23.45 kg/m   Patient is a well-developed, well-nourished male in no acute distress. Patient has normal mood and affect. Patient is alert and oriented to person, place, and time.   HEENT: Atraumatic, normocephalic. Pupils equal and reactive to light. Extraocular motion intact. Noninjected sclera.  Cardiovascular:  Regular rate and rhythm, with no murmurs, rubs, or gallops. Distal pulses palpable.  Respiratory: Lungs clear to auscultation bilaterally.   Right Knee: Soft tissue swelling: mild Effusion: moderate Erythema: none Crepitance: moderate Tenderness: medial, lateral, patellar Alignment: relative varus Mediolateral laxity: medial pseudolaxity Posterior sag: negative Patellar tracking: Good  tracking without evidence of subluxation or tilt; patellar grind test is positive Atrophy: No significant atrophy.  Quadriceps tone was fair to good. Range of motion: 0/0/100 degrees  Sensation intact over the saphenous, lateral sural cutaneous, superficial fibular, and deep fibular nerve distributions.  Tests Performed/Reviewed:  X-rays  3 views of the right knee were obtained. Images reveal complete loss of medial and lateral compartment joint spaces with bone-on-bone contact noted. Complete loss of patellofemoral joint space with bone-on-bone contact is noted. No fractures or dislocations.  Impression:   ICD-10-CM  1. Primary osteoarthritis of right knee M17.11   Plan:   The patient has end-stage degenerative changes of the right knee. It was explained to the patient that the condition is progressive in nature. Having failed conservative treatment, the patient has elected to proceed with a total joint arthroplasty. The patient will undergo a total joint arthroplasty with Dr. Ernest Pine. The risks of surgery, including blood clot and infection, were discussed with the patient. Measures to reduce these risks, including the use of anticoagulation, perioperative antibiotics, and early ambulation were discussed. The importance of postoperative physical therapy was discussed with the patient. The patient elects to proceed with surgery. The patient is instructed to stop all blood thinners prior to surgery. The patient is instructed to call the hospital the day before surgery to learn of the proper arrival time.   Contact our office with any questions or concerns. Follow up as indicated, or sooner should any new problems arise, if conditions worsen, or if they are otherwise concerned.   Michelene Gardener, PA-C [facility] [address] [place], [place] [address] Phone: [phone]  This note was generated in part with voice recognition software and I  apologize for any typographical errors that were not detected and corrected.   Electronically signed by Michelene Gardener, PA at 06/29/2020 8:47 AM EST

## 2020-07-04 ENCOUNTER — Encounter: Payer: Self-pay | Admitting: Orthopedic Surgery

## 2020-07-04 NOTE — Evaluation (Signed)
Occupational Therapy Evaluation Patient Details Name: [name] MRN: [id number] DOB: 19-Sep-1955 Today's Date: 07/04/2020    History of Present Illness 64 y.o. male that presents to clinic today for his preoperative history and evaluation. Patient presents unaccompanied. The patient is s/p right total knee arthroplasty on 07/03/20 by Dr. Ernest Pine   Clinical Impression   Patient presenting with decreased I in self care, balance, functional mobility/transfer, endurance, and safety awareness. Use of stratus spanish interpreter for evaluation.  Patient reports being independent and living at home with wife and working full time PTA. Pt is familiar with polar care and verbalized how to utilize during this session to demonstrate understanding. Patient currently functioning at supervision - min guard and managing pain well this session. Pt will have family present 24/7 for supervision/assist as needed. Patient will benefit from acute OT to increase overall independence in the areas of ADLs, functional mobility, and safety awareness in order to safely discharge home with family.    Follow Up Recommendations  Supervision - Intermittent    Equipment Recommendations  None recommended by OT       Precautions / Restrictions Precautions Precautions: Knee;Fall Restrictions Weight Bearing Restrictions: Yes RLE Weight Bearing: Weight bearing as tolerated      Mobility Bed Mobility Overal bed mobility: Needs Assistance Bed Mobility: Supine to Sit     Supine to sit: Supervision     General bed mobility comments: min cuing for technique    Transfers Overall transfer level: Needs assistance Equipment used: Rolling walker (2 wheeled) Transfers: Sit to/from [employer] Sit to Stand: Supervision Stand pivot transfers: Supervision       General transfer comment: min cuing and supervision for safety    Balance Overall balance assessment: Needs  assistance Sitting-balance support: Feet supported Sitting balance-Leahy Scale: Normal Sitting balance - Comments: no LOB   Standing balance support: During functional activity Standing balance-Leahy Scale: Good                             ADL either performed or assessed with clinical judgement   ADL Overall ADL's : Needs assistance/impaired     Grooming: Wash/dry hands;Wash/dry face;Oral care;Sitting;Set up               Lower Body Dressing: Minimal assistance;Sit to/from stand   Toilet Transfer: Supervision/safety;Ambulation   Toileting- Clothing Manipulation and Hygiene: Min guard;Sit to/from stand       Functional mobility during ADLs: Supervision/safety;Rolling walker       Vision Patient Visual Report: No change from baseline              Pertinent Vitals/Pain Pain Assessment: 0-10 Pain Score: 4  Pain Location: r knee Pain Descriptors / Indicators: Aching;Discomfort;Dull Pain Intervention(s): Limited activity within patient's tolerance;Monitored during session;Repositioned;Ice applied     Hand Dominance Left   Extremity/Trunk Assessment Upper Extremity Assessment Upper Extremity Assessment: Overall WFL for tasks assessed   Lower Extremity Assessment Lower Extremity Assessment: Defer to PT evaluation   Cervical / Trunk Assessment Cervical / Trunk Assessment: Normal   Communication Communication Communication: Prefers language other than [place];Interpreter utilized;Other (comment) (stratus interpreter)   Cognition Arousal/Alertness: Awake/alert Behavior During Therapy: WFL for tasks assessed/performed Overall Cognitive Status: Within Functional Limits for tasks assessed  Home Living Family/patient expects to be discharged to:: Private residence Living Arrangements: Spouse/significant other Available Help at Discharge: Family;Available 24 hours/day Type of Home:  House Home Access: Stairs to enter [employer] of Steps: 2 Entrance Stairs-Rails: None Home Layout: One level     Bathroom Shower/Tub: Walk-in shower         Home Equipment: Environmental consultant - 2 wheels;Cane - single point;Bedside commode          Prior Functioning/Environment Level of Independence: Independent        Comments: pt is independent in ADL/IADL without AD, 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), is very active and 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        [employer] Problem List: Decreased strength;Pain;Decreased range of motion;Decreased activity tolerance;Decreased safety awareness;Increased edema;Impaired balance (sitting and/or standing)      OT Treatment/Interventions: Self-care/ADL training;Therapeutic exercise;Therapeutic activities;Energy conservation;DME and/or AE instruction;Patient/family education;Balance training;Manual therapy    OT Goals(Current goals can be found in the care plan section) Acute Rehab OT Goals Patient Stated Goal: to go home with wife OT Goal Formulation: With patient Time For Goal Achievement: 07/18/20 Potential to Achieve Goals: Good ADL Goals Pt Will Perform Grooming: with modified independence;standing Pt Will Perform Lower Body Dressing: with modified independence;sit to/from stand Pt Will Transfer to Toilet: with modified independence;ambulating Pt Will Perform Toileting - Clothing Manipulation and hygiene: with modified independence;sit to/from stand  OT Frequency: Min 2X/week   Barriers to D/C:    none known at this time          AM-PAC OT "6 Clicks" Daily Activity     Outcome Measure Help from another person eating meals?: None Help from another person taking care of personal grooming?: None Help from another person toileting, which includes using toliet, bedpan, or urinal?: A Little Help from another person bathing (including washing, rinsing, drying)?: A Little Help from another person to  put on and taking off regular upper body clothing?: None Help from another person to put on and taking off regular lower body clothing?: A Little 6 Click Score: 21   End of Session Equipment Utilized During Treatment: Rolling walker Nurse Communication: Mobility status  Activity Tolerance: Patient tolerated treatment well Patient left: in chair;with call bell/phone within reach;with chair alarm set  OT Visit Diagnosis: Muscle weakness (generalized) (M62.81);Pain Pain - Right/Left: Right Pain - part of body: Knee                Time: 3709-6438 OT Time Calculation (min): 21 min Charges:  OT General Charges $OT Visit: 1 Visit OT Evaluation $OT Eval Low Complexity: 1 Low OT Treatments $Self Care/Home Management : 8-22 mins  Jackquline Denmark, MS, OTR/L , CBIS ascom [phone]  07/04/20, 12:20 PM

## 2020-07-04 NOTE — Evaluation (Signed)
Physical Therapy Evaluation Patient Details Name: [name] MRN: [id number] DOB: Nov 03, 1955 Today's Date: 07/04/2020   History of Present Illness  Cameron Yuvan Medinger is a 64yoM who comes to [facility] on 12/20 for elective Rt TKA. Pt was here for Left TKA several months back.  Clinical Impression  Pt admitted with above diagnosis. Pt currently with functional limitations due to the deficits listed below (see "PT Problem List"). Upon entry, pt in bed, awake and agreeable to participate. The pt is alert, pleasant, interactive, and able to provide info regarding prior level of function, both in tolerance and independence. Patient's performance this date reveals decreased ability, independence, and tolerance in performing all basic mobility required for performance of activities of daily living. Pt requires additional DME, close physical assistance, and cues for safe participate in mobility. Pt educated on precautions, exercises, and gait training. Pt will benefit from skilled PT intervention to increase independence and safety with basic mobility in preparation for discharge to the venue listed below.       Follow Up Recommendations Follow surgeon's recommendation for DC plan and follow-up therapies;Supervision - Intermittent    Equipment Recommendations  None recommended by PT;Other (comment) (has all DME from prior TKA)    Recommendations for Other Services       Precautions / Restrictions Precautions Precautions: Knee;Fall Precaution Booklet Issued: No Restrictions Weight Bearing Restrictions: Yes RLE Weight Bearing: Weight bearing as tolerated      Mobility  Bed Mobility Overal bed mobility: Needs Assistance Bed Mobility: Supine to Sit     Supine to sit: Supervision     General bed mobility comments: in chair at entry    Transfers Overall transfer level: Needs assistance Equipment used: Rolling walker (2 wheeled) Transfers: Sit to/from Stand Sit to Stand:  Supervision Stand pivot transfers: Supervision       General transfer comment: min cuing and supervision for safety  Ambulation/Gait Ambulation/Gait assistance: Min guard Gait Distance (Feet): 190 Feet Assistive device: Rolling walker (2 wheeled) Gait Pattern/deviations: Antalgic Gait velocity: 0.8m/s   General Gait Details: safe use of RW  Stairs            Wheelchair Mobility    Modified Rankin (Stroke Patients Only)       Balance Overall balance assessment: Modified Independent;No apparent balance deficits (not formally assessed) Sitting-balance support: Feet supported Sitting balance-Leahy Scale: Normal Sitting balance - Comments: no LOB   Standing balance support: During functional activity Standing balance-Leahy Scale: Good                               Pertinent Vitals/Pain Pain Assessment: 0-10 Pain Score: 6  Pain Location: Rt knee during exercise Pain Descriptors / Indicators: Aching Pain Intervention(s): Premedicated before session    Home Living Family/patient expects to be discharged to:: Private residence Living Arrangements: Spouse/significant other Available Help at Discharge: Family;Available 24 hours/day Type of Home: House Home Access: Stairs to enter Entrance Stairs-Rails: None Entrance Stairs-Number of Steps: 2 Home Layout: One level Home Equipment: Walker - 2 wheels;Cane - single point;Bedside commode      Prior Function Level of Independence: Independent         Comments: pt is independent in ADL/IADL without AD, 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), is very active and 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     Higher education careers adviser   Dominant Hand: Left  Extremity/Trunk Assessment   Upper Extremity Assessment Upper Extremity Assessment: Overall WFL for tasks assessed    Lower Extremity Assessment Lower Extremity Assessment: Defer to PT evaluation    Cervical / Trunk  Assessment Cervical / Trunk Assessment: Normal  Communication   Communication: Prefers language other than [place];Interpreter utilized;Other (comment) Banker interpreter)  Cognition Arousal/Alertness: Awake/alert Behavior During Therapy: WFL for tasks assessed/performed Overall Cognitive Status: Within Functional Limits for tasks assessed                                        General Comments      Exercises Total Joint Exercises Ankle Circles/Pumps: AROM;Both;10 reps Quad Sets: AROM;Right;15 reps;Seated Hip ABduction/ADduction: AROM;Right;15 reps Straight Leg Raises: AROM;Right;10 reps Long Arc Quad: AROM;10 reps;Right Goniometric ROM: 17-78 degrees Right knee flexion P/ROM   Assessment/Plan    PT Assessment Patient needs continued PT services  PT Problem List Decreased strength;Decreased range of motion;Decreased activity tolerance;Decreased balance;Decreased mobility       PT Treatment Interventions DME instruction;Gait training;Stair training;Functional mobility training;Therapeutic activities;Therapeutic exercise;Patient/family education    PT Goals (Current goals can be found in the Care Plan section)  Acute Rehab PT Goals Patient Stated Goal: to go home with wife PT Goal Formulation: With patient Time For Goal Achievement: 07/18/20 Potential to Achieve Goals: Good    Frequency BID   Barriers to discharge        Co-evaluation               AM-PAC PT "6 Clicks" Mobility  Outcome Measure Help needed turning from your back to your side while in a flat bed without using bedrails?: None Help needed moving from lying on your back to sitting on the side of a flat bed without using bedrails?: None Help needed moving to and from a bed to a chair (including a wheelchair)?: A Little Help needed standing up from a chair using your arms (e.g., wheelchair or bedside chair)?: A Little Help needed to walk in hospital room?: A Little Help needed  climbing 3-5 steps with a railing? : A Little 6 Click Score: 20    End of Session Equipment Utilized During Treatment: Gait belt Activity Tolerance: Patient tolerated treatment well;No increased pain Patient left: in chair;with call bell/phone within reach;with chair alarm set Nurse Communication: Mobility status PT Visit Diagnosis: Difficulty in walking, not elsewhere classified (R26.2);Other abnormalities of gait and mobility (R26.89)    Time: 3086-5784 PT Time Calculation (min) (ACUTE ONLY): 24 min   Charges:   PT Evaluation $PT Eval Moderate Complexity: 1 Mod PT Treatments $Therapeutic Exercise: 8-22 mins        12:44 PM, 07/04/20 [name], PT, DPT Physical Therapist - [facility]  [phone] (ASCOM)    Nandika Stetzer C 07/04/2020, 12:42 PM

## 2020-07-04 NOTE — Discharge Summary (Addendum)
Physician Discharge Summary  Patient ID: [name] MRN: [id number] DOB/AGE: Jan 04, 1956 64 y.o.  Admit date: 07/03/2020 Discharge date: 07/05/2020  Admission Diagnoses:  Total knee replacement status [Z96.659]  Surgeries:Procedure(s):  Right total knee arthroplasty using computer-assisted navigation  SURGEON:  Jena Gauss. M.D.  ASSISTANT: Baldwin Jamaica, PA-C (present and scrubbed throughout the case, critical for assistance with exposure, retraction, instrumentation, and closure)  ANESTHESIA: spinal  ESTIMATED BLOOD LOSS: 50 mL  FLUIDS REPLACED: 2400 mL of crystalloid  TOURNIQUET TIME: 107 minutes  DRAINS: 2 medium Hemovac  SOFT TISSUE RELEASES: Anterior cruciate ligament, posterior cruciate ligament, deep medial collateral ligament, patellofemoral ligament  IMPLANTS UTILIZED: DePuy Attune size 7 posterior stabilized femoral component (cemented), size 7 rotating platform tibial component (cemented), 41 mm medialized dome patella (cemented), and a 12 mm stabilized rotating platform polyethylene insert.  Discharge Diagnoses: Patient Active Problem List   Diagnosis Date Noted  . Total knee replacement status 07/03/2020  . Status post total left knee replacement 01/12/2020  . Primary osteoarthritis of both knees 11/07/2019  . Primary osteoarthritis of right knee 11/07/2019  . Varicose veins of lower extremities with ulcer (HCC) 01/23/2018  . Lower limb ulcer, ankle, left, with fat layer exposed (HCC) 01/23/2018  . Swelling of limb 01/23/2018  . Distal radius fracture 07/01/2013  . Pelvic fracture (HCC) 07/01/2013    History reviewed. No pertinent past medical history.   Transfusion: n/a   Consultants (if any):   Discharged Condition: Improved  Hospital Course: [name] is an 64 y.o. male who was admitted 07/03/2020 with a diagnosis of right knee osteoarthritis and went to the operating room on 07/03/2020 and underwent right total knee  arthroplasty. The patient received perioperative antibiotics for prophylaxis (see below). The patient tolerated the procedure well and was transported to PACU in stable condition. After meeting PACU criteria, the patient was subsequently transferred to the Orthopaedics/Rehabilitation unit.   The patient received DVT prophylaxis in the form of early mobilization, Lovenox, Foot Pumps and TED hose. A sacral pad had been placed and heels were elevated off of the bed with rolled towels in order to protect skin integrity. Foley catheter was discontinued on postoperative day #0. Wound drains were discontinued on postoperative day #2. The surgical incision was healing well without signs of infection.  Physical therapy was initiated postoperatively for transfers, gait training, and strengthening. Occupational therapy was initiated for activities of daily living and evaluation for assisted devices. Rehabilitation goals were reviewed in detail with the patient. The patient made steady progress with physical therapy and physical therapy recommended discharge to Home.   The patient achieved the preliminary goals of this hospitalization and was felt to be medically and orthopaedically appropriate for discharge.  He was given perioperative antibiotics:  Anti-infectives (From admission, onward)   Start     Dose/Rate Route Frequency Ordered Stop   07/03/20 1730  ceFAZolin (ANCEF) IVPB 2g/100 mL premix        2 g 200 mL/hr over 30 Minutes Intravenous Every 6 hours 07/03/20 1642 07/04/20 0037   07/03/20 1030  ceFAZolin (ANCEF) IVPB 2g/100 mL premix        2 g 200 mL/hr over 30 Minutes Intravenous  Once 07/03/20 1018 07/03/20 1203   07/03/20 0958  ceFAZolin (ANCEF) 2-4 GM/100ML-% IVPB       Note to Pharmacy: Agnes Lawrence  : cabinet override      07/03/20 0958 07/03/20 1154   07/03/20 0600  ceFAZolin (ANCEF) IVPB 2g/100 mL  premix  Status:  Discontinued        2 g 200 mL/hr over 30 Minutes Intravenous On call  to O.R. 07/02/20 2315 07/03/20 0174    .  Recent vital signs:  Vitals:   07/04/20 2318 07/05/20 0338  BP: 114/79 100/74  Pulse: 82 73  Resp: 16 16  Temp: 97.9 F (36.6 C) 97.7 F (36.5 C)  SpO2: 100% 99%    Recent laboratory studies:  No results for input(s): WBC, HGB, HCT, PLT, K, CL, CO2, BUN, CREATININE, GLUCOSE, CALCIUM, LABPT, INR in the last 72 hours.  Diagnostic Studies: DG Knee Right Port  Result Date: 07/03/2020 CLINICAL DATA:  Status post total knee arthroplasty. EXAM: PORTABLE RIGHT KNEE - 1-2 VIEW COMPARISON:  None. FINDINGS: The patient has undergone total knee arthroplasty on the right. The alignment appears near anatomic. There are expected postsurgical changes. Skin staples are noted. There is no unexpected radiopaque foreign body. A surgical drain is noted. IMPRESSION: Status post right total knee arthroplasty with expected postsurgical changes. Electronically Signed   By: Katherine Mantle M.D.   On: 07/03/2020 16:17    Discharge Medications:   Allergies as of 07/05/2020   No Known Allergies     Medication List    TAKE these medications   celecoxib 200 MG capsule Commonly known as: CELEBREX Take 1 capsule (200 mg total) by mouth 2 (two) times daily.   enoxaparin 40 MG/0.4ML injection Commonly known as: LOVENOX Inject 0.4 mLs (40 mg total) into the skin daily for 14 days.   oxyCODONE 5 MG immediate release tablet Commonly known as: Oxy IR/ROXICODONE Take 1 tablet (5 mg total) by mouth every 4 (four) hours as needed for moderate pain (pain score 4-6).   traMADol 50 MG tablet Commonly known as: ULTRAM Take 1 tablet (50 mg total) by mouth every 4 (four) hours as needed for moderate pain.            Durable Medical Equipment  (From admission, onward)         Start     Ordered   07/03/20 1643  DME Walker rolling  Once       Question:  Patient needs a walker to treat with the following condition  Answer:  Total knee replacement status    07/03/20 1642   07/03/20 1643  DME Bedside commode  Once       Question:  Patient needs a bedside commode to treat with the following condition  Answer:  Total knee replacement status   07/03/20 1642          Disposition: home with home health PT      Follow-up Information    Myrtis Ser On 07/18/2020.   Specialty: Orthopedic Surgery Why: at 9:15am Contact information: [address] [facility] [place] [address] [phone]        Donato Heinz, MD On 08/15/2020.   Specialty: Orthopedic Surgery Why: at 2:30pm Contact information: [address] [facility] [address] [facility] [place] [address] [phone]                Lasandra Beech, PA-C 07/05/2020, 7:03 AM    \

## 2020-07-04 NOTE — Anesthesia Postprocedure Evaluation (Signed)
Anesthesia Post Note  Patient: [name]  Procedure(s) Performed: COMPUTER ASSISTED TOTAL KNEE ARTHROPLASTY (Right Knee)  Patient location during evaluation: Nursing Unit Anesthesia Type: Spinal Level of consciousness: oriented and awake and alert Pain management: pain level controlled Vital Signs Assessment: post-procedure vital signs reviewed and stable Respiratory status: spontaneous breathing and respiratory function stable Cardiovascular status: blood pressure returned to baseline and stable Postop Assessment: no headache, no backache, no apparent nausea or vomiting and patient able to bend at knees Anesthetic complications: no   No complications documented.   Last Vitals:  Vitals:   07/04/20 0411 07/04/20 0741  BP: 104/79 116/74  Pulse: 66 67  Resp: 18 16  Temp: 36.6 C 36.8 C  SpO2: 100% 98%    Last Pain:  Vitals:   07/04/20 0741  TempSrc: Oral  PainSc:                  Rosanne Gutting

## 2020-07-04 NOTE — TOC Initial Note (Addendum)
Transition of Care [facility]) - Initial/Assessment Note    Patient Details  Name: [name] MRN: [id number] Date of Birth: 1956-01-22  [facility] CM/SW Contact:    Magnus Ivan, LCSW Phone Number: 07/04/2020, 11:55 AM  Clinical Narrative:              CSW met with patient at bedside using Interpreter tabley, Interpreter # [phone]. Patient reported he lives with his wife and his daughter takes him to appointments. Reports his family is supportive and knows how to care from him since he had the same procedure recently. Patient could not remember his PCP but says he is up to date with them. Per chart review, PCP is Dr. Carrie Mew. Patient reported he uses Product/process development scientist. Patient reported he has all DME needed from having his other knee replaced a few months ago. Patient confirmed he plans to use [facility] for HHPT. Patient is on [facility]'s list for services. Patient denied any needs prior to DC.     Expected Discharge Plan: [place] Barriers to Discharge: Continued Medical Work up   Patient Goals and CMS Choice Patient states their goals for this hospitalization and ongoing recovery are:: home with home health services CMS Medicare.gov Compare Post Acute Care list provided to:: Patient Choice offered to / list presented to : Patient  Expected Discharge Plan and Services Expected Discharge Plan: [place]       Living arrangements for the past 2 months: Single Family Home                           HH Arranged: PT [place] Agency: [facility] (formerly [employer])        Prior Living Arrangements/Services Living arrangements for the past 2 months: [place] with:: Spouse Patient language and need for interpreter reviewed:: Yes (Interpreter needed) Do you feel safe going back to the place where you live?: Yes      Need for Family Participation in Patient Care: Yes (Comment) Care giver support  system in place?: Yes (comment) Current home services: DME Criminal Activity/Legal Involvement Pertinent to Current Situation/Hospitalization: No - Comment as needed  Activities of Daily Living Home Assistive Devices/Equipment: Walker (specify type),Cane (specify quad or straight) ADL Screening (condition at time of admission) Patient's cognitive ability adequate to safely complete daily activities?: Yes Is the patient deaf or have difficulty hearing?: No Does the patient have difficulty seeing, even when wearing glasses/contacts?: No Does the patient have difficulty concentrating, remembering, or making decisions?: No Patient able to express need for assistance with ADLs?: Yes Does the patient have difficulty dressing or bathing?: No Independently performs ADLs?: Yes (appropriate for developmental age) Does the patient have difficulty walking or climbing stairs?: Yes Weakness of Legs: Right Weakness of Arms/Hands: None  Permission Sought/Granted Permission sought to share information with : Chartered certified accountant granted to share information with : Yes, Verbal Permission Granted     Permission granted to share info w AGENCY: [facility]        Emotional Assessment       Orientation: : Oriented to Self,Oriented to Place,Oriented to  Time,Oriented to Situation Alcohol / Substance Use: Not Applicable Psych Involvement: No (comment)  Admission diagnosis:  Total knee replacement status [Z96.659] Patient Active Problem List   Diagnosis Date Noted  . Total knee replacement status 07/03/2020  . Status post total left knee replacement 01/12/2020  .  Primary osteoarthritis of both knees 11/07/2019  . Primary osteoarthritis of right knee 11/07/2019  . Varicose veins of lower extremities with ulcer ([place]) 01/23/2018  . Lower limb ulcer, ankle, left, with fat layer exposed ([place]) 01/23/2018  . Swelling of limb 01/23/2018  . Distal radius fracture 07/01/2013  . Pelvic  fracture ([place]) 07/01/2013   PCP:  Marinda Elk, MD Pharmacy:   [facility] [address]), [place]) [place] [address] Phone: [phone] Fax: [phone]     Social Determinants of Health (SDOH) Interventions    Readmission Risk Interventions No flowsheet data found.

## 2020-07-04 NOTE — Progress Notes (Addendum)
Physical Therapy Treatment Patient Details Name: [name] MRN: [id number] DOB: [birth year] Today's Date: 07/04/2020    History of Present Illness Cameron Armani Brar is a 64yoM who comes to [facility] on 12/20 for elective Rt TKA. Pt was here for Left TKA several months back.    PT Comments    Pt is seen for 2nd daily treatment. Authro reviews full supine HEP with patient in bed and handout. Pt needs no assist to perform, is familiar with exercises from prior surgery. Pt AMB around the unit again. No assist needed for transfers. Pt ahead of typical postoperative path for strength, mobility, independence. Will plan on stair performance in morning.     Follow Up Recommendations  Follow surgeon's recommendation for DC plan and follow-up therapies;Supervision - Intermittent     Equipment Recommendations  None recommended by PT;Other (comment)    Recommendations for Other Services       Precautions / Restrictions Precautions Precautions: Knee;Fall Precaution Booklet Issued: Yes (comment) Restrictions Weight Bearing Restrictions: No RLE Weight Bearing: Weight bearing as tolerated    Mobility  Bed Mobility Overal bed mobility: Modified Independent Bed Mobility: Supine to Sit;Sit to Supine     Supine to sit: Modified independent (Device/Increase time) Sit to supine: Modified independent (Device/Increase time)   General bed mobility comments: in chair at entry  Transfers Overall transfer level: Needs assistance Equipment used: Rolling walker (2 wheeled) Transfers: Sit to/from Stand Sit to Stand: Supervision            Ambulation/Gait Ambulation/Gait assistance: Min guard Gait Distance (Feet): 190 Feet Assistive device: Rolling walker (2 wheeled) Gait Pattern/deviations: Antalgic Gait velocity: 0.35m/s   General Gait Details: safe use of RW noted; given cues for symmetry   Stairs             Wheelchair Mobility    Modified Rankin (Stroke Patients  Only)       Balance Overall balance assessment: Modified Independent;No apparent balance deficits (not formally assessed)                                          Cognition Arousal/Alertness: Awake/alert Behavior During Therapy: WFL for tasks assessed/performed Overall Cognitive Status: Within Functional Limits for tasks assessed                                        Exercises Total Joint Exercises Ankle Circles/Pumps: AROM;Both;15 reps;Supine Quad Sets: AROM;Right;15 reps;Supine Short Arc Quad: AROM;10 reps;Right;Supine Heel Slides: Supine;AAROM;Right;15 reps;Limitations Heel Slides Limitations: pt performs with gait belt Hip ABduction/ADduction: AROM;Right;15 reps;Supine Straight Leg Raises: AROM;Right;10 reps Long Arc Quad: AROM;10 reps;Right;Supine Goniometric ROM: 17-78 degrees Right knee flexion P/ROM    General Comments        Pertinent Vitals/Pain Pain Assessment: 0-10 Pain Score: 5  Pain Location: Rt knee during exercise/AMB Pain Descriptors / Indicators: Aching;Operative site guarding Pain Intervention(s): Limited activity within patient's tolerance    Home Living                      Prior Function            PT Goals (current goals can now be found in the care plan section) Acute Rehab PT Goals Patient Stated Goal: to go home with wife PT Goal Formulation: With  patient Time For Goal Achievement: 07/18/20 Potential to Achieve Goals: Good Progress towards PT goals: Progressing toward goals    Frequency    BID      PT Plan Current plan remains appropriate    Co-evaluation              AM-PAC PT "6 Clicks" Mobility   Outcome Measure  Help needed turning from your back to your side while in a flat bed without using bedrails?: None Help needed moving from lying on your back to sitting on the side of a flat bed without using bedrails?: None Help needed moving to and from a bed to a chair  (including a wheelchair)?: A Little Help needed standing up from a chair using your arms (e.g., wheelchair or bedside chair)?: A Little Help needed to walk in hospital room?: A Little Help needed climbing 3-5 steps with a railing? : A Little 6 Click Score: 20    End of Session Equipment Utilized During Treatment: Gait belt Activity Tolerance: Patient tolerated treatment well;No increased pain Patient left: with call bell/phone within reach;with chair alarm set;in bed Nurse Communication: Mobility status PT Visit Diagnosis: Difficulty in walking, not elsewhere classified (R26.2);Other abnormalities of gait and mobility (R26.89)     Time: 2094-7096 PT Time Calculation (min) (ACUTE ONLY): 23 min  Charges:  $Gait Training: 8-22 mins $Therapeutic Exercise: 8-22 mins                     3:17 PM, 07/04/20 [name], PT, DPT Physical Therapist - [facility]  [phone] (ASCOM)    Maylyn Narvaiz C 07/04/2020, 3:13 PM

## 2020-07-04 NOTE — Progress Notes (Signed)
  Subjective: 1 Day Post-Op Procedure(s) (LRB): COMPUTER ASSISTED TOTAL KNEE ARTHROPLASTY (Right) Patient reports pain as well-controlled.   Patient is well, and has had no acute complaints or problems Plan is to go Home after hospital stay. Negative for chest pain and shortness of breath Fever: no Gastrointestinal: negative for nausea and vomiting.   Patient has not had a bowel movement.  Objective: Vital signs in last 24 hours: Temp:  [97.1 F (36.2 C)-98.2 F (36.8 C)] 98.2 F (36.8 C) (12/21 0741) Pulse Rate:  [63-107] 67 (12/21 0741) Resp:  [13-18] 16 (12/21 0741) BP: (90-122)/(68-81) 116/74 (12/21 0741) SpO2:  [98 %-100 %] 98 % (12/21 0741) Weight:  [76.7 kg] 76.7 kg (12/20 0947)  Intake/Output from previous day:  Intake/Output Summary (Last 24 hours) at 07/04/2020 0816 Last data filed at 07/04/2020 0514 Gross per 24 hour  Intake 3790.53 ml  Output 3955 ml  Net -164.47 ml    Intake/Output this shift: No intake/output data recorded.  Labs: No results for input(s): HGB in the last 72 hours. No results for input(s): WBC, RBC, HCT, PLT in the last 72 hours. No results for input(s): NA, K, CL, CO2, BUN, CREATININE, GLUCOSE, CALCIUM in the last 72 hours. No results for input(s): LABPT, INR in the last 72 hours.   EXAM General - Patient is Alert, Appropriate and Oriented Extremity - Neurovascular intact Dorsiflexion/Plantar flexion intact Compartment soft Dressing/Incision -Postoperative dressing remains in place., Polar Care in place and working. , Hemovac in place.  Motor Function - intact, moving foot and toes well on exam. Able to perform SLR with ease, though Hemovac did come disconnected with some blood draining onto sheets Cardiovascular- Regular rate and rhythm, no murmurs/rubs/gallops Respiratory- Lungs clear to auscultation bilaterally Gastrointestinal- soft, nontender and active bowel sounds   Assessment/Plan: 1 Day Post-Op Procedure(s)  (LRB): COMPUTER ASSISTED TOTAL KNEE ARTHROPLASTY (Right) Active Problems:   Total knee replacement status  Estimated body mass index is 24.97 kg/m as calculated from the following:   Height as of this encounter: 5\' 9"  (1.753 m).   Weight as of this encounter: 76.7 kg. Advance diet Up with therapy Plan for discharge tomorrow  Hemovac reconnected. Spoke with nursing about changing sheets   DVT Prophylaxis - Lovenox, Ted hose and foot pumps Weight-Bearing as tolerated to right leg  Patient also seen with Dr. with Ernest Pine acting as interpreter.  Henrine Screws, PA-C [facility]opaedic Surgery 07/04/2020, 8:16 AM

## 2020-07-05 ENCOUNTER — Inpatient Hospital Stay: Payer: BC Managed Care – PPO

## 2020-07-05 MED ORDER — TRAMADOL HCL 50 MG PO TABS: 50.0000 mg | ORAL_TABLET | ORAL | 0 refills | Status: AC | PRN

## 2020-07-05 MED ORDER — CELECOXIB 200 MG PO CAPS: 200.0000 mg | ORAL_CAPSULE | Freq: Two times a day (BID) | ORAL | 0 refills | Status: AC

## 2020-07-05 MED ORDER — OXYCODONE HCL 5 MG PO TABS: 5.0000 mg | ORAL_TABLET | ORAL | 0 refills | Status: AC | PRN

## 2020-07-05 MED ORDER — ENOXAPARIN SODIUM 40 MG/0.4ML ~~LOC~~ SOLN: 40.0000 mg | SUBCUTANEOUS | 0 refills | Status: AC

## 2020-07-05 NOTE — Progress Notes (Addendum)
  Subjective: 2 Days Post-Op Procedure(s) (LRB): COMPUTER ASSISTED TOTAL KNEE ARTHROPLASTY (Right) Patient reports pain as well-controlled.   Patient is well, and has had no acute complaints or problems Plan is to go Home after hospital stay. Negative for chest pain and shortness of breath Fever: no Gastrointestinal: negative for nausea and vomiting.   Patient has not had a bowel movement.   Nurse, mental health service utilized*  Objective: Vital signs in last 24 hours: Temp:  [97.6 F (36.4 C)-98.8 F (37.1 C)] 97.7 F (36.5 C) (12/22 0338) Pulse Rate:  [62-82] 73 (12/22 0338) Resp:  [16-17] 16 (12/22 0338) BP: (100-116)/(74-80) 100/74 (12/22 0338) SpO2:  [98 %-100 %] 99 % (12/22 0338)  Intake/Output from previous day:  Intake/Output Summary (Last 24 hours) at 07/05/2020 0659 Last data filed at 07/05/2020 0548 Gross per 24 hour  Intake 840 ml  Output 1830 ml  Net -990 ml    Intake/Output this shift: Total I/O In: -  Out: 1390 [Urine:1300; Drains:90]  Labs: No results for input(s): HGB in the last 72 hours. No results for input(s): WBC, RBC, HCT, PLT in the last 72 hours. No results for input(s): NA, K, CL, CO2, BUN, CREATININE, GLUCOSE, CALCIUM in the last 72 hours. No results for input(s): LABPT, INR in the last 72 hours.   EXAM General - Patient is Alert, Appropriate and Oriented Extremity - Neurovascular intact Dorsiflexion/Plantar flexion intact Compartment soft and tender with  positive Homan's Dressing/Incision -clean, dry, no drainage, Postoperative dressing remains in place., Polar Care in place and working. , Hemovac in place.  Motor Function - intact, moving foot and toes well on exam.  Cardiovascular- Regular rate and rhythm, no murmurs/rubs/gallops Respiratory- Lungs clear to auscultation bilaterally Gastrointestinal- soft, nontender and active bowel sounds   Assessment/Plan: 2 Days Post-Op Procedure(s) (LRB): COMPUTER ASSISTED TOTAL KNEE ARTHROPLASTY  (Right) Active Problems:   Total knee replacement status  Estimated body mass index is 24.97 kg/m as calculated from the following:   Height as of this encounter: 5\' 9"  (1.753 m).   Weight as of this encounter: 76.7 kg. Advance diet Up with therapy Discharge home with home health pending completion of PT goals and BM.  Hemovac removed. Mini compression dressing applied.  DVT Prophylaxis - Lovenox, Ted hose and foot pumps Weight-Bearing as tolerated to right leg  , PA-C [facility] 07/05/2020, 6:59 AM

## 2020-07-05 NOTE — Progress Notes (Signed)
Physical Therapy Treatment Patient Details Name: [name] MRN: [id number] DOB: [birth year] Today's Date: 07/05/2020    History of Present Illness [name] is a 64yoM who comes to [facility] on 12/20 for elective Rt TKA. Pt was here for Left TKA several months back.    PT Comments    [name] in bed upon entry, author coordinated presession meds and allowed for meal completion prior to session. Pt reports able to perform HEP in bed last night without problem. Pain is 2/10 after meds. Pt progresses AMB to >226ft, given more cues for continuous step-through gait, as his pattern is mor antalgic today compared to yesterday. Pt performs 4 stairs with minimal difficulty, needs cues. Bed mobility, transfers, and HEP performed with modified independence. Pt has completed all therapy goals, reports his son will come to pick him up at 1:30 today. Will check in prior to DC to assess any additional needs.     Follow Up Recommendations  Follow surgeon's recommendation for DC plan and follow-up therapies;Supervision - Intermittent     Equipment Recommendations  None recommended by PT;Other (comment)    Recommendations for Other Services       Precautions / Restrictions Precautions Precautions: Knee;Fall Precaution Booklet Issued: Yes (comment) Restrictions RLE Weight Bearing: Weight bearing as tolerated    Mobility  Bed Mobility Overal bed mobility: Modified Independent Bed Mobility: Supine to Sit     Supine to sit: Modified independent (Device/Increase time) Sit to supine: Modified independent (Device/Increase time)   General bed mobility comments: in chair at entry  Transfers Overall transfer level: Needs assistance Equipment used: Rolling walker (2 wheeled) Transfers: Sit to/from Stand Sit to Stand: Supervision            Ambulation/Gait Ambulation/Gait assistance: Supervision Gait Distance (Feet): 270 Feet Assistive device: Rolling walker (2 wheeled) Gait  Pattern/deviations: Antalgic Gait velocity: 0.21m/s (0.6m/s yesterday)   General Gait Details: safe use of RW noted; given cues for symmetry   Stairs Stairs: Yes Stairs assistance: Min guard Stair Management: Two rails;Step to pattern Number of Stairs: 4 General stair comments: moving well, verbal cues for technique   Wheelchair Mobility    Modified Rankin (Stroke Patients Only)       Balance                                            Cognition Arousal/Alertness: Awake/alert Behavior During Therapy: WFL for tasks assessed/performed Overall Cognitive Status: Within Functional Limits for tasks assessed                                        Exercises Total Joint Exercises Heel Slides: Supine;AAROM;Right;15 reps;Limitations Heel Slides Limitations: pt performs with gait belt Goniometric ROM: 18-74 degrees Right knee flexion A/ROM (more stiffness into flexion today)    General Comments        Pertinent Vitals/Pain Pain Assessment: 0-10 Pain Score: 2  Pain Descriptors / Indicators: Aching;Operative site guarding Pain Intervention(s): Monitored during session;Premedicated before session;Repositioned    Home Living                      Prior Function            PT Goals (current goals can now be found in the care plan  section) Acute Rehab PT Goals Patient Stated Goal: to go home with wife PT Goal Formulation: With patient Time For Goal Achievement: 07/18/20 Potential to Achieve Goals: Good Progress towards PT goals: Progressing toward goals    Frequency    BID      PT Plan Current plan remains appropriate    Co-evaluation              AM-PAC PT "6 Clicks" Mobility   Outcome Measure  Help needed turning from your back to your side while in a flat bed without using bedrails?: None Help needed moving from lying on your back to sitting on the side of a flat bed without using bedrails?: None Help  needed moving to and from a bed to a chair (including a wheelchair)?: A Little Help needed standing up from a chair using your arms (e.g., wheelchair or bedside chair)?: A Little Help needed to walk in hospital room?: A Little Help needed climbing 3-5 steps with a railing? : A Little 6 Click Score: 20    End of Session Equipment Utilized During Treatment: Gait belt Activity Tolerance: Patient tolerated treatment well;No increased pain Patient left: with call bell/phone within reach;with chair alarm set;in bed Nurse Communication: Mobility status PT Visit Diagnosis: Difficulty in walking, not elsewhere classified (R26.2);Other abnormalities of gait and mobility (R26.89)     Time: 0258-5277 PT Time Calculation (min) (ACUTE ONLY): 20 min  Charges:  $Gait Training: 8-22 mins                     9:59 AM, 07/05/20 [name], PT, DPT Physical Therapist - [facility]  [phone] (ASCOM)    [name] 07/05/2020, 9:56 AM

## 2020-07-05 NOTE — Progress Notes (Signed)
Prn suppository effective patient had Medium BM , discharged home with discharge instructions and educated on where to pick up his medications / Daughter picked patient up . Transferred via w/c to personal vehicle

## 2021-03-15 IMAGING — US US EXTREM LOW VENOUS*R*
1 series · 14 of 24 positions shown · non-contrast
Comparison: None

CLINICAL DATA: RIGHT leg swelling, RIGHT knee replacement yesterday

EXAM:
RIGHT LOWER EXTREMITY VENOUS DOPPLER ULTRASOUND
TECHNIQUE: Gray-scale sonography with compression, as well as color and duplex
ultrasound, were performed to evaluate the deep venous system(s)
from the level of the common femoral vein through the popliteal and
proximal calf veins.

[Series 1: us venous img lower uni right (dvt) · portal-venous · 14 of 33 slices shown]
[im 1/33]
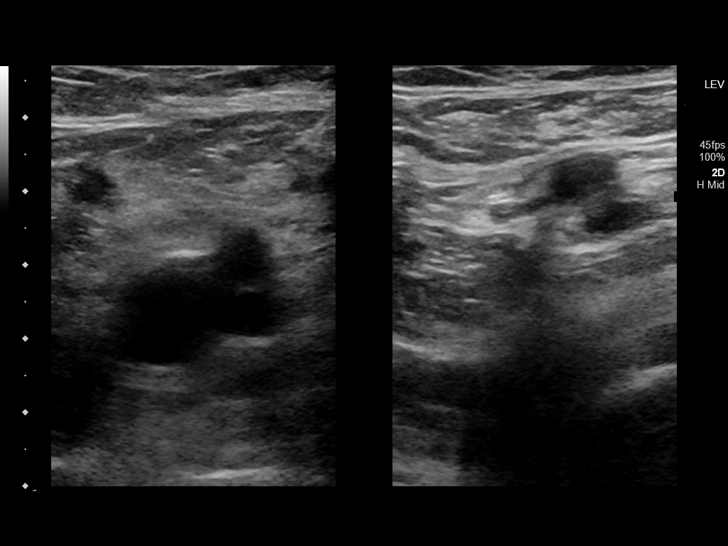
[im 3/33]
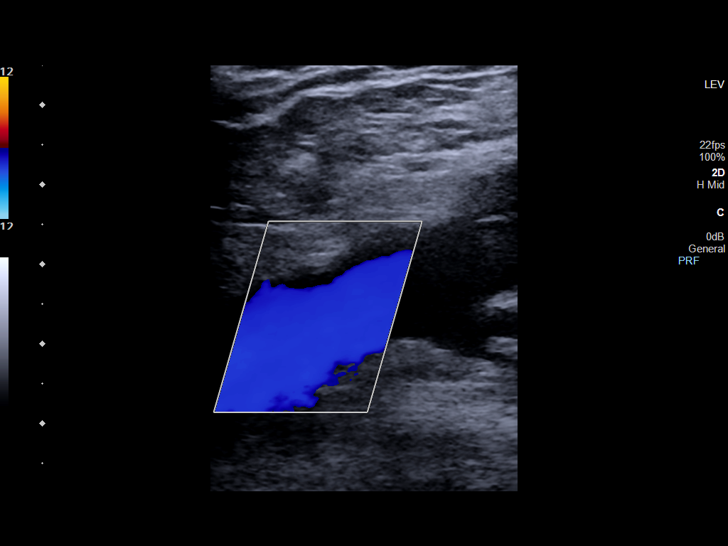
[im 6/33]
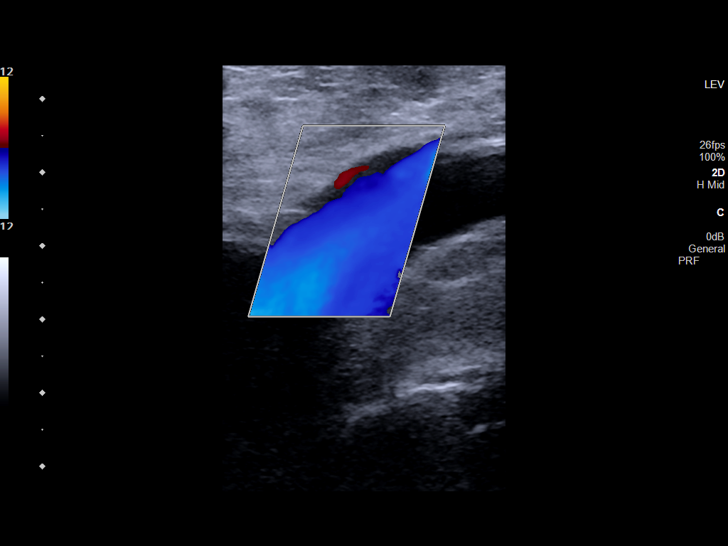
[im 9/33]
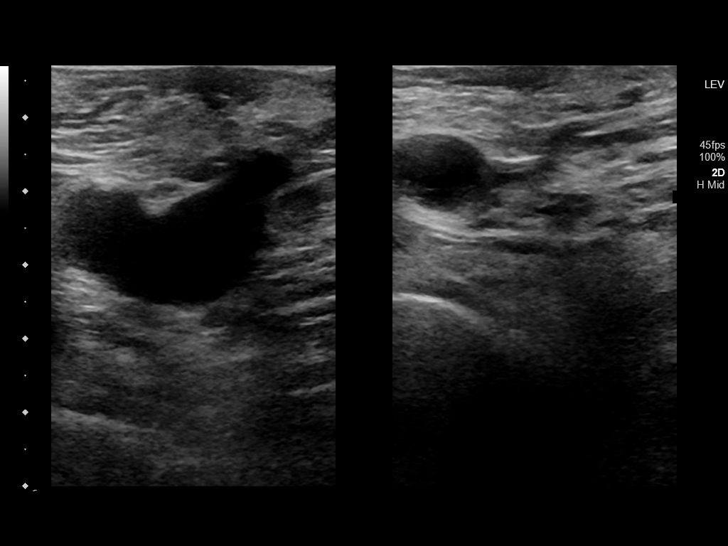
[im 10/33]
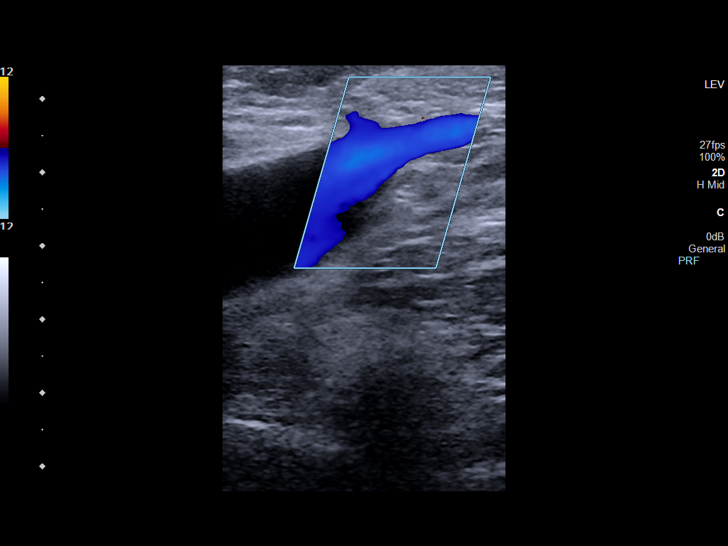
[im 13/33]
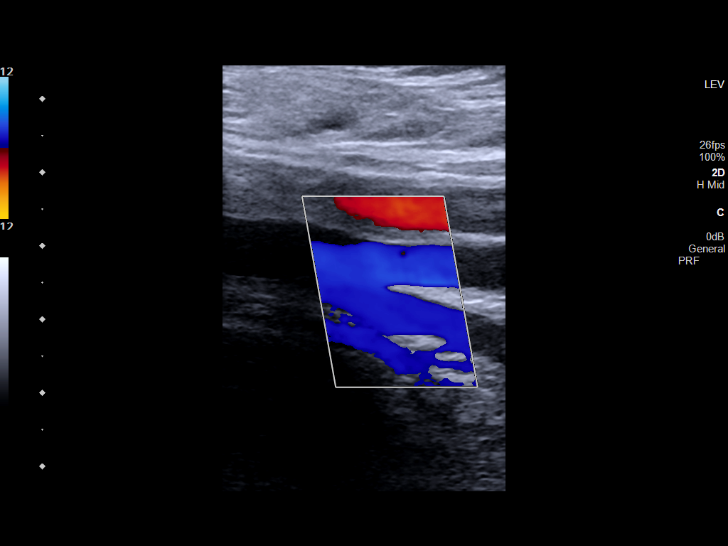
[im 16/33]
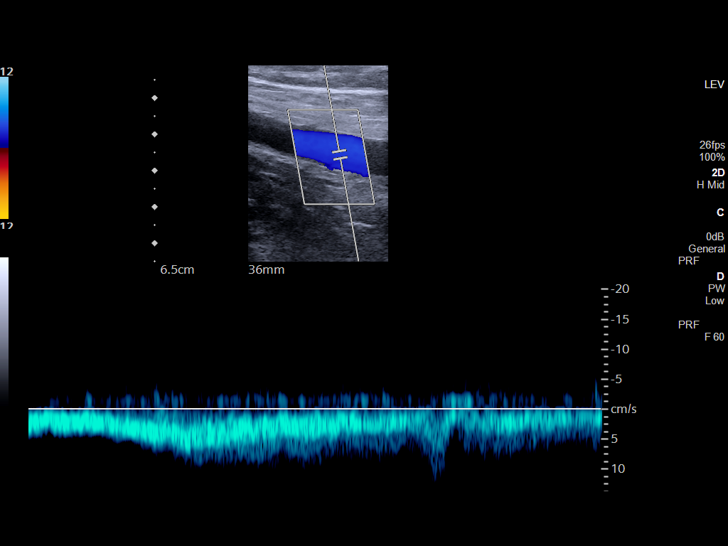
[im 17/33]
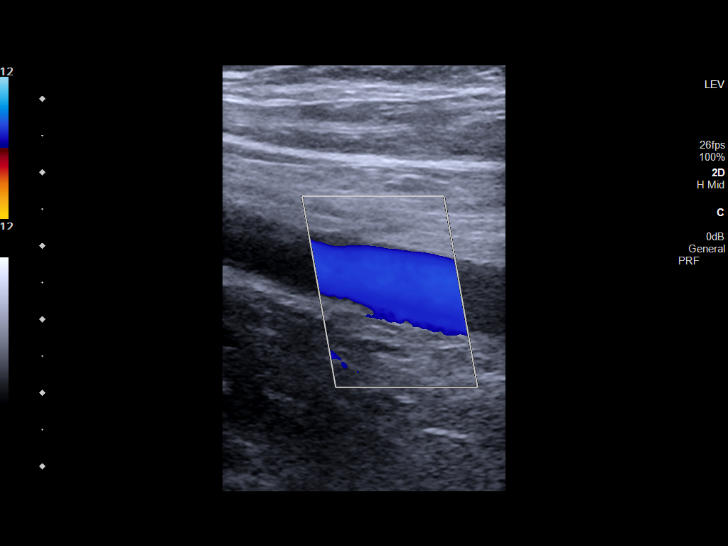
[im 20/33]
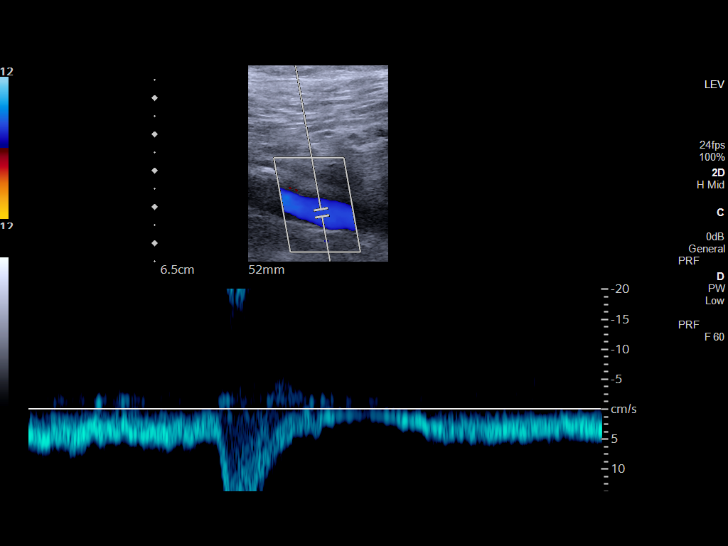
[im 23/33]
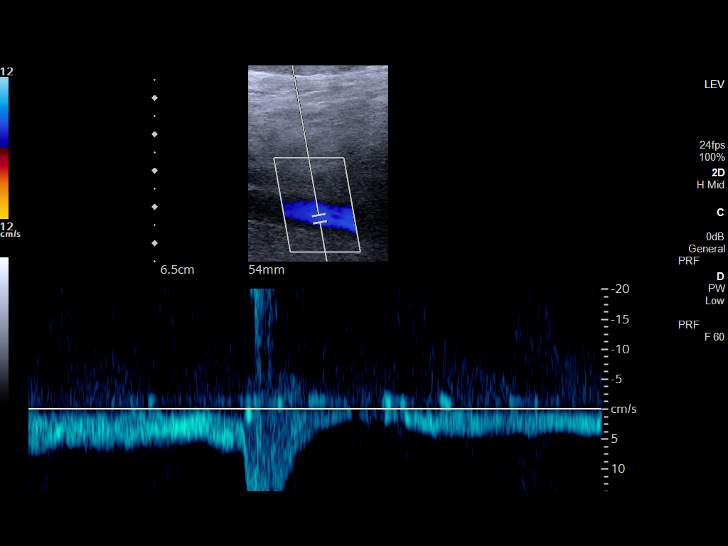
[im 26/33]
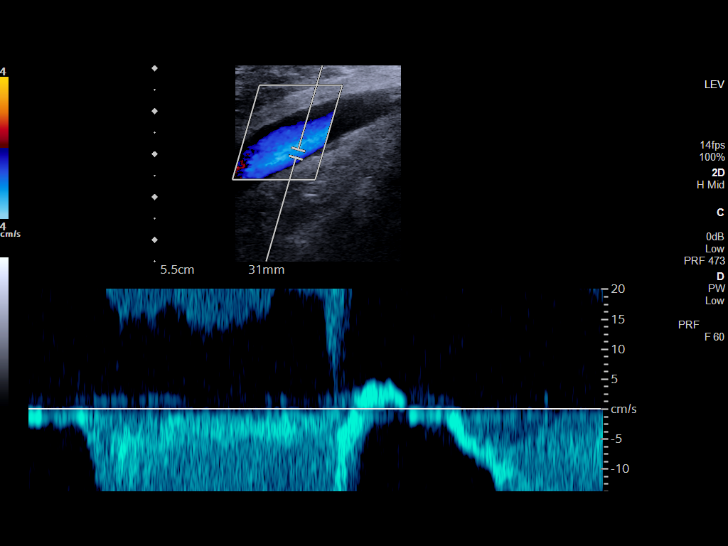
[im 27/33]
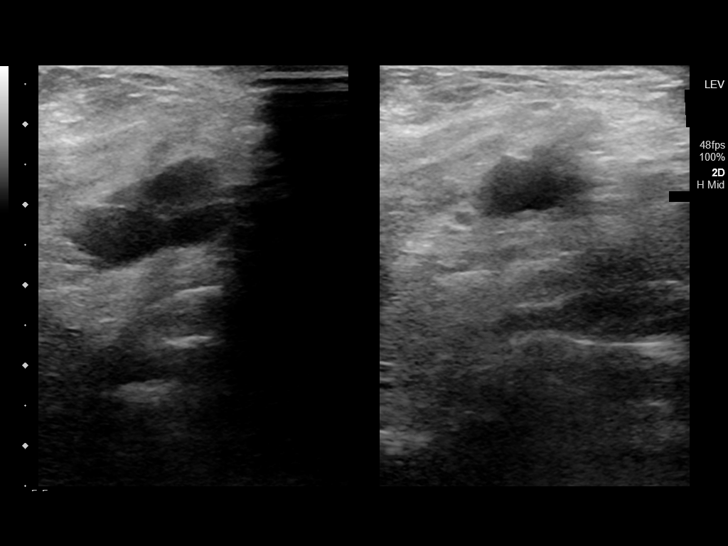
[im 30/33]
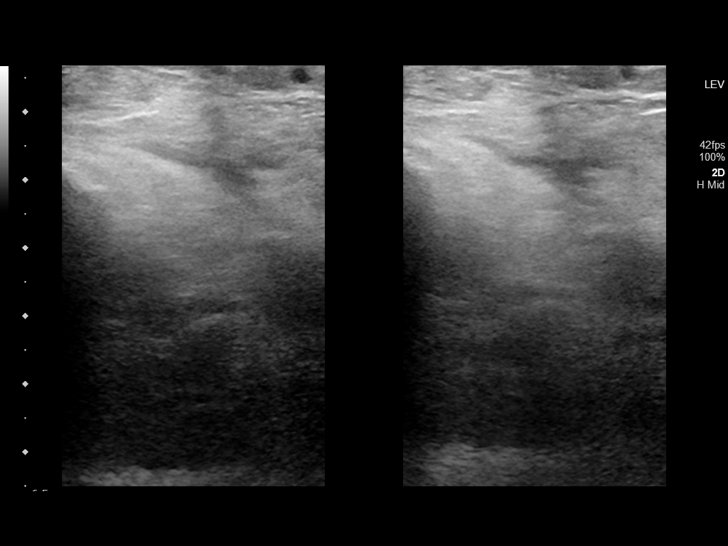
[im 33/33]
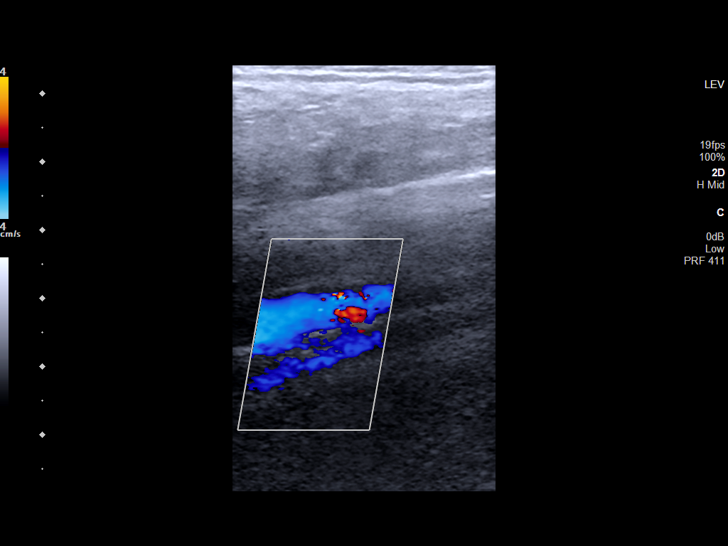

[14 of 24 positions shown; findings below may reference images not displayed]

FINDINGS: VENOUS

Normal compressibility of the common femoral, superficial femoral,
and popliteal veins, as well as the visualized calf veins.
Visualized portions of profunda femoral vein and great saphenous
vein unremarkable. No filling defects to suggest DVT on grayscale or
color Doppler imaging. Doppler waveforms show normal direction of
venous flow, normal respiratory plasticity and response to
augmentation.

Limited views of the contralateral common femoral vein are
unremarkable.

OTHER

None.

Limitations: none
IMPRESSION: No evidence of deep venous thrombosis in the RIGHT lower extremity.
